# Patient Record
Sex: Female | Born: 1935 | Race: White | Hispanic: No | Smoking: Former smoker
Health system: Southern US, Community
[De-identification: ages and names within clinical notes are randomized; demographics above are authoritative.]

## PROBLEM LIST (undated history)

## (undated) DIAGNOSIS — C801 Malignant (primary) neoplasm, unspecified: Secondary | ICD-10-CM

## (undated) DIAGNOSIS — J189 Pneumonia, unspecified organism: Secondary | ICD-10-CM

## (undated) DIAGNOSIS — E785 Hyperlipidemia, unspecified: Secondary | ICD-10-CM

## (undated) DIAGNOSIS — J449 Chronic obstructive pulmonary disease, unspecified: Secondary | ICD-10-CM

## (undated) DIAGNOSIS — R55 Syncope and collapse: Secondary | ICD-10-CM

## (undated) DIAGNOSIS — R9431 Abnormal electrocardiogram [ECG] [EKG]: Secondary | ICD-10-CM

## (undated) DIAGNOSIS — I5189 Other ill-defined heart diseases: Secondary | ICD-10-CM

## (undated) DIAGNOSIS — D649 Anemia, unspecified: Secondary | ICD-10-CM

## (undated) DIAGNOSIS — E119 Type 2 diabetes mellitus without complications: Secondary | ICD-10-CM

## (undated) DIAGNOSIS — H3500 Unspecified background retinopathy: Secondary | ICD-10-CM

## (undated) DIAGNOSIS — E039 Hypothyroidism, unspecified: Secondary | ICD-10-CM

## (undated) DIAGNOSIS — I499 Cardiac arrhythmia, unspecified: Secondary | ICD-10-CM

## (undated) DIAGNOSIS — I6529 Occlusion and stenosis of unspecified carotid artery: Secondary | ICD-10-CM

## (undated) DIAGNOSIS — R51 Headache: Secondary | ICD-10-CM

## (undated) DIAGNOSIS — K51411 Inflammatory polyps of colon with rectal bleeding: Secondary | ICD-10-CM

## (undated) DIAGNOSIS — H9193 Unspecified hearing loss, bilateral: Secondary | ICD-10-CM

## (undated) DIAGNOSIS — I219 Acute myocardial infarction, unspecified: Secondary | ICD-10-CM

## (undated) DIAGNOSIS — R0602 Shortness of breath: Secondary | ICD-10-CM

## (undated) DIAGNOSIS — R42 Dizziness and giddiness: Secondary | ICD-10-CM

## (undated) DIAGNOSIS — IMO0002 Reserved for concepts with insufficient information to code with codable children: Secondary | ICD-10-CM

## (undated) HISTORY — PX: TONSILLECTOMY: SUR1361

## (undated) HISTORY — DX: Reserved for concepts with insufficient information to code with codable children: IMO0002

## (undated) HISTORY — PX: CAROTID ENDARTERECTOMY: SUR193

## (undated) HISTORY — DX: Unspecified background retinopathy: H35.00

## (undated) HISTORY — PX: HEMORRHOID SURGERY: SHX153

## (undated) HISTORY — DX: Chronic obstructive pulmonary disease, unspecified: J44.9

## (undated) HISTORY — PX: THROAT SURGERY: SHX803

## (undated) HISTORY — DX: Hyperlipidemia, unspecified: E78.5

## (undated) HISTORY — PX: ABDOMINAL HYSTERECTOMY: SHX81

## (undated) HISTORY — PX: COLONOSCOPY W/ POLYPECTOMY: SHX1380

## (undated) HISTORY — DX: Unspecified hearing loss, bilateral: H91.93

## (undated) HISTORY — DX: Anemia, unspecified: D64.9

## (undated) HISTORY — DX: Other ill-defined heart diseases: I51.89

## (undated) HISTORY — DX: Malignant (primary) neoplasm, unspecified: C80.1

## (undated) HISTORY — DX: Inflammatory polyps of colon with rectal bleeding: K51.411

## (undated) HISTORY — DX: Occlusion and stenosis of unspecified carotid artery: I65.29

## (undated) HISTORY — DX: Hypothyroidism, unspecified: E03.9

## (undated) HISTORY — DX: Type 2 diabetes mellitus without complications: E11.9

## (undated) HISTORY — PX: OTHER SURGICAL HISTORY: SHX169

## (undated) HISTORY — DX: Cardiac arrhythmia, unspecified: I49.9

## (undated) HISTORY — PX: EYE SURGERY: SHX253

---

## 2004-09-16 DIAGNOSIS — I219 Acute myocardial infarction, unspecified: Secondary | ICD-10-CM

## 2004-09-16 HISTORY — DX: Acute myocardial infarction, unspecified: I21.9

## 2006-03-28 ENCOUNTER — Ambulatory Visit: Payer: Self-pay | Admitting: Family Medicine

## 2006-04-11 ENCOUNTER — Ambulatory Visit: Payer: Self-pay | Admitting: Family Medicine

## 2006-04-16 ENCOUNTER — Ambulatory Visit: Payer: Self-pay | Admitting: Internal Medicine

## 2006-04-24 ENCOUNTER — Ambulatory Visit: Payer: Self-pay | Admitting: Internal Medicine

## 2006-04-30 ENCOUNTER — Ambulatory Visit: Payer: Self-pay | Admitting: Family Medicine

## 2006-04-30 LAB — CONVERTED CEMR LAB: Hgb A1c MFr Bld: 6.2 %

## 2006-06-24 DIAGNOSIS — J45909 Unspecified asthma, uncomplicated: Secondary | ICD-10-CM | POA: Insufficient documentation

## 2006-06-24 DIAGNOSIS — N393 Stress incontinence (female) (male): Secondary | ICD-10-CM | POA: Insufficient documentation

## 2006-06-24 DIAGNOSIS — M810 Age-related osteoporosis without current pathological fracture: Secondary | ICD-10-CM | POA: Insufficient documentation

## 2006-06-24 DIAGNOSIS — I10 Essential (primary) hypertension: Secondary | ICD-10-CM | POA: Insufficient documentation

## 2006-06-24 DIAGNOSIS — E118 Type 2 diabetes mellitus with unspecified complications: Secondary | ICD-10-CM

## 2006-06-24 DIAGNOSIS — E039 Hypothyroidism, unspecified: Secondary | ICD-10-CM | POA: Insufficient documentation

## 2006-06-24 DIAGNOSIS — Z794 Long term (current) use of insulin: Secondary | ICD-10-CM | POA: Insufficient documentation

## 2006-06-24 DIAGNOSIS — I252 Old myocardial infarction: Secondary | ICD-10-CM | POA: Insufficient documentation

## 2006-06-24 DIAGNOSIS — H9190 Unspecified hearing loss, unspecified ear: Secondary | ICD-10-CM | POA: Insufficient documentation

## 2006-08-28 ENCOUNTER — Telehealth: Payer: Self-pay | Admitting: Family Medicine

## 2006-08-29 ENCOUNTER — Encounter: Payer: Self-pay | Admitting: Family Medicine

## 2006-10-01 ENCOUNTER — Telehealth: Payer: Self-pay | Admitting: Family Medicine

## 2006-10-01 ENCOUNTER — Ambulatory Visit: Payer: Self-pay | Admitting: Family Medicine

## 2006-10-02 ENCOUNTER — Telehealth (INDEPENDENT_AMBULATORY_CARE_PROVIDER_SITE_OTHER): Payer: Self-pay | Admitting: *Deleted

## 2006-10-02 ENCOUNTER — Encounter: Payer: Self-pay | Admitting: Family Medicine

## 2006-11-03 ENCOUNTER — Telehealth: Payer: Self-pay | Admitting: Family Medicine

## 2006-11-26 ENCOUNTER — Encounter: Payer: Self-pay | Admitting: Family Medicine

## 2006-12-04 ENCOUNTER — Ambulatory Visit: Payer: Self-pay | Admitting: Family Medicine

## 2006-12-04 DIAGNOSIS — I209 Angina pectoris, unspecified: Secondary | ICD-10-CM | POA: Insufficient documentation

## 2006-12-04 DIAGNOSIS — J449 Chronic obstructive pulmonary disease, unspecified: Secondary | ICD-10-CM | POA: Insufficient documentation

## 2006-12-08 ENCOUNTER — Telehealth (INDEPENDENT_AMBULATORY_CARE_PROVIDER_SITE_OTHER): Payer: Self-pay | Admitting: *Deleted

## 2007-01-18 ENCOUNTER — Encounter: Payer: Self-pay | Admitting: Family Medicine

## 2007-01-29 ENCOUNTER — Encounter: Payer: Self-pay | Admitting: Family Medicine

## 2007-02-04 ENCOUNTER — Encounter: Payer: Self-pay | Admitting: Family Medicine

## 2007-02-04 DIAGNOSIS — E113299 Type 2 diabetes mellitus with mild nonproliferative diabetic retinopathy without macular edema, unspecified eye: Secondary | ICD-10-CM | POA: Insufficient documentation

## 2007-02-12 ENCOUNTER — Encounter: Payer: Self-pay | Admitting: Family Medicine

## 2007-02-18 ENCOUNTER — Ambulatory Visit: Payer: Self-pay | Admitting: Family Medicine

## 2007-04-13 ENCOUNTER — Ambulatory Visit: Payer: Self-pay | Admitting: Family Medicine

## 2007-08-28 ENCOUNTER — Ambulatory Visit: Payer: Self-pay | Admitting: Family Medicine

## 2007-08-31 ENCOUNTER — Encounter: Payer: Self-pay | Admitting: Family Medicine

## 2007-09-01 ENCOUNTER — Encounter: Payer: Self-pay | Admitting: Family Medicine

## 2007-09-01 ENCOUNTER — Telehealth (INDEPENDENT_AMBULATORY_CARE_PROVIDER_SITE_OTHER): Payer: Self-pay | Admitting: *Deleted

## 2007-09-01 LAB — CONVERTED CEMR LAB
AST: 16 units/L (ref 0–37)
Alkaline Phosphatase: 72 units/L (ref 39–117)
BUN: 24 mg/dL — ABNORMAL HIGH (ref 6–23)
Creatinine, Ser: 1.15 mg/dL (ref 0.40–1.20)
HCT: 36.5 % (ref 36.0–46.0)
Hemoglobin: 11.6 g/dL — ABNORMAL LOW (ref 12.0–15.0)
MCHC: 31.8 g/dL (ref 30.0–36.0)
Platelets: 252 10*3/uL (ref 150–400)
Potassium: 4.7 meq/L (ref 3.5–5.3)
RDW: 13 % (ref 11.5–15.5)

## 2007-09-02 LAB — CONVERTED CEMR LAB: Ferritin: 32 ng/mL (ref 10–291)

## 2007-09-28 ENCOUNTER — Ambulatory Visit: Payer: Self-pay | Admitting: Family Medicine

## 2007-10-19 ENCOUNTER — Telehealth (INDEPENDENT_AMBULATORY_CARE_PROVIDER_SITE_OTHER): Payer: Self-pay | Admitting: *Deleted

## 2007-10-19 ENCOUNTER — Encounter: Payer: Self-pay | Admitting: Family Medicine

## 2007-10-21 ENCOUNTER — Encounter: Admission: RE | Admit: 2007-10-21 | Discharge: 2007-11-18 | Payer: Self-pay | Admitting: Family Medicine

## 2007-11-17 ENCOUNTER — Telehealth: Payer: Self-pay | Admitting: Family Medicine

## 2007-12-02 LAB — HM DIABETES EYE EXAM: HM Diabetic Eye Exam: NORMAL

## 2007-12-23 ENCOUNTER — Ambulatory Visit: Payer: Self-pay | Admitting: Family Medicine

## 2007-12-23 LAB — CONVERTED CEMR LAB: Hgb A1c MFr Bld: 6.8 %

## 2008-02-01 ENCOUNTER — Encounter: Payer: Self-pay | Admitting: Family Medicine

## 2008-02-04 ENCOUNTER — Telehealth: Payer: Self-pay | Admitting: Family Medicine

## 2008-02-12 ENCOUNTER — Encounter: Payer: Self-pay | Admitting: Family Medicine

## 2008-02-19 ENCOUNTER — Ambulatory Visit: Payer: Self-pay | Admitting: Family Medicine

## 2008-03-02 ENCOUNTER — Encounter (INDEPENDENT_AMBULATORY_CARE_PROVIDER_SITE_OTHER): Payer: Self-pay | Admitting: Surgery

## 2008-03-02 ENCOUNTER — Ambulatory Visit (HOSPITAL_COMMUNITY): Admission: RE | Admit: 2008-03-02 | Discharge: 2008-03-03 | Payer: Self-pay | Admitting: Surgery

## 2008-04-04 ENCOUNTER — Ambulatory Visit: Payer: Self-pay | Admitting: Family Medicine

## 2008-04-05 ENCOUNTER — Encounter: Payer: Self-pay | Admitting: Family Medicine

## 2008-04-13 ENCOUNTER — Ambulatory Visit: Payer: Self-pay | Admitting: Family Medicine

## 2008-04-13 DIAGNOSIS — R413 Other amnesia: Secondary | ICD-10-CM

## 2008-04-13 DIAGNOSIS — F0390 Unspecified dementia without behavioral disturbance: Secondary | ICD-10-CM | POA: Insufficient documentation

## 2008-04-15 ENCOUNTER — Telehealth (INDEPENDENT_AMBULATORY_CARE_PROVIDER_SITE_OTHER): Payer: Self-pay | Admitting: *Deleted

## 2008-04-15 ENCOUNTER — Encounter: Payer: Self-pay | Admitting: Family Medicine

## 2008-04-15 DIAGNOSIS — N183 Chronic kidney disease, stage 3 unspecified: Secondary | ICD-10-CM | POA: Insufficient documentation

## 2008-04-15 LAB — CONVERTED CEMR LAB
Albumin: 4.3 g/dL (ref 3.5–5.2)
Alkaline Phosphatase: 73 units/L (ref 39–117)
CO2: 24 meq/L (ref 19–32)
Chloride: 107 meq/L (ref 96–112)
Glucose, Bld: 46 mg/dL — ABNORMAL LOW (ref 70–99)
Potassium: 5.1 meq/L (ref 3.5–5.3)
RBC: 3.94 M/uL (ref 3.87–5.11)
Sodium: 141 meq/L (ref 135–145)
TSH: 2.063 microintl units/mL (ref 0.350–4.50)
Total Protein: 7.3 g/dL (ref 6.0–8.3)
UIBC: 260 ug/dL
Vitamin B-12: 558 pg/mL (ref 211–911)
WBC: 6.5 10*3/uL (ref 4.0–10.5)

## 2008-09-30 ENCOUNTER — Telehealth: Payer: Self-pay | Admitting: Family Medicine

## 2008-10-07 ENCOUNTER — Ambulatory Visit: Payer: Self-pay | Admitting: Family Medicine

## 2008-10-07 LAB — CONVERTED CEMR LAB: Glucose, Bld: 217 mg/dL

## 2008-10-17 ENCOUNTER — Encounter: Payer: Self-pay | Admitting: Family Medicine

## 2008-10-20 ENCOUNTER — Encounter: Payer: Self-pay | Admitting: Family Medicine

## 2008-11-22 ENCOUNTER — Encounter: Payer: Self-pay | Admitting: Family Medicine

## 2008-12-22 ENCOUNTER — Ambulatory Visit: Payer: Self-pay | Admitting: Family Medicine

## 2008-12-23 ENCOUNTER — Encounter: Payer: Self-pay | Admitting: Family Medicine

## 2009-01-27 ENCOUNTER — Encounter: Payer: Self-pay | Admitting: Family Medicine

## 2009-02-08 ENCOUNTER — Encounter: Payer: Self-pay | Admitting: Family Medicine

## 2009-02-24 ENCOUNTER — Ambulatory Visit: Payer: Self-pay | Admitting: Family Medicine

## 2009-02-27 LAB — CONVERTED CEMR LAB
ALT: 13 units/L (ref 0–35)
CO2: 24 meq/L (ref 19–32)
Cholesterol: 170 mg/dL (ref 0–200)
Hgb A1c MFr Bld: 6.2 % — ABNORMAL HIGH (ref 4.6–6.1)
LDL Cholesterol: 89 mg/dL (ref 0–99)
Sodium: 144 meq/L (ref 135–145)
Total Bilirubin: 0.3 mg/dL (ref 0.3–1.2)
Total Protein: 6.9 g/dL (ref 6.0–8.3)
VLDL: 22 mg/dL (ref 0–40)

## 2009-03-13 ENCOUNTER — Telehealth: Payer: Self-pay | Admitting: Family Medicine

## 2009-03-16 ENCOUNTER — Encounter: Payer: Self-pay | Admitting: Family Medicine

## 2009-03-27 ENCOUNTER — Ambulatory Visit: Payer: Self-pay | Admitting: Family Medicine

## 2009-03-28 LAB — CONVERTED CEMR LAB
Glucose, Bld: 276 mg/dL — ABNORMAL HIGH (ref 70–99)
Potassium: 5 meq/L (ref 3.5–5.3)
Sodium: 138 meq/L (ref 135–145)

## 2009-04-20 ENCOUNTER — Telehealth: Payer: Self-pay | Admitting: Family Medicine

## 2009-04-20 ENCOUNTER — Ambulatory Visit: Payer: Self-pay | Admitting: Family Medicine

## 2009-05-04 ENCOUNTER — Encounter: Payer: Self-pay | Admitting: Family Medicine

## 2009-05-30 ENCOUNTER — Ambulatory Visit: Payer: Self-pay | Admitting: Family Medicine

## 2009-05-30 LAB — CONVERTED CEMR LAB: Hgb A1c MFr Bld: 6.9 %

## 2009-07-18 ENCOUNTER — Telehealth: Payer: Self-pay | Admitting: Family Medicine

## 2009-09-14 ENCOUNTER — Telehealth: Payer: Self-pay | Admitting: Family Medicine

## 2009-09-28 ENCOUNTER — Encounter: Payer: Self-pay | Admitting: Family Medicine

## 2009-10-30 ENCOUNTER — Telehealth: Payer: Self-pay | Admitting: Family Medicine

## 2009-11-07 ENCOUNTER — Ambulatory Visit: Payer: Self-pay | Admitting: Family Medicine

## 2009-11-07 ENCOUNTER — Telehealth (INDEPENDENT_AMBULATORY_CARE_PROVIDER_SITE_OTHER): Payer: Self-pay | Admitting: *Deleted

## 2009-11-07 LAB — CONVERTED CEMR LAB
Bilirubin Urine: NEGATIVE
Glucose, Urine, Semiquant: NEGATIVE
Nitrite: NEGATIVE

## 2010-06-18 ENCOUNTER — Ambulatory Visit: Payer: Self-pay | Admitting: Family Medicine

## 2010-06-18 LAB — CONVERTED CEMR LAB
Creatinine,U: 300 mg/dL
Hgb A1c MFr Bld: 6.4 %
Microalbumin U total vol: 150 mg/L

## 2010-06-21 ENCOUNTER — Encounter: Payer: Self-pay | Admitting: Family Medicine

## 2010-06-21 LAB — CONVERTED CEMR LAB
AST: 21 units/L (ref 0–37)
Alkaline Phosphatase: 67 units/L (ref 39–117)
BUN: 39 mg/dL — ABNORMAL HIGH (ref 6–23)
Creatinine, Ser: 1.5 mg/dL — ABNORMAL HIGH (ref 0.40–1.20)
Glucose, Bld: 104 mg/dL — ABNORMAL HIGH (ref 70–99)
HDL: 58 mg/dL (ref >39)
LDL Cholesterol: 68 mg/dL (ref 0–99)
TSH: 1.72 microintl units/mL (ref 0.350–4.500)
Total CHOL/HDL Ratio: 2.5
Triglycerides: 103 mg/dL (ref <150)

## 2010-07-07 ENCOUNTER — Encounter: Payer: Self-pay | Admitting: Family Medicine

## 2010-07-12 ENCOUNTER — Ambulatory Visit: Payer: Self-pay | Admitting: Family Medicine

## 2010-07-12 DIAGNOSIS — R42 Dizziness and giddiness: Secondary | ICD-10-CM | POA: Insufficient documentation

## 2010-07-13 ENCOUNTER — Encounter: Payer: Self-pay | Admitting: Family Medicine

## 2010-07-13 LAB — CONVERTED CEMR LAB
BUN: 39 mg/dL — ABNORMAL HIGH (ref 6–23)
CO2: 24 meq/L (ref 19–32)
Calcium: 9.2 mg/dL (ref 8.4–10.5)
Creatinine, Ser: 1.86 mg/dL — ABNORMAL HIGH (ref 0.40–1.20)

## 2010-07-20 ENCOUNTER — Encounter: Payer: Self-pay | Admitting: Family Medicine

## 2010-07-25 ENCOUNTER — Telehealth: Payer: Self-pay | Admitting: Family Medicine

## 2010-08-03 ENCOUNTER — Encounter (INDEPENDENT_AMBULATORY_CARE_PROVIDER_SITE_OTHER): Payer: Self-pay | Admitting: *Deleted

## 2010-08-07 ENCOUNTER — Telehealth: Payer: Self-pay | Admitting: Family Medicine

## 2010-08-24 ENCOUNTER — Encounter: Payer: Self-pay | Admitting: Family Medicine

## 2010-08-28 ENCOUNTER — Encounter: Payer: Self-pay | Admitting: Family Medicine

## 2010-08-29 ENCOUNTER — Encounter: Payer: Self-pay | Admitting: Family Medicine

## 2010-09-14 ENCOUNTER — Encounter: Payer: Self-pay | Admitting: Family Medicine

## 2010-09-14 ENCOUNTER — Ambulatory Visit: Payer: Self-pay | Admitting: Family Medicine

## 2010-09-14 LAB — CONVERTED CEMR LAB
Protein, U semiquant: NEGATIVE
pH: 6.5

## 2010-09-15 ENCOUNTER — Encounter: Payer: Self-pay | Admitting: Family Medicine

## 2010-09-17 LAB — CONVERTED CEMR LAB
ALT: 25 units/L (ref 0–35)
AST: 27 units/L (ref 0–37)
Alkaline Phosphatase: 81 units/L (ref 39–117)
Basophils Absolute: 0.1 10*3/uL (ref 0.0–0.1)
Basophils Relative: 1 % (ref 0–1)
Creatinine, Ser: 1.66 mg/dL — ABNORMAL HIGH (ref 0.40–1.20)
Eosinophils Absolute: 0.2 10*3/uL (ref 0.0–0.7)
Eosinophils Relative: 2 % (ref 0–5)
HCT: 35.9 % — ABNORMAL LOW (ref 36.0–46.0)
Hemoglobin: 11.8 g/dL — ABNORMAL LOW (ref 12.0–15.0)
Lymphocytes Relative: 20 % (ref 12–46)
MCHC: 32.9 g/dL (ref 30.0–36.0)
MCV: 92.5 fL (ref 78.0–100.0)
Monocytes Absolute: 0.6 10*3/uL (ref 0.1–1.0)
Platelets: 221 10*3/uL (ref 150–400)
RDW: 12.5 % (ref 11.5–15.5)
Sodium: 137 meq/L (ref 135–145)
Total Bilirubin: 0.3 mg/dL (ref 0.3–1.2)
Total Protein: 6.5 g/dL (ref 6.0–8.3)

## 2010-09-19 ENCOUNTER — Telehealth: Payer: Self-pay | Admitting: Family Medicine

## 2010-09-21 ENCOUNTER — Ambulatory Visit
Admission: RE | Admit: 2010-09-21 | Discharge: 2010-09-21 | Payer: Self-pay | Source: Home / Self Care | Attending: Family Medicine | Admitting: Family Medicine

## 2010-09-21 DIAGNOSIS — I959 Hypotension, unspecified: Secondary | ICD-10-CM | POA: Insufficient documentation

## 2010-10-02 ENCOUNTER — Ambulatory Visit
Admission: RE | Admit: 2010-10-02 | Discharge: 2010-10-02 | Payer: Self-pay | Source: Home / Self Care | Attending: Family Medicine | Admitting: Family Medicine

## 2010-10-02 DIAGNOSIS — R079 Chest pain, unspecified: Secondary | ICD-10-CM | POA: Insufficient documentation

## 2010-10-08 ENCOUNTER — Telehealth (INDEPENDENT_AMBULATORY_CARE_PROVIDER_SITE_OTHER): Payer: Self-pay | Admitting: *Deleted

## 2010-10-16 ENCOUNTER — Encounter: Payer: Self-pay | Admitting: Family Medicine

## 2010-10-16 ENCOUNTER — Ambulatory Visit
Admission: RE | Admit: 2010-10-16 | Discharge: 2010-10-16 | Payer: Self-pay | Source: Home / Self Care | Attending: Family Medicine | Admitting: Family Medicine

## 2010-10-16 LAB — CONVERTED CEMR LAB: Hgb A1c MFr Bld: 8.1 %

## 2010-10-16 NOTE — Assessment & Plan Note (Signed)
Summary: Sinusitis, urinary freq.   Vital Signs:  Patient profile:   77 year old female Height:      63 inches Weight:      156 pounds BMI:     27.73 O2 Sat:      94 % on Room air Temp:     98.6 degrees F oral Pulse rate:   87 / minute BP sitting:   117 / 52  (left arm) Cuff size:   regular  Vitals Entered By: Kathlene November (November 07, 2009 3:16 PM)  O2 Flow:  Room air CC: head congestion, cough, H/A, chills and sweats, hurting in rib area when takes a deep breath- started Thursday   Primary Care Provider:  Nani Gasser MD  CC:  head congestion, cough, H/A, chills and sweats, and hurting in rib area when takes a deep breath- started Thursday.  History of Present Illness: head congestion, cough, H/A, chills and sweats, hurting in rib area when takes a deep breath- started Thursday.  Sxs started about 7 dats ago. Initally had green nasal discharge and now white d/c. No fever. Took some IBU and cepacol and diabetic cough medicine.  No GI sxs.  Mild ST.  No ear pain.  Horseness started today adn feels worse today.    Current Medications (verified): 1)  Advair Diskus 250-50 Mcg/dose Misc (Fluticasone-Salmeterol) .... One Inhalation Twice Daily 2)  Proair Hfa 108 (90 Base) Mcg/act Aers (Albuterol Sulfate) .... 2 -4 Puffs Inhaled Every 4 Hours As Needed Sob. 3)  Aspirin 81 Mg Tbec (Aspirin) .... Take 1 Tablet By Mouth Once A Day 4)  Atrovent Hfa 17 Mcg/act Aers (Ipratropium Bromide Hfa) .... 2 Puffs 4x's Daily 5)  Caltrate 600+d  Tabs (Calcium Carbonate-Vitamin D Tabs) .... Take 1 Tablet By Mouth Once A Day 6)  Glucotrol Xl 5 Mg Xr24h-Tab (Glipizide) .... Take 1 Tablet By Mouth Once A Day 7)  Lisinopril 10 Mg Tabs (Lisinopril) .... Take 1 Tablet By Mouth Once A Day 8)  Loratadine 10 Mg Tabs (Loratadine) .... Take 1 Tablet By Mouth Once A Day 9)  Multivitamins  Tabs (Multiple Vitamin) .... Take 1 Tablet By Mouth Once A Day 10)  Nitroglycerin 0.4 Mg Subl (Nitroglycerin) .... As  Needed Chest Pain 11)  Sertraline Hcl 100 Mg Tabs (Sertraline Hcl) .... Take 1 Tablet By Mouth Once A Day 12)  Synthroid 50 Mcg Tabs (Levothyroxine Sodium) .... Take 1 Tablet By Mouth Once A Day 13)  Triamcinolone Acetonide 0.1 % Crea (Triamcinolone Acetonide) .... Apply To Heals As Needed 14)  Tylenol Arthritis Pain 650 Mg Tbcr (Acetaminophen) .... As Needed 15)  Zocor 40 Mg Tabs (Simvastatin) .... One By Mouth At Bedtime 16)  True Track Glucometer and Test Strips .... Dx 250.00 Test 2 X A Day 17)  Albuterol Sulfate (2.5 Mg/44ml) 0.083% Nebu (Albuterol Sulfate) .... Neb Inhaled Every 4 Hours As Need For Sob 18)  Ferrous Sulfate 324 Mg  Tbec (Ferrous Sulfate) .[name].. 1 Tab By Mouth Daily 19)  Darvocet-N 100 100-650 Mg  Tabs (Propoxyphene N-Apap) .[name].. 1 Tab By Mouth Three Times A Day As Needed Back Pain 20)  Metformin Hcl 500 Mg Xr24h-Tab (Metformin Hcl) .... Take 1 Tablet By Mouth Once A Day 21)  Shower Chair .... Dx. Lower Extremity Weakness. 22)  Epipen 2-Pak 0.3 Mg/0.83ml (1:1000) Devi (Epinephrine Hcl (Anaphylaxis)) .... Use As Directed  Allergies (verified): 1)  ! Tetracycline  Comments:  Nurse/Medical Assistant: The patient's medications and allergies were reviewed with the  patient and were updated in the Medication and Allergy Lists. Kathlene November (November 07, 2009 3:17 PM)  Physical Exam  General:  Well-developed,well-nourished,in no acute distress; alert,appropriate and cooperative throughout examination Head:  Normocephalic and atraumatic without obvious abnormalities. No apparent alopecia or balding. Eyes:  No corneal or conjunctival inflammation noted. EOMI. Perrla.  Ears:  External ear exam shows no significant lesions or deformities.  Otoscopic examination reveals clear canals, tympanic membranes are intact bilaterally without bulging, retraction, inflammation or discharge. Hearing is grossly normal bilaterally. Nose:  External nasal examination shows no deformity or  inflammation. Nasal mucosa are pink and moist without lesions or exudates. Mouth:  Oral mucosa and oropharynx without lesions or exudates.  Teeth in good repair. Neck:  No deformities, masses, or tenderness noted. Lungs:  Normal respiratory effort, chest expands symmetrically. Lungs are clear to auscultation, no crackles or wheezes. Heart:  Normal rate and regular rhythm. S1 and S2 normal without gallop, murmur, click, rub or other extra sounds. Skin:  no rashes.   Cervical Nodes:  No lymphadenopathy noted Psych:  Cognition and judgment appear intact. Alert and cooperative with normal attention span and concentration. No apparent delusions, illusions, hallucinations   Impression & Recommendations:  Problem # 1:  SINUSITIS - ACUTE-NOS (ICD-461.9)  Instructed on treatment. Call if symptoms persist or worsen.   Her updated medication list for this problem includes:    Amoxicillin 500 Mg Cap (Amoxicillin) .[name]... Take 1 capsule by mouth three times a day x 10 days    Cipro 500 Mg Tabs (Ciprofloxacin hcl) .[name]... Take 1 tablet by mouth two times a day for 3 days  Problem # 2:  FREQUENCY, URINARY (ICD-788.41)  Orders: UA Dipstick w/o Micro (automated)  (81003)  Discussed use of medication. If sxs don't resolve then f/u for furthe evaluation for possible overactive bladder.   Complete Medication List: 1)  Advair Diskus 250-50 Mcg/dose Misc (Fluticasone-salmeterol) .... One inhalation twice daily 2)  Proair Hfa 108 (90 Base) Mcg/act Aers (Albuterol sulfate) .... 2 -4 puffs inhaled every 4 hours as needed sob. 3)  Aspirin 81 Mg Tbec (Aspirin) .... Take 1 tablet by mouth once a day 4)  Atrovent Hfa 17 Mcg/act Aers (Ipratropium bromide hfa) .... 2 puffs 4x's daily 5)  Caltrate 600+d Tabs (Calcium carbonate-vitamin d tabs) .... Take 1 tablet by mouth once a day 6)  Glucotrol Xl 5 Mg Xr24h-tab (Glipizide) .... Take 1 tablet by mouth once a day 7)  Lisinopril 10 Mg Tabs (Lisinopril) .... Take 1  tablet by mouth once a day 8)  Loratadine 10 Mg Tabs (Loratadine) .... Take 1 tablet by mouth once a day 9)  Multivitamins Tabs (Multiple vitamin) .... Take 1 tablet by mouth once a day 10)  Nitroglycerin 0.4 Mg Subl (Nitroglycerin) .... As needed chest pain 11)  Sertraline Hcl 100 Mg Tabs (Sertraline hcl) .... Take 1 tablet by mouth once a day 12)  Synthroid 50 Mcg Tabs (Levothyroxine sodium) .... Take 1 tablet by mouth once a day 13)  Triamcinolone Acetonide 0.1 % Crea (Triamcinolone acetonide) .... Apply to heals as needed 14)  Tylenol Arthritis Pain 650 Mg Tbcr (Acetaminophen) .... As needed 15)  Zocor 40 Mg Tabs (Simvastatin) .... One by mouth at bedtime 16)  True Track Glucometer and Test Strips  .... Dx 250.00 test 2 x a day 17)  Albuterol Sulfate (2.5 Mg/59ml) 0.083% Nebu (Albuterol sulfate) .... Neb inhaled every 4 hours as need for sob 18)  Ferrous Sulfate 324 Mg Tbec (  Ferrous sulfate) .[name].. 1 tab by mouth daily 19)  Darvocet-n 100 100-650 Mg Tabs (Propoxyphene n-apap) .[name].. 1 tab by mouth three times a day as needed back pain 20)  Metformin Hcl 500 Mg Xr24h-tab (Metformin hcl) .... Take 1 tablet by mouth once a day 21)  Paediatric nurse  .... Dx. lower extremity weakness. 22)  Epipen 2-pak 0.3 Mg/0.82ml (1:1000) Devi (Epinephrine hcl (anaphylaxis)) .... Use as directed 23)  Amoxicillin 500 Mg Cap (Amoxicillin) .... Take 1 capsule by mouth three times a day x 10 days 24)  Cipro 500 Mg Tabs (Ciprofloxacin hcl) .... Take 1 tablet by mouth two times a day for 3 days Prescriptions: CIPRO 500 MG TABS (CIPROFLOXACIN HCL) Take 1 tablet by mouth two times a day for 3 days  #6 x 0   Entered and Authorized by:   Nani Gasser MD   Signed by:   Nani Gasser MD on 11/07/2009   Method used:   Electronically to        [facility]* (retail)       [address]       [place], [place]  [address]       Ph: [phone]       Fax: [phone]   RxID:    [id number] AMOXICILLIN 500 MG CAP (AMOXICILLIN) Take 1 capsule by mouth three times a day X 10 days  #30 x 0   Entered and Authorized by:   Nani Gasser MD   Signed by:   Nani Gasser MD on 11/07/2009   Method used:   Electronically to        [facility]* (retail)       [address]       [place], [place]  [address]       Ph: [phone]       Fax: [phone]   RxID:   [id number]   Laboratory Results   Urine Tests  Date/Time Received: 11/07/2009 Date/Time Reported: 11/07/2009  Routine Urinalysis   Color: yellow Appearance: Clear Glucose: negative   (Normal Range: Negative) Bilirubin: negative   (Normal Range: Negative) Ketone: negative   (Normal Range: Negative) Spec. Gravity: 1.015   (Normal Range: 1.003-1.035) Blood: small   (Normal Range: Negative) pH: 5.5   (Normal Range: 5.0-8.0) Protein: negative   (Normal Range: Negative) Urobilinogen: 0.2   (Normal Range: 0-1) Nitrite: negative   (Normal Range: Negative) Leukocyte Esterace: moderate   (Normal Range: Negative)

## 2010-10-16 NOTE — Letter (Signed)
Summary: Generic Letter  [facility]  [address]   [place], [place] [address]   Phone: [phone]  Fax: [phone]    08/03/2010  [facility] [address] [place], [place]  [address]  Dear Theresa Huang,    We have been unable to reach you by phone. We have important information in reguards recent test that you have had done.Please call the office when you recieve this letter in the mail so that we may assist you with scheduling further appointment and so that we may update your contact information.       Sincerely,   Nani Gasser, MD

## 2010-10-16 NOTE — Medication Information (Signed)
Summary: Diabetes Supplies/[facility]  Diabetes Supplies/[facility]   Imported By: Lanelle Bal 11/01/2008 12:09:39  _____________________________________________________________________  External Attachment:    Type:   Image     Comment:   External Document

## 2010-10-16 NOTE — Progress Notes (Signed)
Summary: Carotid dopplers  Phone Note Outgoing Call   Summary of Call: Call pt: scan of neck did show some narrowing in the right carotid. I would like for CV surgeon to see her. [facility] prefer[facility] ([facility] Echo of heart is OK. Slight abnormal relaxtion of the heart but OK.  Valves are working well.  Initial call taken by: Nani Gasser MD,  July 25, 2010 1:32 PM  Follow-up for Phone Call        left message to call back Follow-up by: Avon Gully CMA, Duncan Dull),  July 26, 2010 8:31 AM  Additional Follow-up for Phone Call Additional follow up Details #1::        will have to mail pt a letter.the number on file was incorrect Additional Follow-up by: Avon Gully CMA, Duncan Dull),  August 03, 2010 2:14 PM        Past History:  Past Medical History: Degenerative Disc Disease HOSPitalized in 1/07-PNA Hx rectal polyps(non cancerous) and mixed hemorrhoids Left eye retinopathy (disc hemorrhage) Wears bilat hearing aids Glaucoma.  MMSE 7-09 27/30 (passing is 27/30) Echo 07/2010 diastolic dysfunction, EF 55%.  performed at [facility]

## 2010-10-16 NOTE — Progress Notes (Signed)
Summary: Sick  Phone Note Call from Patient Call back at [facility] Phone [phone] Call back at [phone]   Caller: Daughter Call For: Nani Gasser MD Summary of Call: Sick since Friday- cough, SOB, congestion head and chest, denies fever, H/A.  Initial call taken by: Kathlene November,  November 07, 2009 10:01 AM  Follow-up for Phone Call        See if can come in at 3:15.  Follow-up by: Nani Gasser MD,  November 07, 2009 11:20 AM

## 2010-10-16 NOTE — Letter (Signed)
Summary: [facility]  [facility]   Imported By: Lanelle Bal 01/22/2010 09:51:53  _____________________________________________________________________  External Attachment:    Type:   Image     Comment:   External Document

## 2010-10-16 NOTE — Progress Notes (Signed)
Summary: surgeon  Phone Note Call from Patient   Caller: Daughter Call For: Theresa Gasser MD Summary of Call: daughter recieved letter in the mail and called back. Pt needs to be set up for CV surgeon and does prefer [facility]. Initial call taken by: Avon Gully CMA, Duncan Dull),  August 07, 2010 4:48 PM

## 2010-10-16 NOTE — Progress Notes (Signed)
Summary: meds  Phone Note Call from Patient Call back at Home Phone [phone]   Caller: Daughter Call For: Semisi Biela Summary of Call: Pt daughter called and states that her mother took all doses of meds for the day and for today as well. Had diarrhea last night this morning feels ok. Asked her what blood sugar was this mornign and it was fine at 91 BP ok. So instruted pt to take nightly dose of meds as scheduled and continue tomorrow on her normal schedule Initial call taken by: Kathlene November,  October 30, 2009 10:30 AM  Follow-up for Phone Call        agree. Follow-up by: Seymour Bars DO,  October 30, 2009 11:28 AM

## 2010-10-16 NOTE — Assessment & Plan Note (Signed)
Summary: COPD, DM, etc   Vital Signs:  Patient profile:   77 year old female Height:      63 inches Weight:      163 pounds Pulse rate:   58 / minute BP sitting:   105 / 52  (right arm) Cuff size:   regular  Vitals Entered By: Avon Gully CMA, Duncan Dull) (June 18, 2010 2:07 PM) CC: f/u meds   Primary Care Provider:  Nani Gasser MD  CC:  f/u meds.  History of Present Illness: Has been SOB last 3 days. On her Adviar and has beeen using her inhaler 3-4 x a day. Has been wearing her oxygen during the day some.  No fever.  No cough or URI sxs. Duaghter says maybe a dry cough.  Got her flu shot 2 weeks ago.   Diabetes Management History:      The patient is a 77 years old female who comes in for evaluation of Type 2 Diabetes Mellitus.  She has not been enrolled in the "Diabetic Education Program".  She states understanding of dietary principles and is following her diet appropriately.  She is checking home blood sugars.  She says that she is exercising.  Type of exercise includes: walking.        There are no symptoms to suggest diabetic complications.  Since her last visit, no infections have occurred.  No changes have been made to her treatment plan since last visit.    Current Medications (verified): 1)  Advair Diskus 250-50 Mcg/dose Misc (Fluticasone-Salmeterol) .... One Inhalation Twice Daily 2)  Proair Hfa 108 (90 Base) Mcg/act Aers (Albuterol Sulfate) .... 2 -4 Puffs Inhaled Every 4 Hours As Needed Sob. 3)  Aspirin 81 Mg Tbec (Aspirin) .... Take 1 Tablet By Mouth Once A Day 4)  Atrovent Hfa 17 Mcg/act Aers (Ipratropium Bromide Hfa) .... 2 Puffs 4x's Daily 5)  Caltrate 600+d  Tabs (Calcium Carbonate-Vitamin D Tabs) .... Take 1 Tablet By Mouth Once A Day 6)  Glucotrol Xl 5 Mg Xr24h-Tab (Glipizide) .... Take 1 Tablet By Mouth Once A Day 7)  Lisinopril 10 Mg Tabs (Lisinopril) .... Take 1 Tablet By Mouth Once A Day 8)  Loratadine 10 Mg Tabs (Loratadine) .... Take 1 Tablet By  Mouth Once A Day 9)  Multivitamins  Tabs (Multiple Vitamin) .... Take 1 Tablet By Mouth Once A Day 10)  Nitroglycerin 0.4 Mg Subl (Nitroglycerin) .... As Needed Chest Pain 11)  Sertraline Hcl 100 Mg Tabs (Sertraline Hcl) .... Take 1 Tablet By Mouth Once A Day 12)  Synthroid 50 Mcg Tabs (Levothyroxine Sodium) .... Take 1 Tablet By Mouth Once A Day 13)  Triamcinolone Acetonide 0.1 % Crea (Triamcinolone Acetonide) .... Apply To Heals As Needed 14)  Tylenol Arthritis Pain 650 Mg Tbcr (Acetaminophen) .... As Needed 15)  Zocor 40 Mg Tabs (Simvastatin) .... One By Mouth At Bedtime 16)  True Track Glucometer and Test Strips .... Dx 250.00 Test 2 X A Day 17)  Albuterol Sulfate (2.5 Mg/76ml) 0.083% Nebu (Albuterol Sulfate) .... Neb Inhaled Every 4 Hours As Need For Sob 18)  Ferrous Sulfate 324 Mg  Tbec (Ferrous Sulfate) .[name].. 1 Tab By Mouth Daily 19)  Darvocet-N 100 100-650 Mg  Tabs (Propoxyphene N-Apap) .[name].. 1 Tab By Mouth Three Times A Day As Needed Back Pain 20)  Metformin Hcl 500 Mg Xr24h-Tab (Metformin Hcl) .... Take 1 Tablet By Mouth Once A Day 21)  Shower Chair .... Dx. Lower Extremity Weakness. 22)  Epipen  2-Pak 0.3 Mg/0.14ml (1:1000) Devi (Epinephrine Hcl (Anaphylaxis)) .... Use As Directed  Allergies (verified): 1)  ! Tetracycline  Comments:  Nurse/Medical Assistant: The patient's medications and allergies were reviewed with the patient and were updated in the Medication and Allergy Lists. Avon Gully CMA, Duncan Dull) (June 18, 2010 2:08 PM)  Past History:  Past Medical History: Last updated: 04/13/2008 Degenerative Disc Disease HOSPitalized in 1/07-PNA Hx rectal polyps(non cancerous) and mixed hemorrhoids Left eye retinopathy (disc hemorrhage) Wears bilat hearing aids Glaucoma.  MMSE 7-09 27/30 (passing is 27/30)  Social History: Last updated: 09/28/2007 Retired Lawyer.  Lives with dtr and son-in-law.  Moved from [facility] 4/07.  Quit smoking 1989, no EtOH, no drugs, 3 caffeinated/day,  no reg exercise.  Physical Exam  General:  Well-developed,well-nourished,in no acute distress; alert,appropriate and cooperative throughout examination Head:  Normocephalic and atraumatic without obvious abnormalities. No apparent alopecia or balding. Eyes:  No corneal or conjunctival inflammation noted. EOMI. Perrla.  Ears:  External ear exam shows no significant lesions or deformities.  Otoscopic examination reveals clear canals, tympanic membranes are intact bilaterally without bulging, retraction, inflammation or discharge. Hearing is grossly normal bilaterally. Nose:  External nasal examination shows no deformity or inflammation.  Mouth:  Oral mucosa and oropharynx without lesions or exudates.  Neck:  No deformities, masses, or tenderness noted. Lungs:  Normal respiratory effort, chest expands symmetrically. Lungs are clear to auscultation, no crackles or wheezes. Heart:  Normal rate and regular rhythm. S1 and S2 normal without gallop, murmur, click, rub or other extra sounds. Skin:  no rashes.   Cervical Nodes:  No lymphadenopathy noted Psych:  Cognition and judgment appear intact. Alert and cooperative with normal attention span and concentration. No apparent delusions, illusions, hallucinations   Impression & Recommendations:  Problem # 1:  COPD (ICD-496) Assessment Deteriorated COPD exacerbation. She is some better today. Recommend to use her abluterol 4 x a day for 2 days adn then wean down as she is feeling better. If getting more SOB then call the ofifce and will treat wtih steroid. No sign of infection or volume overload on exam today. She says she is using her Advari and her atrovent regularly.  Her updated medication list for this problem includes:    Advair Diskus 250-50 Mcg/dose Misc (Fluticasone-salmeterol) ..... One inhalation twice daily    Proair Hfa 108 (90 Base) Mcg/act Aers (Albuterol sulfate) .[name]... 2 -4 puffs inhaled every 4 hours as needed sob.    Atrovent Hfa 17  Mcg/act Aers (Ipratropium bromide hfa) .[name]... 2 puffs 4x's daily    Albuterol Sulfate (2.5 Mg/78ml) 0.083% Nebu (Albuterol sulfate) ..... Neb inhaled every 4 hours as need for sob  Problem # 2:  DIABETES MELLITUS II, UNCOMPLICATED (ICD-250.00)  Well controlled.  Monitor for highs. Her hgish are directly related to her food intake.  She is doing well on her current regimen. f/u in 3 months. Overdue for labs.  Some protein in her urine but she is on an ACE.  Her updated medication list for this problem includes:    Aspirin 81 Mg Tbec (Aspirin) .[name]... Take 1 tablet by mouth once a day    Glucotrol Xl 5 Mg Xr24h-tab (Glipizide) .[name]... Take 1 tablet by mouth once a day    Lisinopril 10 Mg Tabs (Lisinopril) .[name]... Take 1 tablet by mouth once a day    Metformin Hcl 500 Mg Xr24h-tab (Metformin hcl) .[name]... Take 1 tablet by mouth once a day  Orders: T-Comprehensive Metabolic Panel ([id number]) T-Lipid Profile (  [phone]) Fingerstick (36416) Hgb A1C (32440NU) Creatinine  (27253) Urine Microalbumin (66440)  Labs Reviewed: Creat: 1.28 (03/27/2009)   Microalbumin: 150 (06/18/2010)  Last Eye Exam: normal (12/02/2007) Reviewed HgBA1c results: 6.4 (06/18/2010)  6.9 (05/30/2009)  Problem # 3:  HYPOTHYROIDISM, UNSPECIFIED (ICD-244.9) Due to check levels. She is currently asymptomatic.  Her updated medication list for this problem includes:    Synthroid 50 Mcg Tabs (Levothyroxine sodium) .[name]... Take 1 tablet by mouth once a day  Orders: T-TSH ([id number])  Complete Medication List: 1)  Advair Diskus 250-50 Mcg/dose Misc (Fluticasone-salmeterol) .... One inhalation twice daily 2)  Proair Hfa 108 (90 Base) Mcg/act Aers (Albuterol sulfate) .... 2 -4 puffs inhaled every 4 hours as needed sob. 3)  Aspirin 81 Mg Tbec (Aspirin) .... Take 1 tablet by mouth once a day 4)  Atrovent Hfa 17 Mcg/act Aers (Ipratropium bromide hfa) .... 2 puffs 4x's daily 5)  Caltrate 600+d Tabs (Calcium carbonate-vitamin d tabs)  .... Take 1 tablet by mouth once a day 6)  Glucotrol Xl 5 Mg Xr24h-tab (Glipizide) .... Take 1 tablet by mouth once a day 7)  Lisinopril 10 Mg Tabs (Lisinopril) .... Take 1 tablet by mouth once a day 8)  Loratadine 10 Mg Tabs (Loratadine) .... Take 1 tablet by mouth once a day 9)  Multivitamins Tabs (Multiple vitamin) .... Take 1 tablet by mouth once a day 10)  Nitroglycerin 0.4 Mg Subl (Nitroglycerin) .... As needed chest pain 11)  Sertraline Hcl 100 Mg Tabs (Sertraline hcl) .... Take 1 tablet by mouth once a day 12)  Synthroid 50 Mcg Tabs (Levothyroxine sodium) .... Take 1 tablet by mouth once a day 13)  Triamcinolone Acetonide 0.1 % Crea (Triamcinolone acetonide) .... Apply to heals as needed 14)  Tylenol Arthritis Pain 650 Mg Tbcr (Acetaminophen) .... As needed 15)  Zocor 40 Mg Tabs (Simvastatin) .... One by mouth at bedtime 16)  True Track Glucometer and Test Strips  .... Dx 250.00 test 2 x a day 17)  Albuterol Sulfate (2.5 Mg/22ml) 0.083% Nebu (Albuterol sulfate) .... Neb inhaled every 4 hours as need for sob 18)  Ferrous Sulfate 324 Mg Tbec (Ferrous sulfate) .[name].. 1 tab by mouth daily 19)  Darvocet-n 100 100-650 Mg Tabs (Propoxyphene n-apap) .[name].. 1 tab by mouth three times a day as needed back pain 20)  Metformin Hcl 500 Mg Xr24h-tab (Metformin hcl) .... Take 1 tablet by mouth once a day 21)  Paediatric nurse  .... Dx. lower extremity weakness. 22)  Epipen 2-pak 0.3 Mg/0.44ml (1:1000) Devi (Epinephrine hcl (anaphylaxis)) .... Use as directed  Diabetes Management Assessment/Plan:      Her blood pressure goal is < 130/80.    Patient Instructions: 1)  Call if your breathing gets worse.  2)  We will call you with your lab resuls.  3)  Please schedule a follow-up appointment in 3 months .      Immunization History:  Influenza Immunization History:    Influenza:  historical (06/04/2010)    Laboratory Results   Urine Tests  Date/Time Received: 06/18/10 Date/Time Reported:  06/18/10  Microalbumin (urine): 150 mg/L Creatinine: 300mg /dL  A:C Ratio 87-564 mg/g  Blood Tests   Date/Time Received: 06/18/10 Date/Time Reported: 06/18/10  HGBA1C: 6.4%   (Normal Range: Non-Diabetic - 3-6%   Control Diabetic - 6-8%)

## 2010-10-16 NOTE — Assessment & Plan Note (Signed)
Summary: Hosp f/u for dizziness   Vital Signs:  Patient profile:   77 year old female Height:      63 inches Weight:      161 pounds BMI:     28.62 O2 Sat:      95 % on Room air Pulse rate:   86 / minute BP sitting:   117 / 56  (left arm) Cuff size:   regular  Vitals Entered By: Payton Spark CMA (July 12, 2010 1:05 PM)  O2 Flow:  Room air CC: Hosp F/U.   Primary Care Provider:  Nani Gasser MD  CC:  Hosp F/U.[name]  History of Present Illness: At the [facility] she was a few feet away from the car and passed out.  Went to [employer].  She had elevated her there and they had no reason for why dizzy. No LOC.  Felt dizzy and a fog went over her vision and fell. Had 6 sutures on her Left forehead from hitting her head. Had her sunglases on.  Did fall the day before .  Sugar are elevated teh last few days.  Per her records from [facility], did rule out for MI.  CT of neck showed some degernative changes. Head CT only showed mild chronic changes without acute changes. She has not had any more dizzy episodes since she has been home. She says she fractured her left ribs and painful to take in a deep breath but otherwise to CP or papitaitons.   Allergies: 1)  ! Tetracycline  Physical Exam  General:  Well-developed,well-nourished,in no acute distress; alert,appropriate and cooperative throughout examination Head:  Normocephalic and atraumatic without obvious abnormalities. No apparent alopecia or balding. Lungs:  Normal respiratory effort, chest expands symmetrically. Lungs are clear to auscultation, no crackles or wheezes. Heart:  Normal rate and regular rhythm. S1 and S2 normal without gallop, murmur, click, rub or other extra sounds. No caroti or adominal bruits.  Abdomen:  Bowel sounds positive,abdomen soft and non-tender without masses, organomegaly or hernias noted. Skin:  Bruise on her left upper back.  TEnder under teh left breast but no  bruising. Bruise and healing laceration over the left eye.  6 sutures were easily removed. No bleeding. Wound is clean. Triple ABX oint applied and bandaid placed over it.    Impression & Recommendations:  Problem # 1:  DIZZINESS (ICD-780.4) Encouragd her to stay hdyrated. I did review ED notes.  See HPI.  Recheck Cr and rule out thyroid d/o.  Will refer for echo and carotid dopplers.   Make sure staying well hydrated. Consider MRI of teh brain if further w/u is neg.  Her updated medication list for this problem includes:    Loratadine 10 Mg Tabs (Loratadine) .[name]... Take 1 tablet by mouth once a day  Orders: T-Basic Metabolic Panel [phone]) T-TSH [phone]) T-2-D Echo Complete (40347) T-Vascular Study-Carotids Complete (42595)  Problem # 2:  ENCOUNTER FOR REMOVAL OF SUTURES (ICD-V58.32)  6 sutures removed from left eyebrow ridge. F/U wound care reviwed. Use vaseline on the wound for 4 days and then start using mederma to help with the appearance of the scar.   Orders: Suture Removal by Non-Operative MD [phone])  Complete Medication List: 1)  Advair Diskus 250-50 Mcg/dose Misc (Fluticasone-salmeterol) .... One inhalation twice daily 2)  Proair Hfa 108 (90 Base) Mcg/act Aers (Albuterol sulfate) .... 2 -4 puffs inhaled every 4 hours as needed sob. 3)  Aspirin 81 Mg Tbec (Aspirin) .... Take 1 tablet  by mouth once a day 4)  Atrovent Hfa 17 Mcg/act Aers (Ipratropium bromide hfa) .... 2 puffs 4x's daily 5)  Caltrate 600+d Tabs (Calcium carbonate-vitamin d tabs) .... Take 1 tablet by mouth once a day 6)  Glucotrol Xl 5 Mg Xr24h-tab (Glipizide) .... Take 1 tablet by mouth once a day 7)  Lisinopril 10 Mg Tabs (Lisinopril) .... Take 1/2 tablet by mouth once a day 8)  Loratadine 10 Mg Tabs (Loratadine) .... Take 1 tablet by mouth once a day 9)  Multivitamins Tabs (Multiple vitamin) .... Take 1 tablet by mouth once a day 10)  Nitroglycerin 0.4 Mg Subl (Nitroglycerin) .... As needed  chest pain 11)  Sertraline Hcl 100 Mg Tabs (Sertraline hcl) .... Take 1 tablet by mouth once a day 12)  Synthroid 50 Mcg Tabs (Levothyroxine sodium) .... Take 1 tablet by mouth once a day 13)  Triamcinolone Acetonide 0.1 % Crea (Triamcinolone acetonide) .... Apply to heals as needed 14)  Tylenol Arthritis Pain 650 Mg Tbcr (Acetaminophen) .... As needed 15)  Zocor 40 Mg Tabs (Simvastatin) .... One by mouth at bedtime 16)  True Track Glucometer and Test Strips  .... Dx 250.00 test 2 x a day 17)  Albuterol Sulfate (2.5 Mg/29ml) 0.083% Nebu (Albuterol sulfate) .... Neb inhaled every 4 hours as need for sob 18)  Ferrous Sulfate 324 Mg Tbec (Ferrous sulfate) .[name].. 1 tab by mouth daily 19)  Darvocet-n 100 100-650 Mg Tabs (Propoxyphene n-apap) .[name].. 1 tab by mouth three times a day as needed back pain 20)  Metformin Hcl 500 Mg Xr24h-tab (Metformin hcl) .... Take 1 tablet by mouth once a day 21)  Paediatric nurse  .... Dx. lower extremity weakness. 22)  Epipen 2-pak 0.3 Mg/0.51ml (1:1000) Devi (Epinephrine hcl (anaphylaxis)) .... Use as directed  Patient Instructions: 1)  Take 2 tabs of metformin on the days when sugar is > 140 in the morning. 2)  We will call you with the referrals.  3)  Please schedule a follow-up appointment in 2 weeks.    Orders Added: 1)  T-Basic Metabolic Panel [[id number]] 2)  T-TSH [[id number]] 3)  Est. Patient Level IV [41324] 4)  Suture Removal by Non-Operative MD [S0630] 5)  T-2-D Echo Complete [93307] 6)  T-Vascular Study-Carotids Complete [93880]  Appended Document: Hosp f/u for dizziness

## 2010-10-17 LAB — CONVERTED CEMR LAB
ALT: 31 units/L (ref 0–35)
CO2: 25 meq/L (ref 19–32)
Calcium: 10.4 mg/dL (ref 8.4–10.5)
Chloride: 98 meq/L (ref 96–112)
Creatinine, Ser: 1.53 mg/dL — ABNORMAL HIGH (ref 0.40–1.20)
Glucose, Bld: 403 mg/dL — ABNORMAL HIGH (ref 70–99)
Total Bilirubin: 0.3 mg/dL (ref 0.3–1.2)

## 2010-10-18 ENCOUNTER — Encounter: Payer: Self-pay | Admitting: Family Medicine

## 2010-10-18 NOTE — Letter (Signed)
Summary: [facility]  [facility]   Imported By: Lanelle Bal 09/20/2010 12:30:59  _____________________________________________________________________  External Attachment:    Type:   Image     Comment:   External Document

## 2010-10-18 NOTE — Letter (Signed)
Summary: Meds/[facility]  Meds/[facility]   Imported By: Lanelle Bal 09/18/2010 11:54:33  _____________________________________________________________________  External Attachment:    Type:   Image     Comment:   External Document

## 2010-10-18 NOTE — Progress Notes (Signed)
Summary: Sputum cultures from [facility]  Phone Note Outgoing Call   Summary of Call: Call pt: I got the blood and respiratory cultures back from the hospital. BLood was negative.  SPutum was +. Will send over Bactrim antibiotic to your pharm. Complete all. Keep f/u appt.  Initial call taken by: Nani Gasser MD,  September 19, 2010 8:54 AM  Follow-up for Phone Call        called and spoke with daughter and notified her of results  Follow-up by: Avon Gully CMA, Duncan Dull),  September 19, 2010 1:49 PM    New/Updated Medications: BACTRIM DS 800-160 MG TABS (SULFAMETHOXAZOLE-TRIMETHOPRIM) Take 1 tablet by mouth two times a day for 10 days  times a day for 10 days Prescriptions: BACTRIM DS 800-160 MG TABS (SULFAMETHOXAZOLE-TRIMETHOPRIM) Take 1 tablet by mouth two times a day for 10 days  #20 x 0   Entered and Authorized by:   Nani Gasser MD   Signed by:   Nani Gasser MD on 09/19/2010   Method used:   Electronically to        [facility]* (retail)       [address] [place], [place]  [address]       Ph: [phone]       Fax: [phone]   RxID:   [id number]

## 2010-10-18 NOTE — Assessment & Plan Note (Signed)
Summary: Hosp f/u for bronchitis and COPD exacerbation   Vital Signs:  Patient profile:   77 year old female Height:      63 inches Weight:      146 pounds O2 Sat:      93 % on Room air Temp:     98.4 degrees F oral Pulse rate:   88 / minute BP sitting:   129 / 54  (right arm) Cuff size:   regular  Vitals Entered By: Avon Gully CMA, (AAMA) (September 14, 2010 2:00 PM)  O2 Flow:  Room air  Serial Vital Signs/Assessments:                                PEF    PreRx  PostRx Time      O2 Sat  O2 Type     L/min  L/min  L/min   By 2:50 PM   95  %   Room air                          Avon Gully CMA, (AAMA)  CC: Hosp f/u CBG Result 492   Primary Care Provider:  Nani Gasser MD  CC:  Hosp f/u.  History of Present Illness: Hospoitalized for bronchitis and COPD exacerbation. Still weak and SOB.  Still falling. Did get her walker.  In the hospital fo r4 days. D/C home on the 14th. Given Prednisone, cough syrup and 3 liters of cough syrup.  Finished steroid a week ago. Felt better last week .  Sugars running high.  She is using her Advair two times a day and she is using her albuterol about three times a day.  She has lost 15 lbs.  she also notes that her blood sugar was 500 yesterday.  This was extremely unusual.  And today was 200.  Needs a rx for pulls up for incontinence.   Has right sided right rib pain from her fall.  she fell about a week ago.Says would like something for pain.  Says she has been falling  because she is losing her balance. she says she fell again yesterday but did not hurt herself.  When I asked her why she was falling she was unable to tell me.  Current Medications (verified): 1)  Advair Diskus 250-50 Mcg/dose Misc (Fluticasone-Salmeterol) .... One Inhalation Twice Daily 2)  Proair Hfa 108 (90 Base) Mcg/act Aers (Albuterol Sulfate) .... 2 -4 Puffs Inhaled Every 4 Hours As Needed Sob. 3)  Aspirin 81 Mg Tbec (Aspirin) .... Take 1 Tablet By Mouth  Once A Day 4)  Atrovent Hfa 17 Mcg/act Aers (Ipratropium Bromide Hfa) .... 2 Puffs 4x's Daily 5)  Caltrate 600+d  Tabs (Calcium Carbonate-Vitamin D Tabs) .... Take 1 Tablet By Mouth Once A Day 6)  Glucotrol Xl 10 Mg Xr24h-Tab (Glipizide) .... Take 1 Tablet By Mouth Once A Day 7)  Lisinopril 10 Mg Tabs (Lisinopril) .... Take 1/2 Tablet By Mouth Once A Day 8)  Loratadine 10 Mg Tabs (Loratadine) .... Take 1 Tablet By Mouth Once A Day 9)  Multivitamins  Tabs (Multiple Vitamin) .... Take 1 Tablet By Mouth Once A Day 10)  Nitroglycerin 0.4 Mg Subl (Nitroglycerin) .... As Needed Chest Pain 11)  Sertraline Hcl 100 Mg Tabs (Sertraline Hcl) .... Take 1 Tablet By Mouth Once A Day 12)  Synthroid 50 Mcg Tabs (Levothyroxine Sodium) .... Take 1 Tablet  By Mouth Once A Day 13)  Triamcinolone Acetonide 0.1 % Crea (Triamcinolone Acetonide) .... Apply To Heals As Needed 14)  Tylenol Arthritis Pain 650 Mg Tbcr (Acetaminophen) .... As Needed 15)  Zocor 40 Mg Tabs (Simvastatin) .... One By Mouth At Bedtime 16)  True Track Glucometer and Test Strips .... Dx 250.00 Test 2 X A Day 17)  Albuterol Sulfate (2.5 Mg/63ml) 0.083% Nebu (Albuterol Sulfate) .... Neb Inhaled Every 4 Hours As Need For Sob 18)  Ferrous Sulfate 324 Mg  Tbec (Ferrous Sulfate) .[name].. 1 Tab By Mouth Daily 19)  Darvocet-N 100 100-650 Mg  Tabs (Propoxyphene N-Apap) .[name].. 1 Tab By Mouth Three Times A Day As Needed Back Pain 20)  Shower Chair .... Dx. Lower Extremity Weakness. 21)  Epipen 2-Pak 0.3 Mg/0.65ml (1:1000) Devi (Epinephrine Hcl (Anaphylaxis)) .... Use As Directed  Allergies (verified): 1)  ! Tetracycline  Comments:  Nurse/Medical Assistant: The patient's medications and allergies were reviewed with the patient and were updated in the Medication and Allergy Lists. Avon Gully CMA, Duncan Dull) (September 14, 2010 2:01 PM)  Past History:  Past Medical History: Last updated: 07/25/2010 Degenerative Disc Disease HOSPitalized in 1/07-[facility] Hx  rectal polyps(non cancerous) and mixed hemorrhoids Left eye retinopathy (disc hemorrhage) Wears bilat hearing aids Glaucoma.  MMSE 7-09 27/30 (passing is 27/30) Echo 07/2010 diastolic dysfunction, EF 55%.  performed at [facility]   Past Surgical History: Last updated: 12/04/2006 Echo Squamous cell removed -back Stress Test-EF 64%, some abnml`s Cataract removal.   Colonoscopy 04/2006-Dr. Leone Payor  Family History: Reviewed history from 08/29/2006 and no changes required. Depression-daughter  DM-mother, daughter, Hi chol  HTN-Father  Social History: Reviewed history from 09/28/2007 and no changes required. Retired Lawyer.  Lives with dtr and son-in-law.  Moved from [facility] 4/07.  Quit smoking 1989, no EtOH, no drugs, 3 caffeinated/day, no reg exercise.  Physical Exam  General:  Well-developed,well-nourished,in no acute distress; alert,appropriate and cooperative throughout examination Head:  Normocephalic and atraumatic without obvious abnormalities. No apparent alopecia or balding. Eyes:  No corneal or conjunctival inflammation noted. EOMI. Perrla. Ears:  External ear exam shows no significant lesions or deformities.  Otoscopic examination reveals clear canals, tympanic membranes are intact bilaterally without bulging, retraction, inflammation or discharge. Hearing is grossly normal bilaterally. Nose:  External nasal examination shows no deformity or inflammation.  Mouth:  Oral mucosa and oropharynx without lesions or exudates.  Teeth in good repair. Neck:  No deformities, masses, or tenderness noted. Lungs:  Normal respiratory effort, chest expands symmetrically. Lungs are clear to auscultation, no crackles or wheezes. Heart:  Normal rate and regular rhythm. S1 and S2 normal without gallop, murmur, click, rub or other extra sounds. Skin:  She has a large bruise and an excoriation over the right lower ribs.   Psych:  Cognition and judgment appear intact. Alert and cooperative with normal  attention span and concentration. No apparent delusions, illusions, hallucinations   Impression & Recommendations:  Problem # 1:  COPD (ICD-496)  Will add spiriva to her regimen. We really need to stage her once she is better.  Hopefully as she improves we can wean her oxygen back down to 2 liters. I think we need to extend her steroids and will recheck CBC to see if any sign of infection still present since her sugars have been running high the last couple of days and she finished her steroids a week ago. I am also concerned about her recent weight loss. Her updated medication list for this problem includes:  Advair Diskus 250-50 Mcg/dose Misc (Fluticasone-salmeterol) ..... One inhalation twice daily    Proair Hfa 108 (90 Base) Mcg/act Aers (Albuterol sulfate) .[name]... 2 -4 puffs inhaled every 4 hours as needed sob.    Atrovent Hfa 17 Mcg/act Aers (Ipratropium bromide hfa) .[name]... 2 puffs 4x's daily    Albuterol Sulfate (2.5 Mg/68ml) 0.083% Nebu (Albuterol sulfate) ..... Neb inhaled every 4 hours as need for sob    Spiriva Handihaler 18 Mcg Caps (Tiotropium bromide monohydrate) ..... One capsule inhaled daily.  Orders: T-CBC w/Diff ([id number])  Problem # 2:  ACCIDENTAL FALLS, RECURRENT (ICD-E888.9) I will give her tramadol for as needed pain relief from her ribs. Avoid narcotics as I don't want to increase her fall risk Also she needs to use her walker. I would like to get PT in the home for balanc therapy. not exactly clear why she is falling.  Asked her if it was weakness in her legs versus dizziness versus vertigo or gait problems and she was unable to tell me she said she is not sure.  The son had been getting physical therapy and home health into the home and revealed to give me a little bit more information about what's going on and help her with some balance therapy.  Problem # 3:  DIABETES MELLITUS II, UNCOMPLICATED (ICD-250.00) Not sure why her sugar is suddenly out of control the last  2 days. She completed her steroids 1 wk ago.  We checked her mahcine against our and it is accurate. Given 15 units of Lantus in the office.  Make sure hydrated. Will check UA.  Given 15 units daily until sugars under 180 in the mornings.  her daughter was here and administered the Lantus injection.  She was scratched by the nurse. because her son sugar suddenly jumped and concerned about an infection.  We did do a urinalysis today whichhad positive leukocytes and some blood cell send this for culture.  Currently she does not have any urinary symptoms but certainly this could be causing elevation of her sugars. Her updated medication list for this problem includes:    Aspirin 81 Mg Tbec (Aspirin) .[name]... Take 1 tablet by mouth once a day    Glucotrol Xl 10 Mg Xr24h-tab (Glipizide) .[name]... Take 1 tablet by mouth once a day    Lisinopril 10 Mg Tabs (Lisinopril) .[name]... Take 1/2 tablet by mouth once a day  Orders: T-CBC w/Diff ([id number]) T-Comprehensive Metabolic Panel ([id number]) Glucose,(CBG) using home device (87564) T-Urine Culture (Spectrum Order) ([id number])  Complete Medication List: 1)  Advair Diskus 250-50 Mcg/dose Misc (Fluticasone-salmeterol) .... One inhalation twice daily 2)  Proair Hfa 108 (90 Base) Mcg/act Aers (Albuterol sulfate) .... 2 -4 puffs inhaled every 4 hours as needed sob. 3)  Aspirin 81 Mg Tbec (Aspirin) .... Take 1 tablet by mouth once a day 4)  Atrovent Hfa 17 Mcg/act Aers (Ipratropium bromide hfa) .... 2 puffs 4x's daily 5)  Caltrate 600+d Tabs (Calcium carbonate-vitamin d tabs) .... Take 1 tablet by mouth once a day 6)  Glucotrol Xl 10 Mg Xr24h-tab (Glipizide) .... Take 1 tablet by mouth once a day 7)  Lisinopril 10 Mg Tabs (Lisinopril) .... Take 1/2 tablet by mouth once a day 8)  Loratadine 10 Mg Tabs (Loratadine) .... Take 1 tablet by mouth once a day 9)  Multivitamins Tabs (Multiple vitamin) .... Take 1 tablet by mouth once a day 10)  Nitroglycerin 0.4 Mg Subl  (Nitroglycerin) .... As needed chest pain 11)  Sertraline Hcl 100  Mg Tabs (Sertraline hcl) .... Take 1 tablet by mouth once a day 12)  Synthroid 50 Mcg Tabs (Levothyroxine sodium) .... Take 1 tablet by mouth once a day 13)  Triamcinolone Acetonide 0.1 % Crea (Triamcinolone acetonide) .... Apply to heals as needed 14)  Tylenol Arthritis Pain 650 Mg Tbcr (Acetaminophen) .... As needed 15)  Zocor 40 Mg Tabs (Simvastatin) .... One by mouth at bedtime 16)  True Track Glucometer and Test Strips  .... Dx 250.00 test 2 x a day 17)  Albuterol Sulfate (2.5 Mg/10ml) 0.083% Nebu (Albuterol sulfate) .... Neb inhaled every 4 hours as need for sob 18)  Ferrous Sulfate 324 Mg Tbec (Ferrous sulfate) .[name].. 1 tab by mouth daily 19)  Shower Chair  .... Dx. lower extremity weakness. 20)  Epipen 2-pak 0.3 Mg/0.34ml (1:1000) Devi (Epinephrine hcl (anaphylaxis)) .... Use as directed 21)  Pull Ups  .... Dx urinary incontenence 22)  Tramadol Hcl 50 Mg Tabs (Tramadol hcl) .... Two times a day tab as needed for rib pain 23)  Spiriva Handihaler 18 Mcg Caps (Tiotropium bromide monohydrate) .... One capsule inhaled daily.  Other Orders: Home Health Referral [facility])  Patient Instructions: 1)  Please schedule a follow-up appointment in 2 weeks.  2)  I will call you with the lab results.  3)  Start spiriva 4)  I will restart the steroids if her white count on her blood test is normal.   5)  Start the Lantus 15 units daily until sugar under 180 fasting (in the morning before breakfast).  Then drop down to 10 units daily until morning sugar under 150 then drop down to 5 units.   6)  You can use the tramadol as needed for pain reliefl 7)  I am going to have home heatlh come out and set you up for balance therapy  Prescriptions: ALBUTEROL SULFATE (2.5 MG/3ML) 0.083% NEBU (ALBUTEROL SULFATE) NEB inhaled every 4 hours as need for SOB  #30 day suppl x 4   Entered by:   Avon Gully CMA, (AAMA)   Authorized by:    Nani Gasser MD   Signed by:   Avon Gully CMA, Duncan Dull) on 09/14/2010   Method used:   Electronically to        [facility]* (retail)       [address] [place], [place]  [address]       Ph: [phone]       Fax: [phone]   RxID:   [id number] SPIRIVA HANDIHALER 18 MCG CAPS (TIOTROPIUM BROMIDE MONOHYDRATE) one capsule inhaled daily.  #30 x 1   Entered and Authorized by:   Nani Gasser MD   Signed by:   Nani Gasser MD on 09/14/2010   Method used:   Electronically to        [facility]* (retail)       [address] [place], [place]  [address]       Ph: [phone]       Fax: [phone]   RxID:   [id number] TRAMADOL HCL 50 MG TABS (TRAMADOL HCL) two times a day tab as needed for rib pain  #45 x 0   Entered and Authorized by:   Nani Gasser MD   Signed by:   Nani Gasser MD on 09/14/2010   Method used:   Print then Give to Patient   RxID:   [id number] PULL UPS Dx urinary incontenence  #  90 days supp x PRN   Entered and Authorized by:   Nani Gasser MD   Signed by:   Nani Gasser MD on 09/14/2010   Method used:   Print then Give to Patient   RxID:   [id number]    Orders Added: 1)  Home Health Referral [facility]] 2)  T-CBC w/Diff [[id number]] 3)  T-Comprehensive Metabolic Panel [[id number]] 4)  Glucose,(CBG) using home device [82962] 5)  T-Urine Culture (Spectrum Order) [[id number]] 6)  Est. Patient Level IV [41324]    Laboratory Results   Urine Tests  Date/Time Received: 09/14/10 Date/Time Reported: 09/14/10  Routine Urinalysis   Color: yellow Appearance: Clear Glucose: negative   (Normal Range: Negative) Bilirubin: negative   (Normal Range: Negative) Ketone: trace (5)   (Normal Range: Negative) Spec. Gravity: 1.015   (Normal Range: 1.003-1.035) Blood: trace-intact   (Normal Range: Negative) pH: 6.5   (Normal Range: 5.0-8.0) Protein: negative   (Normal Range:  Negative) Urobilinogen: 0.2   (Normal Range: 0-1) Nitrite: negative   (Normal Range: Negative) Leukocyte Esterace: small   (Normal Range: Negative)     Blood Tests   Date/Time Received: 09/14/10 Date/Time Reported: 09/14/10  CBG Random:: 492mg /dL       Appended Document: Hosp f/u for bronchitis and COPD exacerbation Please omit satement about scratched by the nurse. This was incorrect dictation.

## 2010-10-18 NOTE — Consult Note (Signed)
Summary: [facility]  [facility]   Imported By: Lanelle Bal 09/20/2010 12:26:41  _____________________________________________________________________  External Attachment:    Type:   Image     Comment:   External Document

## 2010-10-18 NOTE — Progress Notes (Signed)
  Prescriptions: NEEDLES FOR THE LANTUS PEN Dx : 250.00 Uses once a day  #30 x 11   Entered by:   Payton Spark CMA   Authorized by:   Nani Gasser MD   Signed by:   Payton Spark CMA on 10/08/2010   Method used:   Printed then faxed to ...       [facility]* (retail)       [address]       [place], [place]  [address]       Ph: [phone]       Fax: [phone]   RxID:   [id number] LANTUS SOLOSTAR 100 UNIT/ML SOLN (INSULIN GLARGINE) inject 5-10 units daily.  #30day sply x 2   Entered by:   Payton Spark CMA   Authorized by:   Nani Gasser MD   Signed by:   Payton Spark CMA on 10/08/2010   Method used:   Electronically to        [facility]* (retail)       [address]       [place], [place]  [address]       Ph: [phone]       Fax: [phone]   RxID:   [id number]

## 2010-10-18 NOTE — Assessment & Plan Note (Signed)
Summary: F/U COPD, falls   Vital Signs:  Patient profile:   77 year old female Height:      63 inches Weight:      156 pounds O2 Sat:      84 % on Room air Temp:     97.5 degrees F oral Pulse rate:   98 / minute BP sitting:   99 / 48  (right arm) Cuff size:   regular  Vitals Entered By: Avon Gully CMA, Duncan Dull) (September 21, 2010 2:37 PM)  O2 Flow:  Room air CC: f/u SOB   Primary Care Provider:  Nani Gasser MD  CC:  f/u SOB.  History of Present Illness: overall she feels about the same.  She has not had any fever.  She still has a slight cough and felt a little short of breath.  He did get a chest x-ray last week which was negative.  We discussed this she still felt symptomatically would consider restarting her steroids.  She is wearing her oxygen at home but does not have any portable options and thus she does not have it on here.  We had tried to contact the home health company but evidently they have not gotten in touch with her.  She feel again yesterday after feeling dizzy. She doesn't feel dizzy today. Notes her BP has been running low and she is not sure why. Says she has been eating and staying hydrated.    Current Medications (verified): 1)  Advair Diskus 250-50 Mcg/dose Misc (Fluticasone-Salmeterol) .... One Inhalation Twice Daily 2)  Proair Hfa 108 (90 Base) Mcg/act Aers (Albuterol Sulfate) .... 2 -4 Puffs Inhaled Every 4 Hours As Needed Sob. 3)  Aspirin 81 Mg Tbec (Aspirin) .... Take 1 Tablet By Mouth Once A Day 4)  Atrovent Hfa 17 Mcg/act Aers (Ipratropium Bromide Hfa) .... 2 Puffs 4x's Daily 5)  Caltrate 600+d  Tabs (Calcium Carbonate-Vitamin D Tabs) .... Take 1 Tablet By Mouth Once A Day 6)  Glucotrol Xl 10 Mg Xr24h-Tab (Glipizide) .... Take 1 Tablet By Mouth Once A Day 7)  Lisinopril 10 Mg Tabs (Lisinopril) .... Take 1/2 Tablet By Mouth Once A Day 8)  Loratadine 10 Mg Tabs (Loratadine) .... Take 1 Tablet By Mouth Once A Day 9)  Multivitamins  Tabs  (Multiple Vitamin) .... Take 1 Tablet By Mouth Once A Day 10)  Nitroglycerin 0.4 Mg Subl (Nitroglycerin) .... As Needed Chest Pain 11)  Sertraline Hcl 100 Mg Tabs (Sertraline Hcl) .... Take 1 Tablet By Mouth Once A Day 12)  Synthroid 50 Mcg Tabs (Levothyroxine Sodium) .... Take 1 Tablet By Mouth Once A Day 13)  Triamcinolone Acetonide 0.1 % Crea (Triamcinolone Acetonide) .... Apply To Heals As Needed 14)  Tylenol Arthritis Pain 650 Mg Tbcr (Acetaminophen) .... As Needed 15)  Zocor 40 Mg Tabs (Simvastatin) .... One By Mouth At Bedtime 16)  True Track Glucometer and Test Strips .... Dx 250.00 Test 2 X A Day 17)  Albuterol Sulfate (2.5 Mg/7ml) 0.083% Nebu (Albuterol Sulfate) .... Neb Inhaled Every 4 Hours As Need For Sob 18)  Ferrous Sulfate 324 Mg  Tbec (Ferrous Sulfate) .[name].. 1 Tab By Mouth Daily 19)  Shower Chair .... Dx. Lower Extremity Weakness. 20)  Epipen 2-Pak 0.3 Mg/0.46ml (1:1000) Devi (Epinephrine Hcl (Anaphylaxis)) .... Use As Directed 21)  Pull Ups .... Dx Urinary Incontenence 22)  Tramadol Hcl 50 Mg Tabs (Tramadol Hcl) .... Two Times A Day Tab As Needed For Rib Pain 23)  Spiriva Handihaler  18 Mcg Caps (Tiotropium Bromide Monohydrate) .... One Capsule Inhaled Daily. 24)  Prednisone 20 Mg Tabs (Prednisone) .... 2 Tabs Daily For 7 Days 25)  Bactrim Ds 800-160 Mg Tabs (Sulfamethoxazole-Trimethoprim) .... Take 1 Tablet By Mouth Two Times A Day For 10 Days  Allergies (verified): 1)  ! Tetracycline  Comments:  Nurse/Medical Assistant: The patient's medications and allergies were reviewed with the patient and were updated in the Medication and Allergy Lists. Avon Gully CMA, Duncan Dull) (September 21, 2010 2:38 PM)  Physical Exam  General:  Well-developed,well-nourished,in no acute distress; alert,appropriate and cooperative throughout examination Head:  Normocephalic and atraumatic without obvious abnormalities. No apparent alopecia or balding. Eyes:  No corneal or conjunctival  inflammation noted. EOMI. Perrla. Ears:  External ear exam shows no significant lesions or deformities.  Otoscopic examination reveals clear canals, tympanic membranes are intact bilaterally without bulging, retraction, inflammation or discharge. Hearing is grossly normal bilaterally. Nose:  External nasal examination shows no deformity or inflammation. Mouth:  Oral mucosa and oropharynx without lesions or exudates.  Lungs:  Normal respiratory effort, chest expands symmetrically. Lungs are clear to auscultation, no crackles or wheezes. Heart:  Normal rate and regular rhythm. S1 and S2 normal without gallop, murmur, click, rub or other extra sounds. Skin:  no rashes.  Brusingon both wrist. No sign of fracture.   Cervical Nodes:  No lymphadenopathy noted Psych:  Cognition and judgment appear intact. Alert and cooperative with normal attention span and concentration. No apparent delusions, illusions, hallucinations   Impression & Recommendations:  Problem # 1:  COPD (ICD-496) Will restart her steroids. Follow up in 2 wweeks. Will work on Home health to get her portable oxygen.  she was also having difficulty with his breathing and brought in with her today.  I helped her and demonstrated to her how to correctly use the product.  She did inhalation correctly here in the office. Her updated medication list for this problem includes:    Advair Diskus 250-50 Mcg/dose Misc (Fluticasone-salmeterol) ..... One inhalation twice daily    Proair Hfa 108 (90 Base) Mcg/act Aers (Albuterol sulfate) .[name]... 2 -4 puffs inhaled every 4 hours as needed sob.    Atrovent Hfa 17 Mcg/act Aers (Ipratropium bromide hfa) .[name]... 2 puffs 4x's daily    Albuterol Sulfate (2.5 Mg/10ml) 0.083% Nebu (Albuterol sulfate) ..... Neb inhaled every 4 hours as need for sob    Spiriva Handihaler 18 Mcg Caps (Tiotropium bromide monohydrate) ..... One capsule inhaled daily.  Problem # 2:  HYPOTENSION (ICD-458.9)  I think this may be why  she is falling. Stop the lisinopril fo rnow and will refer to Cardiology. also will arrange for home health to come into her home to see if we can do anything to reduce her risk for falls and maybe even get her involved in a balance therapy.  Orders: Cardiology Referral (Cardiology)  Complete Medication List: 1)  Advair Diskus 250-50 Mcg/dose Misc (Fluticasone-salmeterol) .... One inhalation twice daily 2)  Proair Hfa 108 (90 Base) Mcg/act Aers (Albuterol sulfate) .... 2 -4 puffs inhaled every 4 hours as needed sob. 3)  Aspirin 81 Mg Tbec (Aspirin) .... Take 1 tablet by mouth once a day 4)  Atrovent Hfa 17 Mcg/act Aers (Ipratropium bromide hfa) .... 2 puffs 4x's daily 5)  Caltrate 600+d Tabs (Calcium carbonate-vitamin d tabs) .... Take 1 tablet by mouth once a day 6)  Glucotrol Xl 10 Mg Xr24h-tab (Glipizide) .... Take 1 tablet by mouth once a day 7)  Lisinopril  10 Mg Tabs (Lisinopril) .... Take 1/2 tablet by mouth once a day 8)  Loratadine 10 Mg Tabs (Loratadine) .... Take 1 tablet by mouth once a day 9)  Multivitamins Tabs (Multiple vitamin) .... Take 1 tablet by mouth once a day 10)  Nitroglycerin 0.4 Mg Subl (Nitroglycerin) .... As needed chest pain 11)  Sertraline Hcl 100 Mg Tabs (Sertraline hcl) .... Take 1 tablet by mouth once a day 12)  Synthroid 50 Mcg Tabs (Levothyroxine sodium) .... Take 1 tablet by mouth once a day 13)  Triamcinolone Acetonide 0.1 % Crea (Triamcinolone acetonide) .... Apply to heals as needed 14)  Tylenol Arthritis Pain 650 Mg Tbcr (Acetaminophen) .... As needed 15)  Zocor 40 Mg Tabs (Simvastatin) .... One by mouth at bedtime 16)  True Track Glucometer and Test Strips  .... Dx 250.00 test 2 x a day 17)  Albuterol Sulfate (2.5 Mg/46ml) 0.083% Nebu (Albuterol sulfate) .... Neb inhaled every 4 hours as need for sob 18)  Ferrous Sulfate 324 Mg Tbec (Ferrous sulfate) .[name].. 1 tab by mouth daily 19)  Shower Chair  .... Dx. lower extremity weakness. 20)  Epipen 2-pak 0.3  Mg/0.73ml (1:1000) Devi (Epinephrine hcl (anaphylaxis)) .... Use as directed 21)  Pull Ups  .... Dx urinary incontenence 22)  Tramadol Hcl 50 Mg Tabs (Tramadol hcl) .... Two times a day tab as needed for rib pain 23)  Spiriva Handihaler 18 Mcg Caps (Tiotropium bromide monohydrate) .... One capsule inhaled daily. 24)  Prednisone 20 Mg Tabs (Prednisone) .... 2 tabs by mouth daily for one week, then dec to one tab dialy for one week.  Patient Instructions: 1)  HOLD the lisinopril 2)  Spiriva once a day.  3)  We will cal you with the cardiology appt. 4)  Let us know if you have not heard from home health in one week.  5)  Please schedule a follow-up appointment in 2 weeks.  Prescriptions: PREDNISONE 20 MG TABS (PREDNISONE) 2 tabs by mouth daily for one week, then dec to one tab dialy for one week.  #21 x 0   Entered and Authorized by:   Nani Gasser MD   Signed by:   Nani Gasser MD on 09/21/2010   Method used:   Electronically to        [facility]* (retail)       [address] [place], [place]  [address]       Ph: [phone]       Fax: [phone]   RxID:   [id number]    Orders Added: 1)  Est. Patient Level IV [99371] 2)  Cardiology Referral [Cardiology]

## 2010-10-18 NOTE — Assessment & Plan Note (Signed)
Summary: F/u DM, hypotension, rib pain   Vital Signs:  Patient profile:   77 year old female Height:      63 inches Weight:      152 pounds Pulse rate:   76 / minute BP sitting:   109 / 54  (right arm) Cuff size:   regular  Vitals Entered By: Avon Gully CMA, Duncan Dull) (October 02, 2010 1:38 PM) CC: f/u hypotension, elev glucose   Primary Care Provider:  Nani Gasser MD  CC:  f/u hypotension and elev glucose.  History of Present Illness: f/u hypotension, elev glucose. Her BP is better. Has post poned the PT for balance therapy for now as they had to move into an apartment. her daughter with whom she lives recently separated from her husband.  She did bring in her glucose level today.  She's been giving herself her Lantus in where between 5 to 15 units depending on her sugar.  Her daughter has been holding her Lantus dose if her sugar is under 100.  She has not had any more falls or hypotensive episodes.  She did say she is still having a lot of soreness around her left lower ribs near the breast area.  There is definitely a bruise there now.she injured this area after a fall.  She did stop her  lisinopril  Current Medications (verified): 1)  Advair Diskus 250-50 Mcg/dose Misc (Fluticasone-Salmeterol) .... One Inhalation Twice Daily 2)  Proair Hfa 108 (90 Base) Mcg/act Aers (Albuterol Sulfate) .... 2 -4 Puffs Inhaled Every 4 Hours As Needed Sob. 3)  Aspirin 81 Mg Tbec (Aspirin) .... Take 1 Tablet By Mouth Once A Day 4)  Atrovent Hfa 17 Mcg/act Aers (Ipratropium Bromide Hfa) .... 2 Puffs 4x's Daily 5)  Caltrate 600+d  Tabs (Calcium Carbonate-Vitamin D Tabs) .... Take 1 Tablet By Mouth Once A Day 6)  Glucotrol Xl 10 Mg Xr24h-Tab (Glipizide) .... Take 1 Tablet By Mouth Once A Day 7)  Lisinopril 10 Mg Tabs (Lisinopril) .... Take 1/2 Tablet By Mouth Once A Day 8)  Loratadine 10 Mg Tabs (Loratadine) .... Take 1 Tablet By Mouth Once A Day 9)  Multivitamins  Tabs (Multiple Vitamin)  .... Take 1 Tablet By Mouth Once A Day 10)  Nitroglycerin 0.4 Mg Subl (Nitroglycerin) .... As Needed Chest Pain 11)  Sertraline Hcl 100 Mg Tabs (Sertraline Hcl) .... Take 1 Tablet By Mouth Once A Day 12)  Synthroid 50 Mcg Tabs (Levothyroxine Sodium) .... Take 1 Tablet By Mouth Once A Day 13)  Triamcinolone Acetonide 0.1 % Crea (Triamcinolone Acetonide) .... Apply To Heals As Needed 14)  Tylenol Arthritis Pain 650 Mg Tbcr (Acetaminophen) .... As Needed 15)  Zocor 40 Mg Tabs (Simvastatin) .... One By Mouth At Bedtime 16)  True Track Glucometer and Test Strips .... Dx 250.00 Test 2 X A Day 17)  Albuterol Sulfate (2.5 Mg/40ml) 0.083% Nebu (Albuterol Sulfate) .... Neb Inhaled Every 4 Hours As Need For Sob 18)  Ferrous Sulfate 324 Mg  Tbec (Ferrous Sulfate) .[name].. 1 Tab By Mouth Daily 19)  Shower Chair .... Dx. Lower Extremity Weakness. 20)  Epipen 2-Pak 0.3 Mg/0.28ml (1:1000) Devi (Epinephrine Hcl (Anaphylaxis)) .... Use As Directed 21)  Pull Ups .... Dx Urinary Incontenence 22)  Tramadol Hcl 50 Mg Tabs (Tramadol Hcl) .... Two Times A Day Tab As Needed For Rib Pain 23)  Spiriva Handihaler 18 Mcg Caps (Tiotropium Bromide Monohydrate) .... One Capsule Inhaled Daily. 24)  Prednisone 20 Mg Tabs (Prednisone) .... 2 Tabs  By Mouth Daily For One Week, Then Dec To One Tab Dialy For One Week.  Allergies (verified): 1)  ! Tetracycline  Comments:  Nurse/Medical Assistant: The patient's medications and allergies were reviewed with the patient and were updated in the Medication and Allergy Lists. Avon Gully CMA, Duncan Dull) (October 02, 2010 1:39 PM) Avon Gully CMA, Duncan Dull) (October 02, 2010 1:39 PM)  Physical Exam  General:  Well-developed,well-nourished,in no acute distress; alert,appropriate and cooperative throughout examination Head:  Normocephalic and atraumatic without obvious abnormalities. No apparent alopecia or balding. Lungs:  Normal respiratory effort, chest expands symmetrically. Lungs  are clear to auscultation, no crackles or wheezes. Heart:  Normal rate and regular rhythm. S1 and S2 normal without gallop, murmur, click, rub or other extra sounds. Pulses:  radial pulses 2+ bilaterally Extremities:  no lower extremity edema Skin:  no rashes.   Cervical Nodes:  No lymphadenopathy noted Psych:  Cognition and judgment appear intact. Alert and cooperative with normal attention span and concentration. No apparent delusions, illusions, hallucinations   Impression & Recommendations:  Problem # 1:  HYPOTENSION (ICD-458.9) BP is better today. Has f/u with [facility] in FEb. Has appt already for further w/u.  Problem # 2:  DIABETES MELLITUS II, UNCOMPLICATED (ICD-250.00) her sugars look well controlled in her book.  Continue with the Lantus for now.  I did give her a sample pain to use.  I will need to send in a prescription for needles.follow-up in two months. The following medications were removed from the medication list:    Lisinopril 10 Mg Tabs (Lisinopril) .[name]... Take 1/2 tablet by mouth once a day Her updated medication list for this problem includes:    Aspirin 81 Mg Tbec (Aspirin) .[name]... Take 1 tablet by mouth once a day    Glucotrol Xl 10 Mg Xr24h-tab (Glipizide) .[name]... Take 1 tablet by mouth once a day    Lantus Solostar 100 Unit/ml Soln (Insulin glargine) ..... Inject 5-10 units daily.  Problem # 3:  RIB PAIN (ICD-786.50) she is still having a fair amount of rib pain.  She's been taking her daughter's hydrocodone.  I did discuss with her that we need to use this very cautiously as it does increase her risk of falling again.  I did give her a very small quantity to take at a very low dose at bedtime.  During the day I would prefer that she used the tramadol.  She says it does help some but not when her pain is more severe.  I explained her diet over the next two to 3 weeks her pain should continue to improve and likely should not need the hydrocodone at bedtime  anymore.  Complete Medication List: 1)  Advair Diskus 250-50 Mcg/dose Misc (Fluticasone-salmeterol) .... One inhalation twice daily 2)  Proair Hfa 108 (90 Base) Mcg/act Aers (Albuterol sulfate) .... 2 -4 puffs inhaled every 4 hours as needed sob. 3)  Aspirin 81 Mg Tbec (Aspirin) .... Take 1 tablet by mouth once a day 4)  Atrovent Hfa 17 Mcg/act Aers (Ipratropium bromide hfa) .... 2 puffs 4x's daily 5)  Caltrate 600+d Tabs (Calcium carbonate-vitamin d tabs) .... Take 1 tablet by mouth once a day 6)  Glucotrol Xl 10 Mg Xr24h-tab (Glipizide) .... Take 1 tablet by mouth once a day 7)  Loratadine 10 Mg Tabs (Loratadine) .... Take 1 tablet by mouth once a day 8)  Multivitamins Tabs (Multiple vitamin) .... Take 1 tablet by mouth once a day 9)  Nitroglycerin 0.4 Mg Subl (  Nitroglycerin) .... As needed chest pain 10)  Sertraline Hcl 100 Mg Tabs (Sertraline hcl) .... Take 1 tablet by mouth once a day 11)  Synthroid 50 Mcg Tabs (Levothyroxine sodium) .... Take 1 tablet by mouth once a day 12)  Triamcinolone Acetonide 0.1 % Crea (Triamcinolone acetonide) .... Apply to heals as needed 13)  Tylenol Arthritis Pain 650 Mg Tbcr (Acetaminophen) .... As needed 14)  Zocor 40 Mg Tabs (Simvastatin) .... One by mouth at bedtime 15)  True Track Glucometer and Test Strips  .... Dx 250.00 test 2 x a day 16)  Albuterol Sulfate (2.5 Mg/69ml) 0.083% Nebu (Albuterol sulfate) .... Neb inhaled every 4 hours as need for sob 17)  Ferrous Sulfate 324 Mg Tbec (Ferrous sulfate) .[name].. 1 tab by mouth daily 18)  Tramadol Hcl 50 Mg Tabs (Tramadol hcl) .... Two times a day tab as needed for rib pain 19)  Spiriva Handihaler 18 Mcg Caps (Tiotropium bromide monohydrate) .... One capsule inhaled daily. 20)  Lantus Solostar 100 Unit/ml Soln (Insulin glargine) .... Inject 5-10 units daily. 21)  Hydrocodone-acetaminophen 2.5-500 Mg Tabs (Hydrocodone-acetaminophen) .[name].. 1-2 tab at bedtime as needed severe pain 22)  Needles For The Lantus Pen   .... Dx : 250.00 uses once a day  Patient Instructions: 1)  Please schedule a follow-up appointment in 2 months.  Prescriptions: NEEDLES FOR THE LANTUS PEN Dx : 250.00 Uses once a day  #30 x 11   Entered and Authorized by:   Nani Gasser MD   Signed by:   Nani Gasser MD on 10/02/2010   Method used:   Printed then faxed to ...       [facility]* (retail)       [address] [place], [place]  [address]       Ph: [phone]       Fax: [phone]   RxID:   [id number] HYDROCODONE-ACETAMINOPHEN 2.5-500 MG TABS (HYDROCODONE-ACETAMINOPHEN) 1-2 tab at bedtime as needed severe pain  #30 x 0   Entered and Authorized by:   Nani Gasser MD   Signed by:   Nani Gasser MD on 10/02/2010   Method used:   Printed then faxed to ...       [facility]* (retail)       [address] [place], [place]  [address]       Ph: [phone]       Fax: [phone]   RxID:   [id number]    Orders Added: 1)  Est. Patient Level III [01093]

## 2010-10-19 ENCOUNTER — Telehealth: Payer: Self-pay | Admitting: Family Medicine

## 2010-10-23 ENCOUNTER — Telehealth: Payer: Self-pay | Admitting: Family Medicine

## 2010-10-24 NOTE — Progress Notes (Signed)
Summary: [facility]-Cough/insulin instructions  Phone Note From Other Clinic Call back at [phone] daughter   Caller: Drinda Butts, RN home [phone] Summary of Call: Home health nurse was in for eval with pt today.  Per Drinda Butts, pt had some wheezing in front, otherwise lungs sounded good, a wet cough and thick secretions although pt has not been able to cough anything up.  Sats were 93% on room air.  Pt has continued all meds as prescribed.  Drinda Butts was originally looking to have abx called in as pt was just seen.  Advised Drinda Butts pt was seen for diabetes issues not COPD issues.  I spoke to pt's daughter who states that Kipp Brood, the physical therapist, has also seen her mother today and he does not hear any wheezing in pt's lungs.  Pt's daughter would like cough syrup called in to pharmacy.  Daughter does not want eval as she does not see anythign wrong with her mother except the cough. Pt's duaghter also has a question regarding pt's sliding scale insulin. Pt was told to use novolog sample as rescue and for sliding scale although no instructions were given. Of note, per Drinda Butts, she has ordered a social work visit to eval home situation.  Pt is living in apt with daughter and 5 dogs, strong urine smell in home and daughter smokes around pt. Please advise if pt needs abx/cough syrup and instructions for sliding scale insulin. Initial call taken by: Francee Piccolo CMA Duncan Dull),  October 19, 2010 3:25 PM  Follow-up for Phone Call        advise use of Robiutssin DM OTC for cough. Schedule f/u with Dr Judie Petit for next wk if not improving. Follow-up by: Seymour Bars DO,  October 19, 2010 3:27 PM     Appended Document: [facility]-Cough/insulin instructions notified pt of above.  She is agreeable. also gave SSI instructions.  Jenel Lucks was able to repeat perameters to me.

## 2010-10-24 NOTE — Assessment & Plan Note (Signed)
Summary: F/U ER visit for Diabetes   Vital Signs:  Patient profile:   77 year old female Height:      63 inches Weight:      149 pounds Pulse rate:   88 / minute BP sitting:   121 / 58  (right arm) Cuff size:   regular  Vitals Entered By: Avon Gully CMA, Duncan Dull) (October 16, 2010 2:22 PM) CC: f/u ER, elevated blood sugar   Primary Care Provider:  Nani Gasser MD  CC:  f/u ER and elevated blood sugar.  History of Present Illness: On Sat woke up clammy and shake and felt drunk and dizzy all day. Checked sugar and it was in the 300--500s.  GAve her total of 15 units of Lantus that day. Wasn't dehydrated. thinks may have had a UTI, but was not d/c home on any ABX. Went to ED.  She has completed her steroid. Denies eating any thing out of the ordinary. Next days sugas were back under control.   Now getting PT in the home twice a week.   Has lost lost 4 lbs. BUt has been trying to eat better and trying to lose weight.   you and review the emergency department note she did have greater than thousand glucose in her urine.  She also had some hyalin casts in the urine her sodium was low at 131 glucose was 443 her alk phos was 122.  The creatinine was 2.0.  Her hemoglobin was a little bit low at 11.3.  Her sugars came down and she was discharged home  . there was not asuspect diagnosis for why this may have happened.  Diabetes Management History:      The patient is a 77 years old female who comes in for evaluation of Type 2 Diabetes Mellitus.  She has not been enrolled in the "Diabetic Education Program".  She is checking home blood sugars.  She says that she is exercising.  Type of exercise includes: walking.    Current Medications (verified): 1)  Advair Diskus 250-50 Mcg/dose Misc (Fluticasone-Salmeterol) .... One Inhalation Twice Daily 2)  Proair Hfa 108 (90 Base) Mcg/act Aers (Albuterol Sulfate) .... 2 -4 Puffs Inhaled Every 4 Hours As Needed Sob. 3)  Aspirin 81 Mg Tbec  (Aspirin) .... Take 1 Tablet By Mouth Once A Day 4)  Atrovent Hfa 17 Mcg/act Aers (Ipratropium Bromide Hfa) .... 2 Puffs 4x's Daily 5)  Caltrate 600+d  Tabs (Calcium Carbonate-Vitamin D Tabs) .... Take 1 Tablet By Mouth Once A Day 6)  Glucotrol Xl 10 Mg Xr24h-Tab (Glipizide) .... Take 1 Tablet By Mouth Once A Day 7)  Loratadine 10 Mg Tabs (Loratadine) .... Take 1 Tablet By Mouth Once A Day 8)  Multivitamins  Tabs (Multiple Vitamin) .... Take 1 Tablet By Mouth Once A Day 9)  Nitroglycerin 0.4 Mg Subl (Nitroglycerin) .... As Needed Chest Pain 10)  Sertraline Hcl 100 Mg Tabs (Sertraline Hcl) .... Take 1 Tablet By Mouth Once A Day 11)  Synthroid 50 Mcg Tabs (Levothyroxine Sodium) .... Take 1 Tablet By Mouth Once A Day 12)  Triamcinolone Acetonide 0.1 % Crea (Triamcinolone Acetonide) .... Apply To Heals As Needed 13)  Tylenol Arthritis Pain 650 Mg Tbcr (Acetaminophen) .... As Needed 14)  Zocor 40 Mg Tabs (Simvastatin) .... One By Mouth At Bedtime 15)  True Track Glucometer and Test Strips .... Dx 250.00 Test 2 X A Day 16)  Albuterol Sulfate (2.5 Mg/84ml) 0.083% Nebu (Albuterol Sulfate) .... Neb Inhaled Every 4  Hours As Need For Sob 17)  Ferrous Sulfate 324 Mg  Tbec (Ferrous Sulfate) .[name].. 1 Tab By Mouth Daily 18)  Tramadol Hcl 50 Mg Tabs (Tramadol Hcl) .... Two Times A Day Tab As Needed For Rib Pain 19)  Spiriva Handihaler 18 Mcg Caps (Tiotropium Bromide Monohydrate) .... One Capsule Inhaled Daily. 20)  Lantus Solostar 100 Unit/ml Soln (Insulin Glargine) .... Inject 5-10 Units Daily. 21)  Hydrocodone-Acetaminophen 2.5-500 Mg Tabs (Hydrocodone-Acetaminophen) .[name].. 1-2 Tab At Bedtime As Needed Severe Pain 22)  Needles For The Lantus Pen .... Dx : 250.00 Uses Once A Day  Allergies (verified): 1)  ! Tetracycline  Comments:  Nurse/Medical Assistant: The patient's medications and allergies were reviewed with the patient and were updated in the Medication and Allergy Lists. Avon Gully CMA, Duncan Dull)  (October 16, 2010 2:23 PM)  Physical Exam  General:  Well-developed,well-nourished,in no acute distress; alert,appropriate and cooperative throughout examination Head:  Normocephalic and atraumatic without obvious abnormalities. No apparent alopecia or balding. Lungs:  Normal respiratory effort, chest expands symmetrically. Lungs are clear to auscultation, no crackles or wheezes. Heart:  Normal rate and regular rhythm. S1 and S2 normal without gallop, murmur, click, rub or other extra sounds. Pulses:  radial pulse 2+ bilaterally. Extremities:  No lower extremity. edema. Skin:  no rashes.   Psych:  Cognition and judgment appear intact. Alert and cooperative with normal attention span and concentration. No apparent delusions, illusions, hallucinations   Impression & Recommendations:  Problem # 1:  CHRONIC KIDNEY DISEASE STAGE III (MODERATE) (ICD-585.3) Will recheck kidney function today. in the hospital her creatinine was 2.0 will recheck today her BUN was also a little bit high.  Certainly she looks well hydrated on exam.  Orders: T-Comprehensive Metabolic Panel ([id number])  Problem # 2:  DIABETES MELLITUS II, UNCOMPLICATED (ICD-250.00)  Will give her some short aciting insulin for rescue.  explained that most common reasons for spikes in blood sugar is foods and infection.  Unclear etiology for why this happened.  Reminded her to check her sugar when she feels bad to make sure it is nto related.  A1C is elevated today but she just started insulin a month ago. F/U in 2 months.  Her daughter administers her insulin.  Reminded due for eye exam as well.  Her updated medication list for this problem includes:    Aspirin 81 Mg Tbec (Aspirin) .[name]... Take 1 tablet by mouth once a day    Glucotrol Xl 10 Mg Xr24h-tab (Glipizide) .[name]... Take 1 tablet by mouth once a day    Lantus Solostar 100 Unit/ml Soln (Insulin glargine) ..... Inject 5-10 units daily.  Orders: Fingerstick  (36416) Hemoglobin A1C (83036)  Problem # 3:  COPD (ICD-496) Completed her steroids dan doing well.  Willl continue to monitor her. If has recurrent exacerbations consider Pulm referral.  Her updated medication list for this problem includes:    Advair Diskus 250-50 Mcg/dose Misc (Fluticasone-salmeterol) ..... One inhalation twice daily    Proair Hfa 108 (90 Base) Mcg/act Aers (Albuterol sulfate) .[name]... 2 -4 puffs inhaled every 4 hours as needed sob.    Atrovent Hfa 17 Mcg/act Aers (Ipratropium bromide hfa) .[name]... 2 puffs 4x's daily    Albuterol Sulfate (2.5 Mg/90ml) 0.083% Nebu (Albuterol sulfate) ..... Neb inhaled every 4 hours as need for sob    Spiriva Handihaler 18 Mcg Caps (Tiotropium bromide monohydrate) ..... One capsule inhaled daily.  Complete Medication List: 1)  Advair Diskus 250-50 Mcg/dose Misc (Fluticasone-salmeterol) .... One inhalation twice daily  2)  Proair Hfa 108 (90 Base) Mcg/act Aers (Albuterol sulfate) .... 2 -4 puffs inhaled every 4 hours as needed sob. 3)  Aspirin 81 Mg Tbec (Aspirin) .... Take 1 tablet by mouth once a day 4)  Atrovent Hfa 17 Mcg/act Aers (Ipratropium bromide hfa) .... 2 puffs 4x's daily 5)  Caltrate 600+d Tabs (Calcium carbonate-vitamin d tabs) .... Take 1 tablet by mouth once a day 6)  Glucotrol Xl 10 Mg Xr24h-tab (Glipizide) .... Take 1 tablet by mouth once a day 7)  Loratadine 10 Mg Tabs (Loratadine) .... Take 1 tablet by mouth once a day 8)  Multivitamins Tabs (Multiple vitamin) .... Take 1 tablet by mouth once a day 9)  Nitroglycerin 0.4 Mg Subl (Nitroglycerin) .... As needed chest pain 10)  Sertraline Hcl 100 Mg Tabs (Sertraline hcl) .... Take 1 tablet by mouth once a day 11)  Synthroid 50 Mcg Tabs (Levothyroxine sodium) .... Take 1 tablet by mouth once a day 12)  Triamcinolone Acetonide 0.1 % Crea (Triamcinolone acetonide) .... Apply to heals as needed 13)  Tylenol Arthritis Pain 650 Mg Tbcr (Acetaminophen) .... As needed 14)  Zocor 40 Mg Tabs  (Simvastatin) .... One by mouth at bedtime 15)  True Track Glucometer and Test Strips  .... Dx 250.00 test 2 x a day 16)  Albuterol Sulfate (2.5 Mg/85ml) 0.083% Nebu (Albuterol sulfate) .... Neb inhaled every 4 hours as need for sob 17)  Ferrous Sulfate 324 Mg Tbec (Ferrous sulfate) .[name].. 1 tab by mouth daily 18)  Tramadol Hcl 50 Mg Tabs (Tramadol hcl) .... Two times a day tab as needed for rib pain 19)  Spiriva Handihaler 18 Mcg Caps (Tiotropium bromide monohydrate) .... One capsule inhaled daily. 20)  Lantus Solostar 100 Unit/ml Soln (Insulin glargine) .... Inject 5-10 units daily. 21)  Hydrocodone-acetaminophen 2.5-500 Mg Tabs (Hydrocodone-acetaminophen) .[name].. 1-2 tab at bedtime as needed severe pain 22)  Needles For The Lantus Pen  .... Dx : 250.00 uses once a day  Diabetes Management Assessment/Plan:      Her blood pressure goal is < 130/80.    Patient Instructions: 1)  If you have a day where feel bad then please check your sugar 2)  Follow up in 3 months for diabetes and COPD. 3)  We will call you with your lab results.  4)  Call if any quesions about your sugar 5)  The Novolog sample is for rescue and can use sliding scale for this.    Orders Added: 1)  T-Comprehensive Metabolic Panel [[id number]] 2)  Est. Patient Level IV [04540] 3)  Fingerstick [36416] 4)  Hemoglobin A1C [83036]     Laboratory Results   Blood Tests   Date/Time Received: 10/16/10 Date/Time Reported: 10/16/10  HGBA1C: 8.1%   (Normal Range: Non-Diabetic - 3-6%   Control Diabetic - 6-8%)

## 2010-10-26 ENCOUNTER — Encounter: Payer: Self-pay | Admitting: Family Medicine

## 2010-10-31 ENCOUNTER — Telehealth: Payer: Self-pay | Admitting: Family Medicine

## 2010-11-01 NOTE — Progress Notes (Signed)
  Phone Note Refill Request Message from:  Fax from Pharmacy on October 23, 2010 1:16 PM  Refills Requested: Medication #1:  ZOCOR 40 MG TABS one by mouth at bedtime Initial call taken by: Avon Gully CMA, Duncan Dull),  October 23, 2010 1:17 PM    Prescriptions: ZOCOR 40 MG TABS (SIMVASTATIN) one by mouth at bedtime  #30.0 Each x 1   Entered by:   Avon Gully CMA, (AAMA)   Authorized by:   Nani Gasser MD   Signed by:   Avon Gully CMA, (AAMA) on 10/23/2010   Method used:   Electronically to        [facility]* (retail)       [address]       [place], [place]  [address]       Ph: [phone]       Fax: [phone]   RxID:   [id number]   Appended Document:     Clinical Lists Changes  Medications: Rx of SERTRALINE HCL 100 MG TABS (SERTRALINE HCL) Take 1 tablet by mouth once a day;  #30.0 Each x 1;  Signed;  Entered by: Avon Gully CMA, (AAMA);  Authorized by: Nani Gasser MD;  Method used: Electronically to [facility] [address], [place], [place]  [address], Ph: [phone], Fax: [phone]    Prescriptions: SERTRALINE HCL 100 MG TABS (SERTRALINE HCL) Take 1 tablet by mouth once a day  #30.0 Each x 1   Entered by:   Avon Gully CMA, (AAMA)   Authorized by:   Nani Gasser MD   Signed by:   Avon Gully CMA, (AAMA) on 10/23/2010   Method used:   Electronically to        [facility]* (retail)       [address]       [place], [place]  [address]       Ph: [phone]       Fax: [phone]   RxID:   [id number]

## 2010-11-05 ENCOUNTER — Encounter: Payer: Self-pay | Admitting: Family Medicine

## 2010-11-05 ENCOUNTER — Telehealth (INDEPENDENT_AMBULATORY_CARE_PROVIDER_SITE_OTHER): Payer: Self-pay | Admitting: *Deleted

## 2010-11-06 ENCOUNTER — Telehealth: Payer: Self-pay | Admitting: Family Medicine

## 2010-11-07 NOTE — Progress Notes (Signed)
Summary: meds  Phone Note From Pharmacy   Caller: [facility]* Summary of Call: recieved a refill request for Lisinopril however i see in the note the pt stopped this med because she was passing out and has a dx of Hypotension and is to f/u with cardiologist. DO we refill this? Initial call taken by: Avon Gully CMA, Duncan Dull),  October 31, 2010 10:34 AM  Follow-up for Phone Call        NO don't fill it.  Follow-up by: Nani Gasser MD,  October 31, 2010 12:00 PM  Additional Follow-up for Phone Call Additional follow up Details #1::        noted Additional Follow-up by: Avon Gully CMA, Duncan Dull),  October 31, 2010 1:41 PM

## 2010-11-10 ENCOUNTER — Encounter: Payer: Self-pay | Admitting: Family Medicine

## 2010-11-13 NOTE — Progress Notes (Signed)
Summary: Needs rx for pull ups       New/Updated Medications: * PULL UPS Dx incontinence, urinary Prescriptions: PULL UPS Dx incontinence, urinary  #90 day sup x 3   Entered and Authorized by:   Nani Gasser MD   Signed by:   Nani Gasser MD on 11/06/2010   Method used:   Printed then faxed to ...       [facility]* (retail)       [address]       [place], [place]  [address]       Ph: [phone]       Fax: [phone]   RxID:   [id number]

## 2010-11-13 NOTE — Progress Notes (Signed)
Summary: Verbal order  Phone Note From Other Clinic   Caller: Madonna @ [facility] Summary of Call: Need verbal order for MSW visit. Is this OK? Initial call taken by: Payton Spark CMA,  November 05, 2010 2:11 PM  Follow-up for Phone Call        Yes ok.  Follow-up by: Nani Gasser MD,  November 05, 2010 3:04 PM  Additional Follow-up for Phone Call Additional follow up Details #1::        Madonna aware

## 2010-11-13 NOTE — Consult Note (Signed)
Summary: [facility]  [facility]   Imported By: Maryln Gottron 11/05/2010 12:38:41  _____________________________________________________________________  External Attachment:    Type:   Image     Comment:   External Document

## 2010-11-16 ENCOUNTER — Encounter: Payer: Self-pay | Admitting: Family Medicine

## 2010-11-22 NOTE — Miscellaneous (Signed)
Summary: Missed Visit/[facility]  Missed Visit/[facility]   Imported By: Lanelle Bal 11/15/2010 14:08:02  _____________________________________________________________________  External Attachment:    Type:   Image     Comment:   External Document

## 2010-11-27 NOTE — Miscellaneous (Signed)
Summary: Missed Visit/[facility]  Missed Visit/[facility]   Imported By: Lanelle Bal 11/20/2010 09:22:43  _____________________________________________________________________  External Attachment:    Type:   Image     Comment:   External Document

## 2010-12-04 NOTE — Miscellaneous (Signed)
Summary: Supplemental Order/[facility]  Supplemental Order/[facility]   Imported By: Maryln Gottron 11/27/2010 14:04:01  _____________________________________________________________________  External Attachment:    Type:   Image     Comment:   External Document

## 2010-12-27 ENCOUNTER — Telehealth: Payer: Self-pay | Admitting: Family Medicine

## 2010-12-27 NOTE — Telephone Encounter (Signed)
Lauren from [facility] called with questions re: patient's Rx. Refills for this patient.. Please call her back at [phone]. ThanksVictorino Dike 12-27-10

## 2010-12-28 MED ORDER — SERTRALINE HCL 100 MG PO TABS: 100.0000 mg | ORAL_TABLET | Freq: Every day | ORAL | Status: DC

## 2010-12-28 MED ORDER — SIMVASTATIN 40 MG PO TABS: 40.0000 mg | ORAL_TABLET | Freq: Every evening | ORAL | Status: DC

## 2010-12-28 MED ORDER — SYNTHROID 50 MCG PO TABS: 50.0000 ug | ORAL_TABLET | Freq: Every day | ORAL | Status: DC

## 2011-01-01 ENCOUNTER — Telehealth: Payer: Self-pay | Admitting: Family Medicine

## 2011-01-01 NOTE — Telephone Encounter (Signed)
[facility] needs patient's oxygen levels ASAP

## 2011-01-02 ENCOUNTER — Telehealth: Payer: Self-pay | Admitting: *Deleted

## 2011-01-02 NOTE — Telephone Encounter (Signed)
I put in order as a med. I am not on site so if you can go under meds and order adn "sign" the order adn then we can fax it. I cannot rewrite the origainal order written in 09/21/10.

## 2011-01-02 NOTE — Telephone Encounter (Signed)
PC from [facility] Patient requesting new order for continuous oxygen.  Should read Oxygen 3 liters via nasal canula for continuous use.  Pt would like to travel next week and they are getting orders in place for the appropriate amount of portable oxygen. Theresa Huang also requests copy of last SpO2 result.  This is faxed to her. Please fax order to [phone].

## 2011-01-02 NOTE — Telephone Encounter (Signed)
4th call from [place] today regarding the new order for Theresa Huang.  New request for "original order for oxygen written on 09/21/10" to also be put on new order.  Referral for portable O2 created on that date in [facility].

## 2011-01-02 NOTE — Telephone Encounter (Signed)
RC-spoke to [place] as Casimer Bilis was not in the office at this time.  Advised last O2 Sat we have was 09/21/10 and level was 84%.  She will pass message to [place].

## 2011-01-03 ENCOUNTER — Other Ambulatory Visit: Payer: Self-pay | Admitting: Family Medicine

## 2011-01-03 NOTE — Telephone Encounter (Signed)
Sue Lush, please print order for Dr. Linford Arnold to sign.  Fax number is [phone].  Thanks

## 2011-01-07 MED ORDER — AMBULATORY NON FORMULARY MEDICATION: Status: DC

## 2011-01-29 NOTE — Op Note (Signed)
Theresa Huang, Theresa Huang              ACCOUNT NO.:  [id number]   MEDICAL RECORD NO.:  [id number]          PATIENT TYPE:  AMB   LOCATION:  DAY                          FACILITY:  [facility]   PHYSICIAN:  Thomas A. Cornett, M.D.DATE OF BIRTH:  Dec 04, 1930   DATE OF PROCEDURE:  03/02/2008  DATE OF DISCHARGE:                               OPERATIVE REPORT   PREOPERATIVE DIAGNOSIS:  Grade 3 internal/external hemorrhoid complex.   POSTOPERATIVE DIAGNOSIS:  Grade 3 internal/external hemorrhoid complex.   PROCEDURE:  Three-column hemorrhoidectomy, internal/external complex.   SURGEON:  Maisie Fus A. Cornett, M.D.   ANESTHESIA:  General endotracheal anesthesia with 0.25% Sensorcaine  local.   ESTIMATED BLOOD LOSS:  70 mL.   SPECIMEN:  Hemorrhoidal tissue to pathology.   DRAINS:  None.   INDICATIONS FOR PROCEDURE:  The patient is a 77 year old female who has  had large, bleeding hemorrhoids.  She has been managed medically with  failure.  She is not a candidate for any office-based procedure due to  her size and did not wish to undergo PPH.  She is here today for  hemorrhoidectomy for large, bleeding, symptomatic grade 3 prolapsed  hemorrhoids.   DESCRIPTION OF PROCEDURE:  The patient was brought to the operating  room.  General anesthesia was initiated.  She was then placed in  lithotomy.  The perianal region was prepped and draped in a sterile  fashion.  Digital examination was done.  She had a large amount of stool  in the rectal vault.  I then did an on-table lavage using saline to wash  out her rectum.  I then used a Betadine swab to help clean up the anal  canal as well.  This made it nice and clean and I could visualize  better.  Anoscope was placed.  She had a very large, prolapsed column of  hemorrhoid disease in her left lateral posterior column.  I placed a  stitch in the apex of this.  Using curved Mayos, I laid it flat against  the hemorrhoid and excised the hemorrhoidal tissue,  both internal and  external components, with care taken not to injure the sphincter muscle.  I then oversewed this with the 3-0 Vicryl, leaving the bottom portion of  the wound open for drainage.  Next, in a similar fashion the cluster of  internal and external hemorrhoids in the right lateral position was  identified.  For this column, again a stitch was placed in the apex of  it and then the redundant tissue was trimmed away with curved Mayos.  We  then closed this in a similar fashion with a running 3-0 Monocryl  suture.  She had one very small internal component in the right anterior  position.  I put a stitch in the apex of this and trimmed this easily  with curved Mayos, then closed up the defect.  Of note, she did have a  rectocele.  The remainder of her digital examination was normal.  There  was some mild stenosis at the end of the procedure from the amount of  tissue I had to remove but was  able to easily get my first and second  digits into the anal canal.  Hemostasis was achieved.  The wound was irrigated out with saline.  I  then placed 2% lidocaine jelly and packed it with a Surgicel wrapped  with Gelfoam wrapped with Surgicel.  Dry ABD pad was placed.  She was  taken down from lithotomy.  She is then extubated, taken to recovery in  satisfactory condition.      Thomas A. Cornett, M.D.  Electronically Signed     TAC/MEDQ  D:  03/02/2008  T:  03/02/2008  Job:  [id number]   cc:   Erenest Blank, MD

## 2011-01-29 NOTE — Discharge Summary (Signed)
[name], Theresa Huang              ACCOUNT NO.:  [id number]   MEDICAL RECORD NO.:  [id number]          PATIENT TYPE:  OIB   LOCATION:  1539                         FACILITY[facility]  [facility]   PHYSICIAN:  Thomas A. Cornett, M.D.DATE OF BIRTH:  1931/07/30   DATE OF ADMISSION:  03/02/2008  DATE OF DISCHARGE:  03/03/2008                               DISCHARGE SUMMARY   ADMITTING DIAGNOSES:  1. Grade 2 prolapsed hemorrhoids.  2. Internal-and-external hemorrhoids.   DISCHARGE DIAGNOSES:  1. Grade 2 prolapsed hemorrhoids.  2. Internal-and-external hemorrhoids.   PROCEDURE PERFORMED:  Hemorrhoidectomy.   BRIEF HISTORY:  The patient is a 77 year old female with longstanding,  painful, bleeding hemorrhoids.  She presented for hemorrhoidectomy.  She  was admitted postop due to her advanced age and multiple medical  comorbidities.   HOSPITAL COURSE:  Her hospital course was unremarkable.  She did well  and was discharged home postop day #1 with adequate pain control.   DISCHARGE INSTRUCTIONS:  I will see her back in 3 weeks.  I have  recommended Sitz baths to her 3x a day, if not more.  I gave her a  prescription for Percocet 1 tab q.4 h. as needed.  Also lidocaine jelly  2% to be applied to the anal canal as needed for pain control q.8 h.  She will resume her home medications as documented in the medical  record.   CONDITION AT DISCHARGE:  Improved.      Thomas A. Cornett, M.D.  Electronically Signed     TAC/MEDQ  D:  03/03/2008  T:  03/03/2008  Job:  [id number]

## 2011-02-01 NOTE — Assessment & Plan Note (Signed)
[facility]                     [facility] OFFICE NOTE   Theresa Huang, Theresa Huang                     MRN:          [id number]  DATE:04/16/2006                            DOB:          1931-01-10    REASON FOR CONSULTATION:  Bright red blood per rectum and history of polyps.   ASSESSMENT:  [age]-year-old white woman that had a transanal excision  of a rectal polyp in 2005 ([place]).  She had had bleeding previously; had  a colonoscopy that showed a rectal polypoid lesion.  She went to surgery and  the pathology ended up being hyperplastic.  She had done well since that  time and has not had a colonoscopy since then but is now having intermittent  passage of blood in the stool, water, and tissues.  Sometimes it comes  without a bowel movement.  Ever since her surgery, she has had some urge  incontinence issues; it sounds like it is better than initially.  She says  it is not really disrupting her life.  Occasionally, she is bloated and  distended.  She is trying to lose weight but has not really been successful.  GI review of systems is otherwise negative at this time.   PAST MEDICAL HISTORY:  1.  Hypertension.  2.  Myocardial infarction (silent) in 10/01/2005.  3.  Pneumonia in Oct 01, 2005.  4.  Asthma.  5.  Diabetes mellitus.  6.  Dyslipidemia.  7.  Hypothyroidism.  8.  Osteoarthritis.  9.  Depression.  10. Skin cancer, suspected, removed July 2007.   PAST SURGICAL HISTORY:  Hysterectomy in the past in 1978.   MEDICATIONS:  1.  Advair 250/50 one puff a.m. and p.m.  2.  Atrovent two puffs four times a day.  3.  Proair two puffs four times a day.  4.  Xopenex one puff four times a day.  5.  Lisinopril 10 mg daily.  6.  Glipizide 5 mg twice daily.  7.  Synthroid 50 mcg daily.  8.  Actos 15 mg daily.  9.  Zoloft 100 mg daily.  10. Zocor 20 mg daily.  11. Loratadine 10 mg daily.  12. Aspirin 81 mg daily.  13.  Calcium 600 plus D daily.  14. Mature multivitamin daily.  15. Ambien p.r.n.   ALLERGIES:  She may be allergic to some antibiotic; she does not know the  name.   NOTE:  She had pneumonia in 01/16/07and was hospitalized.   FAMILY HISTORY:  Multiple relatives with diabetes and heart disease.  No  colon cancer.   SOCIAL HISTORY:  She is widowed.  She moved here from [place] several  months ago and is living with her daughter.  She is a retired Lawyer.  Immediately prior to her move, she had cared for her mother who had  Alzheimer's who died of pneumonia in Oct 01, 2022.  She is also divorced from a  second marriage.   REVIEW OF SYSTEMS:  See my medical history form for full details of that as  recorded there.   PHYSICAL EXAMINATION:  GENERAL:  A pleasant, well-developed, obese white  woman,  elderly, in no acute distress.  VITAL SIGNS:  Height 5 feet 4 inches, weight 172 pounds.  Blood pressure  113/59, pulse 67.  HEENT:  Eyes anicteric.  ENT shows upper and lower dentures.  NECK:  Supple.  No thyroid mass.  CHEST:  Clear and resonant.  She is kyphotic.  HEART:  S1 and S2.  No rubs, murmurs, or gallops are heard.  No JVD  observed.  ABDOMEN:  Obese, soft, nontender without organomegaly or mass.  RECTAL:  Deferred at this time.  LYMPHATIC:  No neck or supraclavicular nodes.  EXTREMITIES:  No peripheral edema.  SKIN:  Multiple nevi and moles.  There is a small scar on the upper back  from where she had a skin cancer resected the other week.  NEUROLOGIC:  She is alert and oriented x 3.   DATA:  Data reviewed includes Dr. Shelah Lewandowsky office notes and referral  letter.  Additional medical problems include hearing loss and osteopenia.  She does have a hearing aid in the right ear.  She has stress urinary  incontinence, as well.  There is a history of degenerative disk disease.   Office note from July 27, reviewed.  Records from [place] regarding  colonoscopy, surgical procedures and  pathology reports.   I appreciate the opportunity to care for this patient.                                    Iva Boop, MD, [facility]   CEG/MedQ  DD:  04/16/2006  DT:  04/16/2006  Job #:  [id number]   cc:   Nani Gasser, MD

## 2011-02-01 NOTE — Letter (Signed)
April 16, 2006     Nani Gasser, M.D.  [address], [address]  [place], [place]  [address]   RE:  Theresa, Huang  MRN:  [id number]  /  DOB:  [birth year]   Dear Santina Evans,   Thanks for your kind referral of [name].  She is a 77 year old woman  who is having some rectal bleeding and blood in stool with a history of a  rectal polyp resection in 2005.   I plan for a colonoscopy to evaluate this within the next couple of weeks.   I appreciate the opportunity to care for your patients.    Sincerely,      Iva Boop, MD, [facility]   CEG/MedQ  DD:  04/16/2006  DT:  04/16/2006  Job #:  [id number]

## 2011-02-12 ENCOUNTER — Other Ambulatory Visit: Payer: Self-pay | Admitting: Family Medicine

## 2011-03-07 ENCOUNTER — Telehealth: Payer: Self-pay | Admitting: Family Medicine

## 2011-03-07 NOTE — Telephone Encounter (Signed)
[facility] had sent over an order for a  Signature on this patient, but they have not received it back.  Dated 10/28/10.  Please advise. Jarvis Newcomer, LPN [facility]

## 2011-03-07 NOTE — Telephone Encounter (Signed)
I haven't seen any paperwork on [facility] in a couple weeks. Please have them refax it

## 2011-03-08 NOTE — Telephone Encounter (Signed)
Notified Shawn at [facility] to re-send the paper work for this patient that they need filled out.  Told them we have never received. Jarvis Newcomer, LPN [facility]

## 2011-03-11 ENCOUNTER — Other Ambulatory Visit: Payer: Self-pay | Admitting: Family Medicine

## 2011-03-19 ENCOUNTER — Other Ambulatory Visit: Payer: Self-pay | Admitting: Family Medicine

## 2011-04-01 ENCOUNTER — Telehealth: Payer: Self-pay | Admitting: Family Medicine

## 2011-04-01 NOTE — Telephone Encounter (Signed)
Pt called and said she needs her handicap sticker renewed. Plan:  Pt informed to drop off appropriate paperwork, and then once form completed will call to pup.  Told may be a charge. Jarvis Newcomer, LPN [facility]

## 2011-04-17 ENCOUNTER — Other Ambulatory Visit: Payer: Self-pay | Admitting: Family Medicine

## 2011-04-20 ENCOUNTER — Encounter: Payer: Self-pay | Admitting: Family Medicine

## 2011-04-22 ENCOUNTER — Ambulatory Visit (INDEPENDENT_AMBULATORY_CARE_PROVIDER_SITE_OTHER): Payer: Medicare Other | Admitting: Family Medicine

## 2011-04-22 ENCOUNTER — Encounter: Payer: Self-pay | Admitting: Family Medicine

## 2011-04-22 VITALS — BP 135/67 | HR 72 | Wt 166.0 lb

## 2011-04-22 DIAGNOSIS — I10 Essential (primary) hypertension: Secondary | ICD-10-CM

## 2011-04-22 DIAGNOSIS — M2550 Pain in unspecified joint: Secondary | ICD-10-CM

## 2011-04-22 DIAGNOSIS — E119 Type 2 diabetes mellitus without complications: Secondary | ICD-10-CM

## 2011-04-22 LAB — POCT UA - MICROALBUMIN

## 2011-04-22 MED ORDER — AMBULATORY NON FORMULARY MEDICATION: Status: DC

## 2011-04-22 MED ORDER — TRAMADOL HCL 50 MG PO TABS: 50.0000 mg | ORAL_TABLET | Freq: Three times a day (TID) | ORAL | Status: AC | PRN

## 2011-04-22 NOTE — Assessment & Plan Note (Signed)
Exam upon off the best medication to use on a daily basis for arthritis. I will give her a prescription for tramadol to use as needed for rescue. Consider further workup with rheumatologic labs if she starts to have any redness or swelling in her joints.

## 2011-04-22 NOTE — Progress Notes (Signed)
  Subjective:    Patient ID: Theresa Huang, female    DOB: 1931-05-03, 77 y.o.   MRN: [id number]  Diabetes She presents for her follow-up diabetic visit. She has type 2 diabetes mellitus. Her disease course has been stable. Pertinent negatives for diabetes include no chest pain, no polydipsia and no polyuria. Hypoglycemia complications include required glucagon injection. Symptoms are stable. Risk factors for coronary artery disease include diabetes mellitus. Current diabetic treatment includes oral agent (monotherapy). She is compliant with treatment all of the time. Her breakfast blood glucose range is generally 90-110 mg/dl.   she has some burning in her left fifth toe when she wears her sneakers. Otherwise it doesn't bother her but she wanted it looked at today. She does have a blister that is healing well the top of the left great. No other lesions.  She would like a medication for arthritis in her hands. She says she has tried Tylenol and Aleve in either one helps her symptoms. She wants to know what all she could take for pain relief. She says it seems to be worse sometimes at night when she tried to go to sleep. Sometimes she will experience burning in addition to pain.  Her right calf is larger than her left calf. She's not having any pain but recently noticed the size difference. No trauma.      Review of Systems  Cardiovascular: Negative for chest pain.  Genitourinary: Negative for polyuria.  Hematological: Negative for polydipsia.       Objective:   Physical Exam  Constitutional: She is oriented to person, place, and time. She appears well-developed and well-nourished.  HENT:  Head: Normocephalic and atraumatic.  Cardiovascular: Normal rate, regular rhythm and normal heart sounds.   Pulmonary/Chest: Effort normal and breath sounds normal.  Neurological: She is alert and oriented to person, place, and time.  Skin: Skin is warm and dry.  Psychiatric: She has a normal mood  and affect. Her behavior is normal.   At the base of her left foot and the ball of her foot she has a slightly brown pigmented spot that appears to look somewhat like a corn. On her left fifth toe I see no lesions or trauma or swelling. This is where she said she occasionally gets numbness when she wears her tissues. On her right great toe dorsally there is a blister that appears to be dry and healing. No evidence of infection.       Assessment & Plan:

## 2011-04-22 NOTE — Assessment & Plan Note (Signed)
Her A1c looks great so we will decrease her Lantus to 5 units. If at followup in 3 month she's still doing fantastic family might be a little hold the Lantus completely. Continue eating healthy diet and working on regular exercise as much as possible. Her monofilament exam was normal today. We did discuss getting better fitting shoe wear and that he thinks is getting too much compression on her toes. We talked about getting a wider toe box. Urine albumin is negative.

## 2011-04-22 NOTE — Assessment & Plan Note (Signed)
BP at goal today. No other episodes of hypotension.

## 2011-04-22 NOTE — Patient Instructions (Addendum)
Decrease your lantus to 5 units.  Hold your lantus if your sugar is under 80.  Follow up in 3 months.

## 2011-04-29 ENCOUNTER — Encounter: Payer: Self-pay | Admitting: *Deleted

## 2011-05-06 ENCOUNTER — Ambulatory Visit (INDEPENDENT_AMBULATORY_CARE_PROVIDER_SITE_OTHER): Payer: Medicare Other | Admitting: Family Medicine

## 2011-05-06 ENCOUNTER — Other Ambulatory Visit: Payer: Self-pay | Admitting: Family Medicine

## 2011-05-06 ENCOUNTER — Encounter: Payer: Self-pay | Admitting: Family Medicine

## 2011-05-06 VITALS — BP 119/64 | HR 55 | Wt 163.0 lb

## 2011-05-06 DIAGNOSIS — D229 Melanocytic nevi, unspecified: Secondary | ICD-10-CM

## 2011-05-06 DIAGNOSIS — D2370 Other benign neoplasm of skin of unspecified lower limb, including hip: Secondary | ICD-10-CM

## 2011-05-06 DIAGNOSIS — D239 Other benign neoplasm of skin, unspecified: Secondary | ICD-10-CM

## 2011-05-06 NOTE — Progress Notes (Signed)
  Subjective:    Patient ID: Theresa Huang, female    DOB: 07-Jun-1931, 77 y.o.   MRN: [id number]  HPI She has a lesion on the right inner lower leg that needs to be  Bx.  She has a new lesion on the right upper thigh and right inner knee that are similar. No pain or itching.     Review of Systems     Objective:   Physical Exam Right lower leg with a hypertrophic dry scaly lesion with a red base. NO active infection or drainage.         Assessment & Plan:  Atypical lesion - bx to eval for squamous cell vs possible wart, esp since has new lesions.   Shave Biopsy Procedure Note  Pre-operative Diagnosis: Squamous cell carcinoma  Post-operative Diagnosis: unsure  Locations:right calf and inner lower leg  Indications: has changed in size  Anesthesia: Lidocaine 1% without epinephrine with added sodium bicarbonate  Procedure Details  History of allergy to iodine: no  Patient informed of the risks (including bleeding and infection) and benefits of the  procedure and Verbal informed consent obtained.  The lesion and surrounding area were given a sterile prep using betadyne and draped in the usual sterile fashion. A scalpel was used to shave an area of skin approximately 0.6cm by 0.6 cm.  Hemostasis achieved with alumuninum chloride. Antibiotic ointment and a sterile dressing applied.  The specimen was sent for pathologic examination. The patient tolerated the procedure well.  EBL: 0 ml  Findings: Atypical lesion  Condition: Stable  Complications: none.  Plan: 1. Instructed to keep the wound dry and covered for 24-48h and clean thereafter. 2. Warning signs of infection were reviewed.   3. Recommended that the patient use tylenol as needed for pain.  4. Return prn

## 2011-05-07 ENCOUNTER — Telehealth: Payer: Self-pay | Admitting: Family Medicine

## 2011-05-07 LAB — COMPLETE METABOLIC PANEL WITH GFR
AST: 30 U/L (ref 0–37)
Albumin: 3.9 g/dL (ref 3.5–5.2)
Alkaline Phosphatase: 74 U/L (ref 39–117)
BUN: 28 mg/dL — ABNORMAL HIGH (ref 6–23)
Potassium: 4.7 mEq/L (ref 3.5–5.3)
Sodium: 140 mEq/L (ref 135–145)
Total Protein: 6.9 g/dL (ref 6.0–8.3)

## 2011-05-07 LAB — LIPID PANEL
Cholesterol: 173 mg/dL (ref 0–200)
Triglycerides: 84 mg/dL (ref <150)

## 2011-05-07 NOTE — Telephone Encounter (Signed)
Call patient: Kidney function look stable. In fact actually looks better this time. Cholesterol looks fantastic.

## 2011-05-07 NOTE — Telephone Encounter (Signed)
Pt.notified

## 2011-05-08 ENCOUNTER — Telehealth: Payer: Self-pay | Admitting: Family Medicine

## 2011-05-08 NOTE — Telephone Encounter (Signed)
Pt's daughter notified.

## 2011-05-08 NOTE — Telephone Encounter (Signed)
Call patient: The specimen is most consistent from her biopsy with a wart. That means that we can do freezing on the other lesions that she has whenever she would like.

## 2011-05-17 ENCOUNTER — Telehealth: Payer: Self-pay | Admitting: *Deleted

## 2011-05-17 ENCOUNTER — Other Ambulatory Visit: Payer: Self-pay | Admitting: Family Medicine

## 2011-05-17 MED ORDER — CEPHALEXIN 500 MG PO CAPS: 500.0000 mg | ORAL_CAPSULE | Freq: Four times a day (QID) | ORAL | Status: AC

## 2011-05-17 NOTE — Telephone Encounter (Signed)
rx sent

## 2011-05-17 NOTE — Telephone Encounter (Signed)
Pt calls and states the biopsy that was done on her leg area is still really red around the site and green in the middle. No streaking present. Per Dr. Linford Arnold- she will call in antibiotic and if no better on Monday will need an appointment.

## 2011-06-13 LAB — BASIC METABOLIC PANEL
BUN: 23
Calcium: 10
GFR calc non Af Amer: 42 — ABNORMAL LOW
Potassium: 5
Sodium: 142

## 2011-06-13 LAB — CBC
HCT: 34.6 — ABNORMAL LOW
MCV: 93.2
Platelets: 239
RDW: 12.7

## 2011-06-13 LAB — DIFFERENTIAL
Basophils Absolute: 0
Eosinophils Absolute: 0.2
Eosinophils Relative: 3
Lymphocytes Relative: 27

## 2011-06-13 LAB — HEMOGLOBIN A1C: Hgb A1c MFr Bld: 6.7 — ABNORMAL HIGH

## 2011-06-17 ENCOUNTER — Other Ambulatory Visit: Payer: Self-pay | Admitting: Family Medicine

## 2011-07-16 ENCOUNTER — Other Ambulatory Visit: Payer: Self-pay | Admitting: Family Medicine

## 2011-07-19 ENCOUNTER — Other Ambulatory Visit: Payer: Self-pay | Admitting: Family Medicine

## 2011-07-24 ENCOUNTER — Other Ambulatory Visit: Payer: Self-pay | Admitting: Family Medicine

## 2011-09-15 ENCOUNTER — Other Ambulatory Visit: Payer: Self-pay | Admitting: Family Medicine

## 2011-09-16 ENCOUNTER — Other Ambulatory Visit: Payer: Self-pay | Admitting: Family Medicine

## 2011-09-19 ENCOUNTER — Other Ambulatory Visit: Payer: Self-pay | Admitting: Family Medicine

## 2011-10-07 ENCOUNTER — Other Ambulatory Visit: Payer: Self-pay | Admitting: *Deleted

## 2011-10-07 MED ORDER — GLUCOSE BLOOD VI STRP: ORAL_STRIP | Status: DC

## 2011-10-09 ENCOUNTER — Other Ambulatory Visit: Payer: Self-pay | Admitting: *Deleted

## 2011-10-09 MED ORDER — GLUCOSE BLOOD VI STRP: ORAL_STRIP | Status: DC

## 2011-11-15 ENCOUNTER — Other Ambulatory Visit: Payer: Self-pay | Admitting: Family Medicine

## 2011-12-16 ENCOUNTER — Other Ambulatory Visit: Payer: Self-pay | Admitting: Family Medicine

## 2012-01-28 ENCOUNTER — Other Ambulatory Visit: Payer: Self-pay | Admitting: *Deleted

## 2012-01-28 NOTE — Telephone Encounter (Signed)
Pt states she is having low back pain again and would like to know if you will send her some Tramadol to the pharmacy. Please advise.

## 2012-01-28 NOTE — Telephone Encounter (Signed)
She needs an appt.  Also hasn't been seen for her diabetes since August.  Should be seen every 3-4 months for her diabetes.

## 2012-01-29 NOTE — Telephone Encounter (Signed)
Pt states that she will call back to schedule b/c she has to get a ride and needs to see when they are available.

## 2012-02-13 ENCOUNTER — Other Ambulatory Visit: Payer: Self-pay | Admitting: Family Medicine

## 2012-03-15 ENCOUNTER — Other Ambulatory Visit: Payer: Self-pay | Admitting: Family Medicine

## 2012-03-16 ENCOUNTER — Other Ambulatory Visit: Payer: Self-pay | Admitting: *Deleted

## 2012-03-16 ENCOUNTER — Other Ambulatory Visit: Payer: Self-pay | Admitting: Family Medicine

## 2012-03-16 ENCOUNTER — Ambulatory Visit: Payer: Medicare Other | Admitting: Family Medicine

## 2012-03-16 MED ORDER — LORATADINE 10 MG PO TABS: 10.0000 mg | ORAL_TABLET | Freq: Every day | ORAL | Status: DC

## 2012-03-16 MED ORDER — SIMVASTATIN 40 MG PO TABS: 40.0000 mg | ORAL_TABLET | Freq: Every day | ORAL | Status: DC

## 2012-03-16 MED ORDER — INSULIN GLARGINE 100 UNIT/ML ~~LOC~~ SOLN: 10.0000 [IU] | Freq: Every day | SUBCUTANEOUS | Status: DC

## 2012-03-16 MED ORDER — FERROUS SULFATE 325 (65 FE) MG PO TABS: 325.0000 mg | ORAL_TABLET | Freq: Every day | ORAL | Status: DC

## 2012-03-16 MED ORDER — GLIPIZIDE ER 10 MG PO TB24: 10.0000 mg | ORAL_TABLET | Freq: Every day | ORAL | Status: DC

## 2012-03-16 MED ORDER — ALBUTEROL SULFATE HFA 108 (90 BASE) MCG/ACT IN AERS: 2.0000 | INHALATION_SPRAY | RESPIRATORY_TRACT | Status: DC | PRN

## 2012-03-24 ENCOUNTER — Encounter: Payer: Self-pay | Admitting: Family Medicine

## 2012-03-24 ENCOUNTER — Ambulatory Visit (INDEPENDENT_AMBULATORY_CARE_PROVIDER_SITE_OTHER): Payer: Medicare PPO | Admitting: Family Medicine

## 2012-03-24 VITALS — BP 111/58 | HR 78 | Ht 63.0 in | Wt 166.0 lb

## 2012-03-24 DIAGNOSIS — E119 Type 2 diabetes mellitus without complications: Secondary | ICD-10-CM

## 2012-03-24 DIAGNOSIS — E039 Hypothyroidism, unspecified: Secondary | ICD-10-CM

## 2012-03-24 DIAGNOSIS — Z9181 History of falling: Secondary | ICD-10-CM

## 2012-03-24 DIAGNOSIS — I10 Essential (primary) hypertension: Secondary | ICD-10-CM

## 2012-03-24 DIAGNOSIS — Z1331 Encounter for screening for depression: Secondary | ICD-10-CM

## 2012-03-24 LAB — COMPLETE METABOLIC PANEL WITH GFR
Albumin: 4.2 g/dL (ref 3.5–5.2)
CO2: 30 mEq/L (ref 19–32)
Calcium: 10.1 mg/dL (ref 8.4–10.5)
Chloride: 104 mEq/L (ref 96–112)
GFR, Est African American: 39 mL/min — ABNORMAL LOW
GFR, Est Non African American: 34 mL/min — ABNORMAL LOW
Glucose, Bld: 106 mg/dL — ABNORMAL HIGH (ref 70–99)
Potassium: 4.4 mEq/L (ref 3.5–5.3)
Sodium: 141 mEq/L (ref 135–145)
Total Protein: 7 g/dL (ref 6.0–8.3)

## 2012-03-24 NOTE — Progress Notes (Signed)
  Subjective:    Patient ID: Theresa Huang, female    DOB: [birth year], 78 y.o.   MRN: [id number]  Diabetes She presents for her follow-up diabetic visit. She has type 2 diabetes mellitus. Her disease course has been stable. There are no hypoglycemic associated symptoms. Associated symptoms include visual change. Pertinent negatives for diabetes include no chest pain, no foot paresthesias, no foot ulcerations, no polydipsia, no polyphagia and no polyuria. (Problems with right eye - Plans on seeing Dr.Moya.) Symptoms are stable. She is compliant with treatment most of the time. Her weight is stable. There is no change in her home blood glucose trend.      Review of Systems  Cardiovascular: Negative for chest pain.  Genitourinary: Negative for polyuria.  Hematological: Negative for polydipsia and polyphagia.       Objective:   Physical Exam  Constitutional: She is oriented to person, place, and time. She appears well-developed and well-nourished.  HENT:  Head: Normocephalic and atraumatic.  Cardiovascular: Normal rate, regular rhythm and normal heart sounds.   Pulmonary/Chest: Effort normal and breath sounds normal.  Neurological: She is alert and oriented to person, place, and time.  Skin: Skin is warm and dry.  Psychiatric: She has a normal mood and affect. Her behavior is normal.          Assessment & Plan:  DM- well controlled. Continue current regimen. No changes. She says she has enough refills until the end of the month. She can have the pharmacy call us. She will followup in 3 months I reminded her again that she needs to come see me every 3-4 months when she has a history of diabetes. She would be due for your micron been at that point in time. She says she has an eye appointment later today. Anchors her to have the eye doctor send me a copy of the report. I am concerned that she had some loss of vision in her right eye temporarily and encouraged her to make sure she makes  the appointment as this can be very serious. Continue work on eating a healthy diet and getting regular exercise. He really needs to be on an ACE inhibitor but today her blood pressure systolic is 111. Hesitate to start something today and drop her blood pressure even lower.  Hypothyroidism-we have not checked her thyroid and well over a year. Would like to recheck that today to make sure that we do not need to adjust her dose.  HTN - doing well.  Continue current regimen. She's due for CMP and fasting lipid panel.  Depression screen-PHQ 9 score of 5. She answered no to the second question feeling down depressed or hopeless, meaning this is a negative screen for depression.  Fall risk assessment-score of 9, moderate. Will give her a handout with information to prevent falls in the home.

## 2012-03-30 ENCOUNTER — Telehealth: Payer: Self-pay | Admitting: *Deleted

## 2012-03-30 NOTE — Telephone Encounter (Signed)
Pt informed

## 2012-03-30 NOTE — Telephone Encounter (Signed)
Pt is requesting a letter from you that needs to be addressed to the electric company stating that she is on oxygen and that she needs power at all times due to the oxygen. Please advise.

## 2012-03-30 NOTE — Telephone Encounter (Signed)
Ok for the below. Can print and I will fax.

## 2012-04-13 ENCOUNTER — Ambulatory Visit (INDEPENDENT_AMBULATORY_CARE_PROVIDER_SITE_OTHER): Payer: Medicaid Other | Admitting: Physician Assistant

## 2012-04-13 ENCOUNTER — Encounter: Payer: Self-pay | Admitting: Physician Assistant

## 2012-04-13 ENCOUNTER — Other Ambulatory Visit: Payer: Self-pay | Admitting: Family Medicine

## 2012-04-13 VITALS — BP 125/58 | HR 69 | Temp 98.1°F | Ht 63.0 in | Wt 170.0 lb

## 2012-04-13 DIAGNOSIS — N390 Urinary tract infection, site not specified: Secondary | ICD-10-CM

## 2012-04-13 DIAGNOSIS — M2550 Pain in unspecified joint: Secondary | ICD-10-CM

## 2012-04-13 DIAGNOSIS — R300 Dysuria: Secondary | ICD-10-CM

## 2012-04-13 LAB — POCT URINALYSIS DIPSTICK
Bilirubin, UA: NEGATIVE
Ketones, UA: NEGATIVE
Nitrite, UA: NEGATIVE

## 2012-04-13 MED ORDER — TRAMADOL HCL 50 MG PO TABS: 50.0000 mg | ORAL_TABLET | Freq: Three times a day (TID) | ORAL | Status: AC | PRN

## 2012-04-13 MED ORDER — SULFAMETHOXAZOLE-TMP DS 800-160 MG PO TABS: 1.0000 | ORAL_TABLET | Freq: Two times a day (BID) | ORAL | Status: AC

## 2012-04-13 NOTE — Patient Instructions (Addendum)
Stay hydrated. Bactrim for 3 days twice a day. Tramadol for pain as needed.   Urinary Tract Infection Infections of the urinary tract can start in several places. A bladder infection (cystitis), a kidney infection (pyelonephritis), and a prostate infection (prostatitis) are different types of urinary tract infections (UTIs). They usually get better if treated with medicines (antibiotics) that kill germs. Take all the medicine until it is gone. You or your child may feel better in a few days, but TAKE ALL MEDICINE or the infection may not respond and may become more difficult to treat. HOME CARE INSTRUCTIONS   Drink enough water and fluids to keep the urine clear or pale yellow. Cranberry juice is especially recommended, in addition to large amounts of water.   Avoid caffeine, tea, and carbonated beverages. They tend to irritate the bladder.   Alcohol may irritate the prostate.   Only take over-the-counter or prescription medicines for pain, discomfort, or fever as directed by your caregiver.  To prevent further infections:  Empty the bladder often. Avoid holding urine for long periods of time.   After a bowel movement, women should cleanse from front to back. Use each tissue only once.   Empty the bladder before and after sexual intercourse.  FINDING OUT THE RESULTS OF YOUR TEST Not all test results are available during your visit. If your or your child's test results are not back during the visit, make an appointment with your caregiver to find out the results. Do not assume everything is normal if you have not heard from your caregiver or the medical facility. It is important for you to follow up on all test results. SEEK MEDICAL CARE IF:   There is back pain.   Your baby is older than 3 months with a rectal temperature of 100.5 F (38.1 C) or higher for more than 1 day.   Your or your child's problems (symptoms) are no better in 3 days. Return sooner if you or your child is getting  worse.  SEEK IMMEDIATE MEDICAL CARE IF:   There is severe back pain or lower abdominal pain.   You or your child develops chills.   You have a fever.   Your baby is older than 3 months with a rectal temperature of 102 F (38.9 C) or higher.   Your baby is 38 months old or younger with a rectal temperature of 100.4 F (38 C) or higher.   There is nausea or vomiting.   There is continued burning or discomfort with urination.  MAKE SURE YOU:   Understand these instructions.   Will watch your condition.   Will get help right away if you are not doing well or get worse.  Document Released: 06/12/2005 Document Revised: 08/22/2011 Document Reviewed: 01/15/2007 [facility] Patient Information 2012 [place], [place].

## 2012-04-13 NOTE — Progress Notes (Signed)
  Subjective:    Patient ID: Theresa Huang, female    DOB: 08/19/1931, 78 y.o.   MRN: [id number]  HPI Patient presents with 2 weeks of painful urination and increased frequency. Denies any fever or chills. She is trying to stay hydrated by drinking more fluids. She has abdominal pain and pressure but denies any back/flank pain. Denies and GI symptoms or bowel changes. No urinary discharge.She is in chronic ongoing pain due to polyarthralgia. She reports that she has been given Tramadol in the past and used it PRN for pain.     Review of Systems     Objective:   Physical Exam  Constitutional: She is oriented to person, place, and time. She appears well-developed and well-nourished.  HENT:  Head: Normocephalic and atraumatic.  Cardiovascular: Normal rate, regular rhythm and normal heart sounds.   Pulmonary/Chest: Effort normal and breath sounds normal. She has no wheezes.       No CVA tenderness.  Abdominal: Soft. Bowel sounds are normal. She exhibits no distension and no mass. There is no tenderness. There is no guarding.  Neurological: She is alert and oriented to person, place, and time.  Skin: Skin is warm and dry.  Psychiatric: She has a normal mood and affect. Her behavior is normal.          Assessment & Plan:  UTI/polyarthrigia- UA positive for leuks and blood. Stay hydrated. Bactrim for 3 days twice a day. Tramadol for pain as needed. Do not use tramadol and tylenol together.

## 2012-05-06 ENCOUNTER — Other Ambulatory Visit: Payer: Self-pay | Admitting: Family Medicine

## 2012-06-18 ENCOUNTER — Other Ambulatory Visit: Payer: Self-pay | Admitting: Family Medicine

## 2012-06-25 ENCOUNTER — Ambulatory Visit: Payer: Medicare Other | Admitting: Family Medicine

## 2012-06-25 DIAGNOSIS — Z0289 Encounter for other administrative examinations: Secondary | ICD-10-CM

## 2012-07-07 ENCOUNTER — Ambulatory Visit: Payer: Medicare PPO | Admitting: Family Medicine

## 2012-07-09 ENCOUNTER — Encounter: Payer: Self-pay | Admitting: Family Medicine

## 2012-07-09 ENCOUNTER — Ambulatory Visit (INDEPENDENT_AMBULATORY_CARE_PROVIDER_SITE_OTHER): Payer: Medicare PPO | Admitting: Family Medicine

## 2012-07-09 VITALS — BP 108/51 | HR 70 | Ht 63.0 in | Wt 166.0 lb

## 2012-07-09 DIAGNOSIS — E119 Type 2 diabetes mellitus without complications: Secondary | ICD-10-CM

## 2012-07-09 DIAGNOSIS — R42 Dizziness and giddiness: Secondary | ICD-10-CM

## 2012-07-09 DIAGNOSIS — R296 Repeated falls: Secondary | ICD-10-CM

## 2012-07-09 DIAGNOSIS — Z23 Encounter for immunization: Secondary | ICD-10-CM

## 2012-07-09 DIAGNOSIS — Z9181 History of falling: Secondary | ICD-10-CM

## 2012-07-09 DIAGNOSIS — L659 Nonscarring hair loss, unspecified: Secondary | ICD-10-CM

## 2012-07-09 LAB — POCT GLYCOSYLATED HEMOGLOBIN (HGB A1C): Hemoglobin A1C: 6

## 2012-07-09 LAB — POCT UA - MICROALBUMIN: Microalbumin Ur, POC: 10 mg/dL

## 2012-07-09 MED ORDER — NAPROXEN 500 MG PO TABS: 500.0000 mg | ORAL_TABLET | Freq: Two times a day (BID) | ORAL | Status: DC | PRN

## 2012-07-09 NOTE — Progress Notes (Signed)
Subjective:    Patient ID: Theresa Huang, female    DOB: Nov 18, 1930, 78 y.o.   MRN: [id number]  HPI DM- Sugars runnin 88-130.  She is taking her insulin 5-10 units. Also on glucotrol. Has had some chest pain and took some NTG and it resolved.  Didn't tell a family member.  Did have night sweat one night but didn't check her sugar.  NO wounds that aren't healing well.    Frequent falls. - She fell on Sat while ironing.  Unclear how she fell.  Hit her left hip and her right elbow. Able to walk wihtout her hip hurting. Just sore. Says has been more dizzy lately. She is not on any blood pressure pill. No HA.  Dizziness is often positional.  Has nearly fallen several times.  No CP occ SOB with activities like walking up stairs. She is also not very active. + sedentary.   Hair Loss - Balding spot near the front of her hair line. Noticed it a couple of months ago. Non tender or itchy.    Review of Systems   BP 108/51  Pulse 70  Ht 5\' 3"  (1.6 m)  Wt 166 lb (75.297 kg)  BMI 29.41 kg/m2    Allergies  Allergen Reactions  . Tetracycline     REACTION: arms swelling, itching, blisters    Past Medical History  Diagnosis Date  . DDD (degenerative disc disease)   . PNA (pneumonia)   . Inflammatory polyps of colon with rectal bleeding   . Hemorrhoids   . Retinopathy of left eye   . Hearing loss of both ears     wears bilateral hearing aids  . Glaucoma   . Diastolic dysfunction     Past Surgical History  Procedure Date  . Squamous cell removed   . Eye surgery     cataracts    History   Social History  . Marital Status: Widowed    Spouse Name: N/A    Number of Children: N/A  . Years of Education: N/A   Occupational History  . Not on file.   Social History Main Topics  . Smoking status: Former Smoker    Quit date: 09/17/1987  . Smokeless tobacco: Not on file  . Alcohol Use: No  . Drug Use: No  . Sexually Active:    Other Topics Concern  . Not on file   Social History  Narrative  . No narrative on file    Family History  Problem Relation Age of Onset  . Depression Mother   . Diabetes Mother   . Hyperlipidemia Daughter     Outpatient Encounter Prescriptions as of 07/09/2012  Medication Sig Dispense Refill  . acetaminophen (TYLENOL ARTHRITIS PAIN) 650 MG CR tablet Take 650 mg by mouth as needed.        [name] ADVAIR DISKUS 250-50 MCG/DOSE AEPB INHALE 1 PUFF TWICE DAILY  60 each  2  . albuterol (PROVENTIL) (2.5 MG/3ML) 0.083% nebulizer solution Take 2.5 mg by nebulization every 4 (four) hours as needed.        . ALLERGY 10 MG tablet TAKE 1 TABLET BY MOUTH ONCE DAILY  30 tablet  2  . AMBULATORY NON FORMULARY MEDICATION Medication Name: Home continuous oxygen.  3 liters. Please provide portable prn. Dx: COPD  1 Device  prn  . aspirin 81 MG tablet Take 81 mg by mouth daily.        . Calcium-Vitamin D (CALTRATE 600 PLUS-VIT D PO) Take by  mouth daily.        . Haynes Kerns Lancets Super Thin MISC TEST TWICE DAILY  100 each  1  . EPINEPHrine (EPI-PEN) 0.3 mg/0.3 mL DEVI USE AS DIRECTED  2 Device  3  . ferrous sulfate 325 (65 FE) MG tablet TAKE 1 TABLET BY MOUTH ONCE DAILY WITH BREAKFAST  30 tablet  2  . glucose blood (TRUETRACK TEST) test strip Use as instructed  100 each  prn  . GLUCOTROL XL 10 MG 24 hr tablet TAKE ONE TABLET BY MOUTH EVERY DAY  30 tablet  2  . ipratropium (ATROVENT HFA) 17 MCG/ACT inhaler Inhale 2 puffs into the lungs 4 (four) times daily.        . Multiple Vitamin (MULTIVITAMIN) tablet Take 1 tablet by mouth daily.        . nitroGLYCERIN (NITROSTAT) 0.4 MG SL tablet TAKE ONE TABLET AS NEEDEDFOR CHEST PAIN  25 tablet  0  . PROAIR HFA 108 (90 BASE) MCG/ACT inhaler USE 2-4 PUFFS BY MOUTH EVERY 4 HOURS AS NEEDED FOR SHORTNESS OF BREATH  8.5 each  2  . SPIRIVA HANDIHALER 18 MCG inhalation capsule INHALE 1 CAPSULE BY MOUTH DAILY  30 each  2  . SYNTHROID 50 MCG tablet TAKE ONE TABLET BY MOUTH EVERY DAY  30 tablet  2  . triamcinolone (KENALOG) 0.1 % cream  Apply topically as needed.        [name] ZOCOR 40 MG tablet TAKE ONE TABLET BY MOUTH AT BEDTIME  30 tablet  2  . ZOLOFT 100 MG tablet TAKE ONE TABLET BY MOUTH EVERY DAY  30 tablet  2  . DISCONTD: insulin glargine (LANTUS) 100 UNIT/ML injection Inject 5 Units into the skin at bedtime. PENS      . DISCONTD: Insulin Pen Needle (PEN NEEDLES 29GX1/2") 29G X MISC USE ONCE DAILY AS DIRECTED  100 each  3  . naproxen (NAPROSYN) 500 MG tablet Take 1 tablet (500 mg total) by mouth 2 (two) times daily as needed.  30 tablet  0      \    Objective:   Physical Exam  Constitutional: She is oriented to person, place, and time. She appears well-developed and well-nourished.  HENT:  Head: Normocephalic and atraumatic.  Cardiovascular: Normal rate, regular rhythm and normal heart sounds.   Pulmonary/Chest: Effort normal and breath sounds normal.  Neurological: She is alert and oriented to person, place, and time.  Skin: Skin is warm and dry.       Approx 3 cm round area of hairloss with loss of hair follicles.  No scale or rash.   Psychiatric: She has a normal mood and affect. Her behavior is normal.          Assessment & Plan:  DM-Her a1c is 6.0 today which is fantastic.  We will stop her insulin. Am worried that the sweats that she had the other night couldn't a hypoglycemic event. Her blood sugars look very well-controlled. I did warn her to make sure she's not eat getting into the candy which is her weakness is reported to stop her insulin. Continue Glucotrol for now. Followup in 3 months.  Frequent falls-unclear etiology. Concerned about the dizziness. Her blood pressures are low today especially considering it she's not actually on blood pressure medications. Consider that she could be having bradycardic events. I would like her to consider getting cardiology. Also rec carotid dopplers.    Hypotension/Dizziness - she is not on blood pressure medications. Because  of her dizziness episodes are like  to refer her to cardiology for further evaluations. Consider an echocardiogram to check out her bowels. I hear no murmur on exam today. Also consider cardiac monitoring for bradycardia or possible arrhythmia. Her EKG looks okay today. Rate of 63 beats per minute. Normal axis. Normal sinus rhythm. No acute changes. This is unchanged from previous EKG done on October 2011. Scanned into the system. We will also check blood work to rule out thyroid abnormalities. She does have hypothyroidism but her level was normal in July. Also rule out anemia.  Hair loss - appear to be alopecia areata. Will refer to derm.

## 2012-07-10 ENCOUNTER — Encounter: Payer: Self-pay | Admitting: *Deleted

## 2012-07-10 LAB — CBC WITH DIFFERENTIAL/PLATELET
Basophils Absolute: 0 10*3/uL (ref 0.0–0.1)
Eosinophils Absolute: 0.2 10*3/uL (ref 0.0–0.7)
Eosinophils Relative: 3 % (ref 0–5)
HCT: 38.1 % (ref 36.0–46.0)
Lymphocytes Relative: 38 % (ref 12–46)
MCH: 30.4 pg (ref 26.0–34.0)
MCV: 91.1 fL (ref 78.0–100.0)
Monocytes Absolute: 0.5 10*3/uL (ref 0.1–1.0)
RDW: 12.7 % (ref 11.5–15.5)
WBC: 6.2 10*3/uL (ref 4.0–10.5)

## 2012-07-10 LAB — TSH: TSH: 2.928 u[IU]/mL (ref 0.350–4.500)

## 2012-07-13 ENCOUNTER — Telehealth: Payer: Self-pay | Admitting: *Deleted

## 2012-07-13 NOTE — Telephone Encounter (Signed)
Message copied by Florestine Avers on Mon Jul 13, 2012 11:04 AM ------      Message from: Nani Gasser D      Created: Sun Jul 12, 2012  9:22 PM       Call pt: hemoglobin is better and iron stores are much better.  B12 is great! Thyrod is nl.  If taking any iron supplements can stop them now

## 2012-07-14 ENCOUNTER — Ambulatory Visit (HOSPITAL_BASED_OUTPATIENT_CLINIC_OR_DEPARTMENT_OTHER)
Admission: RE | Admit: 2012-07-14 | Discharge: 2012-07-14 | Disposition: A | Discharge status: Home / Self Care | Payer: Medicare PPO | Source: Ambulatory Visit | Attending: Family Medicine | Admitting: Family Medicine

## 2012-07-14 ENCOUNTER — Ambulatory Visit: Payer: Medicare PPO | Admitting: Family Medicine

## 2012-07-14 DIAGNOSIS — I6529 Occlusion and stenosis of unspecified carotid artery: Secondary | ICD-10-CM | POA: Insufficient documentation

## 2012-07-14 DIAGNOSIS — R42 Dizziness and giddiness: Secondary | ICD-10-CM | POA: Insufficient documentation

## 2012-07-14 DIAGNOSIS — R55 Syncope and collapse: Secondary | ICD-10-CM | POA: Insufficient documentation

## 2012-07-14 DIAGNOSIS — E119 Type 2 diabetes mellitus without complications: Secondary | ICD-10-CM | POA: Insufficient documentation

## 2012-07-16 ENCOUNTER — Encounter: Payer: Self-pay | Admitting: Family Medicine

## 2012-07-16 ENCOUNTER — Ambulatory Visit (INDEPENDENT_AMBULATORY_CARE_PROVIDER_SITE_OTHER): Payer: Medicare PPO | Admitting: Family Medicine

## 2012-07-16 VITALS — BP 119/54 | HR 79 | Ht 63.0 in | Wt 166.0 lb

## 2012-07-16 DIAGNOSIS — I6521 Occlusion and stenosis of right carotid artery: Secondary | ICD-10-CM

## 2012-07-16 DIAGNOSIS — R42 Dizziness and giddiness: Secondary | ICD-10-CM

## 2012-07-16 DIAGNOSIS — I6529 Occlusion and stenosis of unspecified carotid artery: Secondary | ICD-10-CM

## 2012-07-16 NOTE — Progress Notes (Signed)
  Subjective:    Patient ID: Theresa Huang, female    DOB: 1931-06-30, 78 y.o.   MRN: [id number]  HPI She's here today to followup for her dizziness. She says she's doing about the same not really worse or better. We did stop her insulin her last office visit because her A1c was well-controlled and she was mostly only taking about 5 units anyway. Also we stopped her iron because her ferritin stores were back up into the normal range. Hopefully this will help with her bowels. She did have her carotid ultrasound performed yesterday and is here to discuss those results as well. She does have her cardiology appointment scheduled in about 2 weeks with Dr. Olga Millers.   Review of Systems     Objective:   Physical Exam  Constitutional: She is oriented to person, place, and time. She appears well-developed and well-nourished.  HENT:  Head: Normocephalic and atraumatic.  Cardiovascular: Normal rate, regular rhythm and normal heart sounds.        No carotid bruits  Pulmonary/Chest: Effort normal and breath sounds normal.  Neurological: She is alert and oriented to person, place, and time.  Skin: Skin is warm and dry.  Psychiatric: She has a normal mood and affect. Her behavior is normal.          Assessment & Plan:  CArtoid aretery stenosis- we reviewed her results. She does have significant stenosis of the right carotid artery. I would like to refer her to a vein and vascular specialist for further evaluation and to discuss treatment options. For now To her that it's important to keep blood pressure under good control and to make sure she's taking her cholesterol medication regularly.  Dizziness-she does have a cardiology appointment in 2 weeks to make sure there's not a cardiac cause. We will also refer her to vascular surgeon for further evaluation of her right carotid artery stenosis. I would like to see her back in 8 weeks. That way is the specials feel that it's not related to person  his or her heart and we can continue with her workup.  Recommend see ENT again for her hoarseness adn weakness in her voice.

## 2012-07-17 ENCOUNTER — Other Ambulatory Visit: Payer: Self-pay

## 2012-07-17 DIAGNOSIS — R55 Syncope and collapse: Secondary | ICD-10-CM

## 2012-07-17 DIAGNOSIS — I6529 Occlusion and stenosis of unspecified carotid artery: Secondary | ICD-10-CM

## 2012-07-29 ENCOUNTER — Ambulatory Visit (INDEPENDENT_AMBULATORY_CARE_PROVIDER_SITE_OTHER): Payer: Medicaid Other | Admitting: Cardiology

## 2012-07-29 ENCOUNTER — Encounter: Payer: Self-pay | Admitting: Cardiology

## 2012-07-29 VITALS — BP 125/67 | HR 73 | Wt 166.0 lb

## 2012-07-29 DIAGNOSIS — I679 Cerebrovascular disease, unspecified: Secondary | ICD-10-CM

## 2012-07-29 DIAGNOSIS — R55 Syncope and collapse: Secondary | ICD-10-CM

## 2012-07-29 DIAGNOSIS — R42 Dizziness and giddiness: Secondary | ICD-10-CM

## 2012-07-29 NOTE — Assessment & Plan Note (Signed)
Dizziness/syncope-etiology is not clear. She has had intermittent episodes for several years. Her LV function is normal on previous echocardiograms. She is not orthostatic in the office today. Question bradycardia mediated. Plan CardioNet to further assess. She will also need a Myoview to exclude ischemia.

## 2012-07-29 NOTE — Progress Notes (Signed)
HPI: 78 year old female for evaluation of dizziness and syncope. Carotid Dopplers in October of 2013 showed a 70-99% right carotid stenosis. There was a < 50% left carotid stenosis. Patient referred to vascular surgery. Recent laboratories show normal hemoglobin and TSH. BUN and creatinine in July of 2013 32 and 1.46. Patient had an echocardiogram in November of 2011 at [facility] that showed normal LV function and mild left atrial enlargement. Echocardiogram repeated in February of 2012 in [place]. LV function was normal. There was grade 1 diastolic dysfunction. There was mild left atrial enlargement and mild pulmonary hypertension. The patient has had intermittent dizzy spells for several years. She did have a syncopal episode in 2011 evaluated at [facility] and with [facility] cardi[facility]. Her episodes always occur when standing. They typically occur 15 minutes after she is up. She suddenly feels lightheaded without associated dyspnea, chest pain or palpitations. Her symptoms resolved after 1-2 minutes. 2 weeks ago she did have a frank syncopal episode in her kitchen. There was no seizure activity. Cardiology is now asked to evaluate. Note she does have home O2 dependent COPD and has some dyspnea on exertion. There is no orthopnea, PND, pedal edema or exertional chest pain. She does occasionally have a pain in her left chest for several minutes.  It is not exertional, pleuritic or related to food. It does not radiate. She has had this intermittently since age 87.  Current Outpatient Prescriptions  Medication Sig Dispense Refill  . acetaminophen (TYLENOL ARTHRITIS PAIN) 650 MG CR tablet Take 650 mg by mouth as needed.        [name] ADVAIR DISKUS 250-50 MCG/DOSE AEPB INHALE 1 PUFF TWICE DAILY  60 each  2  . albuterol (PROVENTIL) (2.5 MG/3ML) 0.083% nebulizer solution Take 2.5 mg by nebulization every 4 (four) hours as needed.        . ALLERGY 10 MG tablet TAKE 1 TABLET BY MOUTH ONCE DAILY   30 tablet  2  . AMBULATORY NON FORMULARY MEDICATION Medication Name: Home continuous oxygen.  3 liters. Please provide portable prn. Dx: COPD  1 Device  prn  . aspirin 81 MG tablet Take 81 mg by mouth daily.        . Calcium-Vitamin D (CALTRATE 600 PLUS-VIT D PO) Take by mouth daily.        . Haynes Kerns Lancets Super Thin MISC TEST TWICE DAILY  100 each  1  . EPINEPHrine (EPI-PEN) 0.3 mg/0.3 mL DEVI USE AS DIRECTED  2 Device  3  . glucose blood (TRUETRACK TEST) test strip Use as instructed  100 each  prn  . GLUCOTROL XL 10 MG 24 hr tablet TAKE ONE TABLET BY MOUTH EVERY DAY  30 tablet  2  . ipratropium (ATROVENT HFA) 17 MCG/ACT inhaler Inhale 2 puffs into the lungs 4 (four) times daily.        . Multiple Vitamin (MULTIVITAMIN) tablet Take 1 tablet by mouth daily.        . naproxen (NAPROSYN) 500 MG tablet Take 1 tablet (500 mg total) by mouth 2 (two) times daily as needed.  30 tablet  0  . nitroGLYCERIN (NITROSTAT) 0.4 MG SL tablet TAKE ONE TABLET AS NEEDEDFOR CHEST PAIN  25 tablet  0  . PROAIR HFA 108 (90 BASE) MCG/ACT inhaler USE 2-4 PUFFS BY MOUTH EVERY 4 HOURS AS NEEDED FOR SHORTNESS OF BREATH  8.5 each  2  . SPIRIVA HANDIHALER 18 MCG inhalation capsule INHALE 1 CAPSULE BY MOUTH DAILY  30 each  2  . SYNTHROID 50 MCG tablet TAKE ONE TABLET BY MOUTH EVERY DAY  30 tablet  2  . triamcinolone (KENALOG) 0.1 % cream Apply topically as needed.        [name] ZOCOR 40 MG tablet TAKE ONE TABLET BY MOUTH AT BEDTIME  30 tablet  2  . ZOLOFT 100 MG tablet TAKE ONE TABLET BY MOUTH EVERY DAY  30 tablet  2    Allergies  Allergen Reactions  . Tetracycline     REACTION: arms swelling, itching, blisters    Past Medical History  Diagnosis Date  . DDD (degenerative disc disease)   . PNA (pneumonia)   . Inflammatory polyps of colon with rectal bleeding   . Hemorrhoids   . Retinopathy of left eye   . Hearing loss of both ears     wears bilateral hearing aids  . Glaucoma   . Diastolic dysfunction     Past  Surgical History  Procedure Date  . Squamous cell removed   . Eye surgery     cataracts    History   Social History  . Marital Status: Widowed    Spouse Name: N/A    Number of Children: N/A  . Years of Education: N/A   Occupational History  . Not on file.   Social History Main Topics  . Smoking status: Former Smoker    Quit date: 09/17/1987  . Smokeless tobacco: Not on file  . Alcohol Use: No  . Drug Use: No  . Sexually Active:    Other Topics Concern  . Not on file   Social History Narrative  . No narrative on file    Family History  Problem Relation Age of Onset  . Depression Mother   . Diabetes Mother   . Hyperlipidemia Daughter     ROS: no fevers or chills, productive cough, hemoptysis, dysphasia, odynophagia, melena, hematochezia, dysuria, hematuria, rash, seizure activity, orthopnea, PND, pedal edema, claudication. Remaining systems are negative.  Physical Exam:   There were no vitals taken for this visit.  General:  Well developed/well nourished in NAD Skin warm/dry Patient not depressed No peripheral clubbing Back-normal HEENT-normal/normal eyelids Neck supple/normal carotid upstroke bilaterally; no bruits; no JVD; no thyromegaly chest - mildly diminished breath sounds throughout. CV - RRR/normal S1 and S2; no murmurs, rubs or gallops;  PMI nondisplaced Abdomen -NT/ND, no HSM, no mass, + bowel sounds, no bruit 2+ femoral pulses, no bruits Ext-no edema, chords, 2+ DP Neuro-grossly nonfocal  ECG 07/09/2012-sinus rhythm at a rate of 63 and no ST changes.

## 2012-07-29 NOTE — Patient Instructions (Signed)
Your physician recommends that you schedule a follow-up appointment in: 8 WEEKS WITH DR Jens Som [place]  Your physician has requested that you have a lexiscan myoview. For further information please visit [email or web address] Please follow instruction sheet, as given.   Your physician has recommended that you wear an event monitor. Event monitors are medical devices that record the heart's electrical activity. Doctors most often [place] these monitors to diagnose arrhythmias. Arrhythmias are problems with the speed or rhythm of the heartbeat. The monitor is a small, portable device. You can wear one while you do your normal daily activities. This is usually used to diagnose what is causing palpitations/syncope (passing out).

## 2012-07-29 NOTE — Assessment & Plan Note (Signed)
Patient has been referred to vascular surgery and may require carotid endarterectomy. Continue aspirin and statin. Will arrange Myoview for risk stratification preoperatively.

## 2012-08-05 ENCOUNTER — Encounter: Payer: Self-pay | Admitting: Cardiology

## 2012-08-05 ENCOUNTER — Other Ambulatory Visit: Payer: Medicare PPO

## 2012-08-05 ENCOUNTER — Encounter: Payer: Medicare PPO | Admitting: Vascular Surgery

## 2012-08-05 DIAGNOSIS — R55 Syncope and collapse: Secondary | ICD-10-CM

## 2012-08-06 ENCOUNTER — Encounter: Payer: Self-pay | Admitting: Vascular Surgery

## 2012-08-07 ENCOUNTER — Ambulatory Visit (INDEPENDENT_AMBULATORY_CARE_PROVIDER_SITE_OTHER): Payer: Medicare PPO | Admitting: Vascular Surgery

## 2012-08-07 ENCOUNTER — Other Ambulatory Visit (INDEPENDENT_AMBULATORY_CARE_PROVIDER_SITE_OTHER): Payer: Medicare PPO | Admitting: *Deleted

## 2012-08-07 ENCOUNTER — Encounter: Payer: Self-pay | Admitting: Vascular Surgery

## 2012-08-07 VITALS — BP 143/70 | HR 63 | Resp 18 | Ht 63.0 in | Wt 165.0 lb

## 2012-08-07 DIAGNOSIS — I658 Occlusion and stenosis of other precerebral arteries: Secondary | ICD-10-CM

## 2012-08-07 DIAGNOSIS — R55 Syncope and collapse: Secondary | ICD-10-CM

## 2012-08-07 DIAGNOSIS — I6529 Occlusion and stenosis of unspecified carotid artery: Secondary | ICD-10-CM

## 2012-08-07 DIAGNOSIS — I6523 Occlusion and stenosis of bilateral carotid arteries: Secondary | ICD-10-CM

## 2012-08-07 NOTE — Progress Notes (Signed)
[facility] [place] CONSULT NOTE 08/07/2012 DOB: [id number] MRN : [id number]  [id number] stenosis right Referring Physician:Catherine Randell Patient, MD  History of Present Illness: This is a 78 y.o. Female with new complaints of dizziness and falling episode that happened 1 month ago.  Prior to the fall she was not on any blood pressure pill. No HA or CP.  She reports no changes in vision.  Her voice is soft and has a history of vocal cord problems.  She has a cardiac monitor and is going to follow up with Dr. Jens Som.   The patient denies prior CVA or TIA.  She denies any amarosis fugax or monocular blindness.  She denies any facial drooping or hemiplegia.  She denies any expressive or receptive aphasia.  The patient has had vocal cord issues requiring silicone injections in the past.  The etiology of vocal cord issues is unknown.  The patient does not know if she has cord paralysis.   Past Medical History  Diagnosis Date  . DDD (degenerative disc disease)   . Inflammatory polyps of colon with rectal bleeding   . Hemorrhoids   . Retinopathy of left eye   . Hearing loss of both ears     wears bilateral hearing aids  . Glaucoma   . Diastolic dysfunction   . Diabetes mellitus   . Hypertension   . Hyperlipidemia   . COPD (chronic obstructive pulmonary disease)   . Hypothyroid   . Anemia   . Irregular heart beat   . DVT (deep venous thrombosis)   . Cancer     SCC    Past Surgical History  Procedure Date  . Squamous cell removed   . Eye surgery     cataracts  . Hemorrhoid surgery   . Abdominal hysterectomy   . Throat surgery   . Tonsillectomy     Social History History  Substance Use Topics  . Smoking status: Former Smoker    Types: Cigarettes    Quit date: 09/17/1987  . Smokeless tobacco: Never Used  . Alcohol Use: No    Family History Family History  Problem Relation Age of Onset  . Depression Mother   . Diabetes Mother   . CAD Mother   . Heart  disease Mother   . Hyperlipidemia Daughter   . Cancer Daughter   . CAD Father   . Heart disease Father   . Diabetes Brother   . Hypertension Son     Allergies  Allergen Reactions  . Tetracycline     REACTION: arms swelling, itching, blisters    Current Outpatient Prescriptions  Medication Sig Dispense Refill  . acetaminophen (TYLENOL ARTHRITIS PAIN) 650 MG CR tablet Take 650 mg by mouth as needed.        [name] ADVAIR DISKUS 250-50 MCG/DOSE AEPB INHALE 1 PUFF TWICE DAILY  60 each  2  . albuterol (PROVENTIL) (2.5 MG/3ML) 0.083% nebulizer solution Take 2.5 mg by nebulization every 4 (four) hours as needed.        . ALLERGY 10 MG tablet TAKE 1 TABLET BY MOUTH ONCE DAILY  30 tablet  2  . AMBULATORY NON FORMULARY MEDICATION Medication Name: Home continuous oxygen.  3 liters. Please provide portable prn. Dx: COPD  1 Device  prn  . aspirin 81 MG tablet Take 81 mg by mouth daily.        . Calcium-Vitamin D (CALTRATE 600 PLUS-VIT D PO) Take by mouth daily.        [name]  E-Z Ject Lancets Super Thin MISC TEST TWICE DAILY  100 each  1  . EPINEPHrine (EPI-PEN) 0.3 mg/0.3 mL DEVI USE AS DIRECTED  2 Device  3  . glucose blood (TRUETRACK TEST) test strip Use as instructed  100 each  prn  . GLUCOTROL XL 10 MG 24 hr tablet TAKE ONE TABLET BY MOUTH EVERY DAY  30 tablet  2  . ipratropium (ATROVENT HFA) 17 MCG/ACT inhaler Inhale 2 puffs into the lungs 4 (four) times daily.        . Multiple Vitamin (MULTIVITAMIN) tablet Take 1 tablet by mouth daily.        . naproxen (NAPROSYN) 500 MG tablet Take 1 tablet (500 mg total) by mouth 2 (two) times daily as needed.  30 tablet  0  . nitroGLYCERIN (NITROSTAT) 0.4 MG SL tablet TAKE ONE TABLET AS NEEDEDFOR CHEST PAIN  25 tablet  0  . PROAIR HFA 108 (90 BASE) MCG/ACT inhaler USE 2-4 PUFFS BY MOUTH EVERY 4 HOURS AS NEEDED FOR SHORTNESS OF BREATH  8.5 each  2  . SPIRIVA HANDIHALER 18 MCG inhalation capsule INHALE 1 CAPSULE BY MOUTH DAILY  30 each  2  . SYNTHROID 50 MCG  tablet TAKE ONE TABLET BY MOUTH EVERY DAY  30 tablet  2  . triamcinolone (KENALOG) 0.1 % cream Apply topically as needed.        [name] ZOCOR 40 MG tablet TAKE ONE TABLET BY MOUTH AT BEDTIME  30 tablet  2  . ZOLOFT 100 MG tablet TAKE ONE TABLET BY MOUTH EVERY DAY  30 tablet  2      ROS: [x]  Positive  [ ]  Denies    General: [ ]  Weight loss, [ ]  Fever, [ ]  chills Neurologic: [x]  Dizziness, [ ]  Blackouts, [ ]  Seizure [ ]  Stroke, [ ]  "Mini stroke", [ ]  Slurred speech, [ ]  Temporary blindness; [ ]  weakness in arms or legs, [x]  Hoarseness Cardiac: [ ]  Chest pain/pressure, [ ]  Shortness of breath at rest [ ]  Shortness of breath with exertion, [ ]  Atrial fibrillation or irregular heartbeat, [x]  cardiac arrhythmia Vascular: [ ]  Pain in legs with walking, [ ]  Pain in legs at rest, [ ]  Pain in legs at night,  [ ]  Non-healing ulcer, [ ]  Blood clot in vein/DVT,   Pulmonary: [ ]  Home oxygen, [ ]  Productive cough, [ ]  Coughing up blood, [ ]  Asthma,  [ ]  Wheezing Musculoskeletal:  [ ]  Arthritis, [ ]  Low back pain, [ ]  Joint pain Hematologic: [ ]  Easy Bruising, [ ]  Anemia; [ ]  Hepatitis Gastrointestinal: [ ]  Blood in stool, [ ]  Gastroesophageal Reflux/heartburn, [ ]  Trouble swallowing Urinary: [ ]  chronic Kidney disease, [ ]  on HD - [ ]  MWF or [ ]  TTHS, [ ]  Burning with urination, [ ]  Difficulty urinating Skin: [ ]  Rashes, [ ]  Wounds Psychological: [ ]  Anxiety, [ ]  Depression   Physical Examination Filed Vitals:   08/07/12 1156 08/07/12 1159  BP: 148/74 143/70  Pulse: 63 63  Resp: 18   Height: 5\' 3"  (1.6 m)   Weight: 165 lb (74.844 kg)   SpO2: 94%    General:  WDWN in NAD Gait: Normal HENT: WNL Eyes: Pupils equal Pulmonary: normal non-labored breathing , without Rales, rhonchi,  wheezing Cardiac: RRR, without  Murmurs, rubs or gallops; No carotid bruits Abdomen: soft, NT, no masses Skin: no rashes, ulcers noted Vascular Exam/Pulses:PT palpated bilaterally, femoral pulses bilaterally and radial  pulses bilaterally. Cranial nerves are  in tact. No facial drooping or tongue deviation is noted Extremities without ischemic changes, no Gangrene , no cellulitis; no open wounds;  Musculoskeletal: no muscle wasting or atrophy  Neurologic: A&O X 3; Appropriate Affect ;  SENSATION: normal; MOTOR FUNCTION: Pt has good and equal strength in all extremities - 5/5 Speech is fluent/very soft voice due to known vocal cord problems "hoarseness"  Non-Invasive Vascular Imaging: B Carotid duplex (Date: 08/07/12)  R: 80-99%, 542/175 c/s  B VA antegrade and patent  L: <40%  ASSESSMENT/PLAN: Carotid stenosis sever on the right asymptomatic.  This will require carotid endarterectomy by Dr. Imogene Burn.  She will F/U with her cardiologist Dr. Jens Som and they will get cardiac clearance and preop optimization  She will F/U with Dr. Imogene Burn in 2 weeks to discuss surgery plans.  Shawnette Augello MAUREEN PA-C  Addendum  I have independently interviewed and examined the patient, and I agree with the physician assistant's findings.  Pt has asx R ICA stenosis > 80%.  Per [facility]AS, I would recommended R CEA if cardiac risk is acceptable.  Patient has follow up with her cardiologist already next week.  We will see her back in 2 weeks to see if she an acceptable cardiac risk.  Leonides Sake, MD [facility] [place] Office: [phone] Pager: [phone]  08/07/2012, 3:18 PM

## 2012-08-10 ENCOUNTER — Ambulatory Visit (HOSPITAL_COMMUNITY): Payer: Medicare PPO | Attending: Cardiovascular Disease | Admitting: Radiology

## 2012-08-10 VITALS — BP 169/82 | HR 58 | Ht 63.0 in | Wt 159.0 lb

## 2012-08-10 DIAGNOSIS — I6529 Occlusion and stenosis of unspecified carotid artery: Secondary | ICD-10-CM

## 2012-08-10 DIAGNOSIS — R55 Syncope and collapse: Secondary | ICD-10-CM | POA: Insufficient documentation

## 2012-08-10 DIAGNOSIS — R0789 Other chest pain: Secondary | ICD-10-CM

## 2012-08-10 DIAGNOSIS — R0602 Shortness of breath: Secondary | ICD-10-CM

## 2012-08-10 DIAGNOSIS — Z01810 Encounter for preprocedural cardiovascular examination: Secondary | ICD-10-CM

## 2012-08-10 MED ORDER — TECHNETIUM TC 99M SESTAMIBI GENERIC - CARDIOLITE
30.0000 | Freq: Once | INTRAVENOUS | Status: AC | PRN
Start: 2012-08-10 — End: 2012-08-10
  Administered 2012-08-10: 30 via INTRAVENOUS

## 2012-08-10 MED ORDER — TECHNETIUM TC 99M SESTAMIBI GENERIC - CARDIOLITE
10.0000 | Freq: Once | INTRAVENOUS | Status: AC | PRN
  Administered 2012-08-10: 10 via INTRAVENOUS

## 2012-08-10 MED ORDER — REGADENOSON 0.4 MG/5ML IV SOLN
0.4000 mg | Freq: Once | INTRAVENOUS | Status: AC
  Administered 2012-08-10: 0.4 mg via INTRAVENOUS

## 2012-08-10 NOTE — Progress Notes (Signed)
[facility] SITE 3 NUCLEAR MED [address] [id number] [place] [address] [phone]  Cardiology Nuclear Med Study  Theresa Huang is a 78 y.o. female     MRN : [id number]     DOB: [birth year]  Procedure Date: 08/10/2012  Nuclear Med Background Indication for Stress Test:  Evaluation for Ischemia and Clearance for Pending (R) Carotid Endarterectomy History:  '77 MI-type 2 (per pt); ~15 yrs GXT:OK per patient; '12 Echo:Normal Cardiac Risk Factors: Carotid Disease, CVA, Family History - CAD, History of Smoking and NIDDM  Symptoms:  Chest Pain/Pressure.  (last episode of chest discomfort was about a month ago), Dizziness, DOE/SOB, Near Syncope and Syncope about 2-weeks ago   Nuclear Pre-Procedure Caffeine/Decaff Intake:  None NPO After: 7:00pm   Lungs:  Clear. O2 Sat: 97% on room air. IV 0.9% NS with Angio Cath:  22g  IV Site: R Hand  IV Started by:  Cathlyn Parsons, RN  Chest Size (in):  38 Cup Size: C  Height: 5\' 3"  (1.6 m)  Weight:  159 lb (72.122 kg)  BMI:  Body mass index is 28.17 kg/(m^2). Tech Comments:  n/a    Nuclear Med Study 1 or 2 day study: 1 day  Stress Test Type:  Lexiscan  Reading MD: Charlton Haws, MD  Order Authorizing Provider:  Olga Millers, MD  Resting Radionuclide: Technetium 58m Sestamibi  Resting Radionuclide Dose: 10.4 mCi   Stress Radionuclide:  Technetium 85m Sestamibi  Stress Radionuclide Dose: 32.8 mCi           Stress Protocol Rest HR: 58 Stress HR: 93  Rest BP: 169/82 Stress BP: 176/79  Exercise Time (min): n/a METS: n/a   Predicted Max HR: 141 bpm % Max HR: 65.96 bpm Rate Pressure Product: 62952   Dose of Adenosine (mg):  n/a Dose of Lexiscan: 0.4 mg  Dose of Atropine (mg): n/a Dose of Dobutamine: n/a mcg/kg/min (at max HR)  Stress Test Technologist: Smiley Houseman, CMA-N  Nuclear Technologist:  Domenic Polite, CNMT     Rest Procedure:  Myocardial perfusion imaging was performed at rest 45 minutes  following the intravenous administration of Technetium 68m Sestamibi.  Rest ECG: Occasional PVC's, short burst PSVT; otherwise WNL.  Stress Procedure:  The patient received IV Lexiscan 0.4 mg over 15-seconds.  Technetium 18m Sestamibi was injected at 30-seconds.  There were no significant changes with Lexiscan, occasional PVC's/PAC's were noted.  Quantitative spect images were obtained after a 45 minute delay.  Stress ECG: No significant ST segment change suggestive of ischemia.  QPS Raw Data Images:  Patient motion noted; appropriate software correction applied. Stress Images:  Normal homogeneous uptake in all areas of the myocardium. Rest Images:  Normal homogeneous uptake in all areas of the myocardium. Subtraction (SDS):  No evidence of ischemia. Transient Ischemic Dilatation (Normal <1.22):  0.87 Lung/Heart Ratio (Normal <0.45):  0.33  Quantitative Gated Spect Images QGS EDV:  62 ml QGS ESV:  13 ml  Impression Exercise Capacity:  Lexiscan with no exercise. BP Response:  Normal blood pressure response. Clinical Symptoms:  nausea ECG Impression:  No significant ST segment change suggestive of ischemia. Comparison with Prior Nuclear Study: No previous nuclear study performed  Overall Impression:  Normal stress nuclear study.  This is a LOW RISK scan.  LV Ejection Fraction: 79%.  LV Wall Motion:  Normal Wall Motion.  Willa Rough, MD

## 2012-08-11 ENCOUNTER — Other Ambulatory Visit: Payer: Self-pay | Admitting: Family Medicine

## 2012-08-20 ENCOUNTER — Encounter: Payer: Self-pay | Admitting: Vascular Surgery

## 2012-08-21 ENCOUNTER — Encounter: Payer: Self-pay | Admitting: *Deleted

## 2012-08-21 ENCOUNTER — Other Ambulatory Visit: Payer: Self-pay | Admitting: *Deleted

## 2012-08-21 ENCOUNTER — Encounter: Payer: Self-pay | Admitting: Vascular Surgery

## 2012-08-21 ENCOUNTER — Ambulatory Visit (INDEPENDENT_AMBULATORY_CARE_PROVIDER_SITE_OTHER): Payer: Medicare PPO | Admitting: Vascular Surgery

## 2012-08-21 VITALS — BP 121/55 | HR 72 | Resp 16 | Ht 63.0 in | Wt 167.0 lb

## 2012-08-21 DIAGNOSIS — I6529 Occlusion and stenosis of unspecified carotid artery: Secondary | ICD-10-CM

## 2012-08-21 NOTE — Progress Notes (Signed)
[facility] [place]  Established Carotid Patient  History of Present Illness  Theresa Huang is a 78 y.o. (11/02/1930) female who presents with chief complaint:  R carotid stenosis > 80%.  Patient has been seen by Cardiology and cleared for surgery.  Pt has had no stroke or TIA sx.  Past Medical History   Diagnosis  Date   .  DDD (degenerative disc disease)    .  Inflammatory polyps of colon with rectal bleeding    .  Hemorrhoids    .  Retinopathy of left eye    .  Hearing loss of both ears      wears bilateral hearing aids   .  Glaucoma    .  Diastolic dysfunction    .  Diabetes mellitus    .  Hypertension    .  Hyperlipidemia    .  COPD (chronic obstructive pulmonary disease)    .  Hypothyroid    .  Anemia    .  Irregular heart beat    .  DVT (deep venous thrombosis)    .  Cancer      SCC    Past Surgical History   Procedure  Date   .  Squamous cell removed    .  Eye surgery      cataracts   .  Hemorrhoid surgery    .  Abdominal hysterectomy    .  Throat surgery    .  Tonsillectomy     Social History  History   Substance Use Topics   .  Smoking status:  Former Smoker     Types:  Cigarettes     Quit date:  09/17/1987   .  Smokeless tobacco:  Never Used   .  Alcohol Use:  No    Family History  Family History   Problem  Relation  Age of Onset   .  Depression  Mother    .  Diabetes  Mother    .  CAD  Mother    .  Heart disease  Mother    .  Hyperlipidemia  Daughter    .  Cancer  Daughter    .  CAD  Father    .  Heart disease  Father    .  Diabetes  Brother    .  Hypertension  Son     Allergies   Allergen  Reactions   .  Tetracycline      REACTION: arms swelling, itching, blisters    Current Outpatient Prescriptions   Medication  Sig  Dispense  Refill   .  acetaminophen (TYLENOL ARTHRITIS PAIN) 650 MG CR tablet  Take 650 mg by mouth as needed.     .  ADVAIR DISKUS 250-50 MCG/DOSE AEPB  INHALE 1 PUFF TWICE DAILY  60 each  2   .   albuterol (PROVENTIL) (2.5 MG/3ML) 0.083% nebulizer solution  Take 2.5 mg by nebulization every 4 (four) hours as needed.     .  ALLERGY 10 MG tablet  TAKE 1 TABLET BY MOUTH ONCE DAILY  30 tablet  2   .  AMBULATORY NON FORMULARY MEDICATION  Medication Name: Home continuous oxygen. 3 liters. Please provide portable prn.  Dx: COPD  1 Device  prn   .  aspirin 81 MG tablet  Take 81 mg by mouth daily.     .  Calcium-Vitamin D (CALTRATE 600 PLUS-VIT D PO)  Take by mouth daily.     .    E-Z Ject Lancets Super Thin MISC  TEST TWICE DAILY  100 each  1   .  EPINEPHrine (EPI-PEN) 0.3 mg/0.3 mL DEVI  USE AS DIRECTED  2 Device  3   .  glucose blood (TRUETRACK TEST) test strip  Use as instructed  100 each  prn   .  GLUCOTROL XL 10 MG 24 hr tablet  TAKE ONE TABLET BY MOUTH EVERY DAY  30 tablet  2   .  ipratropium (ATROVENT HFA) 17 MCG/ACT inhaler  Inhale 2 puffs into the lungs 4 (four) times daily.     .  Multiple Vitamin (MULTIVITAMIN) tablet  Take 1 tablet by mouth daily.     .  naproxen (NAPROSYN) 500 MG tablet  Take 1 tablet (500 mg total) by mouth 2 (two) times daily as needed.  30 tablet  0   .  nitroGLYCERIN (NITROSTAT) 0.4 MG SL tablet  TAKE ONE TABLET AS NEEDEDFOR CHEST PAIN  25 tablet  0   .  PROAIR HFA 108 (90 BASE) MCG/ACT inhaler  USE 2-4 PUFFS BY MOUTH EVERY 4 HOURS AS NEEDED FOR SHORTNESS OF BREATH  8.5 each  2   .  SPIRIVA HANDIHALER 18 MCG inhalation capsule  INHALE 1 CAPSULE BY MOUTH DAILY  30 each  2   .  SYNTHROID 50 MCG tablet  TAKE ONE TABLET BY MOUTH EVERY DAY  30 tablet  2   .  triamcinolone (KENALOG) 0.1 % cream  Apply topically as needed.     .  ZOCOR 40 MG tablet  TAKE ONE TABLET BY MOUTH AT BEDTIME  30 tablet  2   .  ZOLOFT 100 MG tablet  TAKE ONE TABLET BY MOUTH EVERY DAY  30 tablet  2    ROS: [x] Positive [ ] Denies  General: [ ] Weight loss, [ ] Fever, [ ] chills  Neurologic: [x] Dizziness, [ ] Blackouts, [ ] Seizure [ ] Stroke, [ ] "Mini stroke", [ ] Slurred speech, [ ]  Temporary blindness; [ ] weakness in arms or legs, [x] Hoarseness  Cardiac: [ ] Chest pain/pressure, [ ] Shortness of breath at rest [ ] Shortness of breath with exertion, [ ] Atrial fibrillation or irregular heartbeat, [x] cardiac arrhythmia  Vascular: [ ] Pain in legs with walking, [ ] Pain in legs at rest, [ ] Pain in legs at night, [ ] Non-healing ulcer, [ ] Blood clot in vein/DVT,  Pulmonary: [ ] Home oxygen, [ ] Productive cough, [ ] Coughing up blood, [ ] Asthma, [ ] Wheezing  Musculoskeletal: [ ] Arthritis, [ ] Low back pain, [ ] Joint pain  Hematologic: [ ] Easy Bruising, [ ] Anemia; [ ] Hepatitis  Gastrointestinal: [ ] Blood in stool, [ ] Gastroesophageal Reflux/heartburn, [ ] Trouble swallowing  Urinary: [ ] chronic Kidney disease, [ ] on HD - [ ] MWF or [ ] TTHS, [ ] Burning with urination, [ ] Difficulty urinating  Skin: [ ] Rashes, [ ] Wounds  Psychological: [ ] Anxiety, [ ] Depression   Physical Examination  Filed Vitals:   08/21/12 1350 08/21/12 1355  BP: 110/53 121/55  Pulse: 70 72  Resp: 16   Height: 5' 3" (1.6 m)   Weight: 167 lb (75.751 kg)   SpO2: 95%    Body mass index is 29.58 kg/(m^2).  General: A&O x 3, WDWN  Eyes: PERRLA, EOMI  Pulmonary: Sym exp, good air movt, CTAB, no rales, rhonchi, & wheezing    Cardiac: RRR, Nl S1, S2, no Murmurs, rubs or gallops  Gastrointestinal: soft, NTND, -G/R, - HSM, - masses, - CVAT B  Musculoskeletal: M/S 5/5 throughout , Extremities without ischemic changes   Neurologic: CN 2-12 intact , Pain and light touch intact in extremities , Motor exam as listed above  Medical Decision Making  Theresa Huang is a 78 y.o. female who presents with: asx R ICA stenosis > 80%   Based on the patient's vascular studies and examination, I have offered the patient: R CEA, scheduled for 17 DEC 13.  I discussed in depth with the patient the nature of atherosclerosis, and emphasized the importance of maximal medical management  including strict control of blood pressure, blood glucose, and lipid levels, antiplatelet agents, obtaining regular exercise, and cessation of smoking.  The patient is aware that without maximal medical management the underlying atherosclerotic disease process will progress, limiting the benefit of any interventions.  Thank you for allowing us to participate in this patient's care.  Guadalupe Nickless, MD [facility] [place] Office: [phone] Pager: [phone]  08/21/2012, 2:27 PM     

## 2012-08-24 ENCOUNTER — Encounter (HOSPITAL_COMMUNITY): Payer: Self-pay | Admitting: Pharmacy Technician

## 2012-08-25 ENCOUNTER — Encounter (HOSPITAL_COMMUNITY): Payer: Self-pay

## 2012-08-25 ENCOUNTER — Encounter (HOSPITAL_COMMUNITY)
Admission: RE | Admit: 2012-08-25 | Discharge: 2012-08-25 | Disposition: A | Discharge status: Home / Self Care | Payer: Medicare PPO | Source: Ambulatory Visit | Attending: Vascular Surgery | Admitting: Vascular Surgery

## 2012-08-25 ENCOUNTER — Ambulatory Visit (HOSPITAL_COMMUNITY)
Admission: RE | Admit: 2012-08-25 | Discharge: 2012-08-25 | Disposition: A | Discharge status: Home / Self Care | Payer: Medicare PPO | Source: Ambulatory Visit | Attending: Anesthesiology | Admitting: Anesthesiology

## 2012-08-25 DIAGNOSIS — Z01818 Encounter for other preprocedural examination: Secondary | ICD-10-CM | POA: Insufficient documentation

## 2012-08-25 HISTORY — DX: Acute myocardial infarction, unspecified: I21.9

## 2012-08-25 HISTORY — DX: Dizziness and giddiness: R42

## 2012-08-25 HISTORY — DX: Abnormal electrocardiogram (ECG) (EKG): R94.31

## 2012-08-25 HISTORY — DX: Shortness of breath: R06.02

## 2012-08-25 HISTORY — DX: Syncope and collapse: R55

## 2012-08-25 HISTORY — DX: Headache: R51

## 2012-08-25 HISTORY — DX: Pneumonia, unspecified organism: J18.9

## 2012-08-25 LAB — CBC
MCH: 31.3 pg (ref 26.0–34.0)
MCV: 92.6 fL (ref 78.0–100.0)
Platelets: 180 10*3/uL (ref 150–400)
RBC: 4.32 MIL/uL (ref 3.87–5.11)
RDW: 12.2 % (ref 11.5–15.5)
WBC: 6.4 10*3/uL (ref 4.0–10.5)

## 2012-08-25 LAB — SURGICAL PCR SCREEN
MRSA, PCR: NEGATIVE
Staphylococcus aureus: POSITIVE — AB

## 2012-08-25 LAB — PROTIME-INR
INR: 1.06 (ref 0.00–1.49)
Prothrombin Time: 13.7 seconds (ref 11.6–15.2)

## 2012-08-25 LAB — COMPREHENSIVE METABOLIC PANEL
ALT: 23 U/L (ref 0–35)
AST: 22 U/L (ref 0–37)
Albumin: 3.7 g/dL (ref 3.5–5.2)
Calcium: 9.9 mg/dL (ref 8.4–10.5)
Chloride: 100 mEq/L (ref 96–112)
Creatinine, Ser: 1.3 mg/dL — ABNORMAL HIGH (ref 0.50–1.10)
Sodium: 140 mEq/L (ref 135–145)

## 2012-08-25 LAB — ABO/RH: ABO/RH(D): O POS

## 2012-08-25 LAB — TYPE AND SCREEN: Antibody Screen: NEGATIVE

## 2012-08-25 NOTE — Pre-Procedure Instructions (Signed)
20 Theresa Huang  08/25/2012   Your procedure is scheduled on:  Tuesday, December 17th  Report to [facility] at 0530 AM.  Call this number if you have problems the morning of surgery: [phone]   Remember:   Do not eat food or drink:After Midnight.   Take these medicines the morning of surgery with A SIP OF WATER: tylenol if needed, all inhalers, advair, spiriva, synthroid, zoloft   Do not wear jewelry, make-up or nail polish.  Do not wear lotions, powders, or perfumes.   Do not shave 48 hours prior to surgery.   Do not bring valuables to the hospital.  Contacts, dentures or bridgework may not be worn into surgery.  Leave suitcase in the car. After surgery it may be brought to your room.  For patients admitted to the hospital, checkout time is 11:00 AM the day of discharge.   Patients discharged the day of surgery will not be allowed to drive home.    Special Instructions: Shower using CHG 2 nights before surgery and the night before surgery.  If you shower the day of surgery use CHG.  Use special wash - you have one bottle of CHG for all showers.  You should use approximately 1/3 of the bottle for each shower.   Please read over the following fact sheets that you were given: Pain Booklet, Coughing and Deep Breathing, Blood Transfusion Information, MRSA Information and Surgical Site Infection Prevention

## 2012-08-25 NOTE — Progress Notes (Signed)
Primary Physician - Dr. Timothy Lasso Cardiologist - Dr. Jens Som Cardiac clearance in epic Stress test, ekg, 2013 in epic  pt has event monitor in place will be taking off tomorrow.

## 2012-08-25 NOTE — Consult Note (Addendum)
Anesthesia chart review: Patient is a 78 year old female scheduled for right carotid endarterectomy by Dr. Imogene Burn on 09/01/2012. History includes former smoker, diastolic dysfunction, myocardial infarction in 2006, COPD on home oxygen, diabetes mellitus type 2, headaches, DVT, hyperlipidemia, coma, hearing loss, hypothyroidism. She was recently evaluated for dizziness and syncope. Carotid Dopplers in October 2013 showed 70-99% right internal carotid artery stenosis and less than 50% left internal carotid artery stenosis.  She was referred to Cardiologist Dr. Olga Millers.  A nuclear stress test and event monitor were ordered (see below).  She is scheduled to remove her event monitor on 08/26/12.  PCP is Dr. Nani Gasser.  EKG on 07/09/12 showed NSR.  Nuclear stress test on 08/10/2012 was normal, LV EF 79%, normal LV wall motion, low risk scan.   Echo on 11/05/10 [facility] Cardiology) showed normal LV size and thickness, LV EF 60-65%, normal LV wall motion, mitral inflow suggests impaired left ventricular diastolic relaxation, the left atrium is mild dilated, RV systolic pressure is elevated between 30-40 mm Hg consistent with mild pulmonary hypertension.  Chest x-ray on 08/25/2012 showed no active disease.  Preoperative labs noted.  She needs a UA on the day of surgery.    I'll follow-up later to see if event monitor results are available.  Shonna Chock, PA-C 08/25/12 1437  Addendum: 08/28/12 1615 By Adolph Pollack Cardiology nursing notes, Dr. Jens Som has reviewed patient's recent event monitor which showed sinus rhythm with brief PAT and occasional PVCs.  (I do not yet see the formal report.)  Anticipate she can proceed as planned in no significant changes in her status.

## 2012-08-27 ENCOUNTER — Telehealth: Payer: Self-pay | Admitting: *Deleted

## 2012-08-27 NOTE — Telephone Encounter (Signed)
Ecardio event monitor EOS report rec'd by D. Mathis 08/27/12. TK 

## 2012-08-28 ENCOUNTER — Telehealth: Payer: Self-pay | Admitting: *Deleted

## 2012-08-28 NOTE — Telephone Encounter (Signed)
Left message for pt to call, monitor reviewed by dr Jens Som shows sinus with brief PAT and occ PVC.

## 2012-08-31 MED ORDER — SODIUM CHLORIDE 0.9 % IV SOLN: INTRAVENOUS | Status: DC

## 2012-08-31 MED ORDER — DEXTROSE 5 % IV SOLN
1.5000 g | INTRAVENOUS | Status: AC
  Administered 2012-09-01: 1.5 g via INTRAVENOUS
  Filled 2012-08-31: qty 1.5

## 2012-09-01 ENCOUNTER — Encounter (HOSPITAL_COMMUNITY): Payer: Self-pay | Admitting: Vascular Surgery

## 2012-09-01 ENCOUNTER — Encounter (HOSPITAL_COMMUNITY)
Admission: RE | Disposition: A | Discharge status: Home / Self Care | Payer: Self-pay | Source: Ambulatory Visit | Attending: Vascular Surgery

## 2012-09-01 ENCOUNTER — Inpatient Hospital Stay (HOSPITAL_COMMUNITY)
Admission: RE | Admit: 2012-09-01 | Discharge: 2012-09-02 | DRG: 039 | Disposition: A | Discharge status: Home / Self Care | Payer: Medicare PPO | Source: Ambulatory Visit | Attending: Vascular Surgery | Admitting: Vascular Surgery

## 2012-09-01 ENCOUNTER — Encounter (HOSPITAL_COMMUNITY): Payer: Self-pay | Admitting: Surgery

## 2012-09-01 ENCOUNTER — Inpatient Hospital Stay (HOSPITAL_COMMUNITY): Payer: Medicare PPO | Admitting: Vascular Surgery

## 2012-09-01 DIAGNOSIS — I6529 Occlusion and stenosis of unspecified carotid artery: Secondary | ICD-10-CM

## 2012-09-01 DIAGNOSIS — E039 Hypothyroidism, unspecified: Secondary | ICD-10-CM | POA: Diagnosis present

## 2012-09-01 DIAGNOSIS — H409 Unspecified glaucoma: Secondary | ICD-10-CM | POA: Diagnosis present

## 2012-09-01 DIAGNOSIS — E119 Type 2 diabetes mellitus without complications: Secondary | ICD-10-CM | POA: Diagnosis present

## 2012-09-01 DIAGNOSIS — Z86718 Personal history of other venous thrombosis and embolism: Secondary | ICD-10-CM

## 2012-09-01 DIAGNOSIS — E785 Hyperlipidemia, unspecified: Secondary | ICD-10-CM | POA: Diagnosis present

## 2012-09-01 DIAGNOSIS — I6523 Occlusion and stenosis of bilateral carotid arteries: Secondary | ICD-10-CM

## 2012-09-01 HISTORY — PX: PATCH ANGIOPLASTY: SHX6230

## 2012-09-01 HISTORY — PX: ENDARTERECTOMY: SHX5162

## 2012-09-01 LAB — URINALYSIS, ROUTINE W REFLEX MICROSCOPIC
Bilirubin Urine: NEGATIVE
Ketones, ur: NEGATIVE mg/dL
Nitrite: NEGATIVE
Protein, ur: NEGATIVE mg/dL
Urobilinogen, UA: 0.2 mg/dL (ref 0.0–1.0)

## 2012-09-01 LAB — GLUCOSE, CAPILLARY
Glucose-Capillary: 132 mg/dL — ABNORMAL HIGH (ref 70–99)
Glucose-Capillary: 169 mg/dL — ABNORMAL HIGH (ref 70–99)

## 2012-09-01 LAB — URINE MICROSCOPIC-ADD ON

## 2012-09-01 SURGERY — ENDARTERECTOMY, CAROTID
Anesthesia: General | Site: Neck | Laterality: Right | Wound class: Clean

## 2012-09-01 MED ORDER — EPHEDRINE SULFATE 50 MG/ML IJ SOLN
INTRAMUSCULAR | Status: DC | PRN
  Administered 2012-09-01: 5 mg via INTRAVENOUS

## 2012-09-01 MED ORDER — DOPAMINE-DEXTROSE 3.2-5 MG/ML-% IV SOLN: 3.0000 ug/kg/min | INTRAVENOUS | Status: DC

## 2012-09-01 MED ORDER — NITROGLYCERIN 0.4 MG SL SUBL: 0.4000 mg | SUBLINGUAL_TABLET | SUBLINGUAL | Status: DC | PRN

## 2012-09-01 MED ORDER — METOCLOPRAMIDE HCL 5 MG/ML IJ SOLN: 10.0000 mg | Freq: Once | INTRAMUSCULAR | Status: DC | PRN

## 2012-09-01 MED ORDER — ARTIFICIAL TEARS OP OINT
TOPICAL_OINTMENT | OPHTHALMIC | Status: DC | PRN
  Administered 2012-09-01: 1 via OPHTHALMIC

## 2012-09-01 MED ORDER — SERTRALINE HCL 100 MG PO TABS
100.0000 mg | ORAL_TABLET | Freq: Every day | ORAL | Status: DC
  Administered 2012-09-01 – 2012-09-02 (×2): 100 mg via ORAL
  Filled 2012-09-01 (×2): qty 1

## 2012-09-01 MED ORDER — 0.9 % SODIUM CHLORIDE (POUR BTL) OPTIME
TOPICAL | Status: DC | PRN
  Administered 2012-09-01: 2000 mL

## 2012-09-01 MED ORDER — PROPOFOL 10 MG/ML IV BOLUS
INTRAVENOUS | Status: DC | PRN
  Administered 2012-09-01: 100 mg via INTRAVENOUS
  Administered 2012-09-01: 50 mg via INTRAVENOUS

## 2012-09-01 MED ORDER — FENTANYL CITRATE 0.05 MG/ML IJ SOLN
INTRAMUSCULAR | Status: DC | PRN
  Administered 2012-09-01 (×2): 50 ug via INTRAVENOUS

## 2012-09-01 MED ORDER — PHENOL 1.4 % MT LIQD: 1.0000 | OROMUCOSAL | Status: DC | PRN

## 2012-09-01 MED ORDER — THROMBIN 20000 UNITS EX SOLR
CUTANEOUS | Status: AC
  Filled 2012-09-01: qty 20000

## 2012-09-01 MED ORDER — LACTATED RINGERS IV SOLN
INTRAVENOUS | Status: DC | PRN
  Administered 2012-09-01 (×2): via INTRAVENOUS

## 2012-09-01 MED ORDER — LABETALOL HCL 5 MG/ML IV SOLN
INTRAVENOUS | Status: AC
  Filled 2012-09-01: qty 4

## 2012-09-01 MED ORDER — ALBUTEROL SULFATE (5 MG/ML) 0.5% IN NEBU
2.5000 mg | INHALATION_SOLUTION | RESPIRATORY_TRACT | Status: DC
  Administered 2012-09-01 (×2): 2.5 mg via RESPIRATORY_TRACT
  Filled 2012-09-01 (×2): qty 0.5

## 2012-09-01 MED ORDER — DEXTRAN 40 IN SALINE 10-0.9 % IV SOLN
INTRAVENOUS | Status: DC | PRN
  Administered 2012-09-01: 500 mL

## 2012-09-01 MED ORDER — PROTAMINE SULFATE 10 MG/ML IV SOLN
INTRAVENOUS | Status: DC | PRN
  Administered 2012-09-01: 10 mg via INTRAVENOUS
  Administered 2012-09-01: 20 mg via INTRAVENOUS

## 2012-09-01 MED ORDER — ACETAMINOPHEN 325 MG PO TABS
325.0000 mg | ORAL_TABLET | ORAL | Status: DC | PRN
  Administered 2012-09-01: 650 mg via ORAL
  Filled 2012-09-01: qty 2

## 2012-09-01 MED ORDER — ONDANSETRON HCL 4 MG/2ML IJ SOLN: 4.0000 mg | Freq: Four times a day (QID) | INTRAMUSCULAR | Status: DC | PRN

## 2012-09-01 MED ORDER — BISACODYL 10 MG RE SUPP: 10.0000 mg | Freq: Every day | RECTAL | Status: DC | PRN

## 2012-09-01 MED ORDER — OXYCODONE HCL 5 MG PO TABS: 5.0000 mg | ORAL_TABLET | Freq: Once | ORAL | Status: DC | PRN

## 2012-09-01 MED ORDER — HEPARIN SODIUM (PORCINE) 1000 UNIT/ML IJ SOLN
INTRAMUSCULAR | Status: DC | PRN
  Administered 2012-09-01: 6000 [IU] via INTRAVENOUS

## 2012-09-01 MED ORDER — SIMVASTATIN 40 MG PO TABS
40.0000 mg | ORAL_TABLET | Freq: Every evening | ORAL | Status: DC
  Administered 2012-09-01: 40 mg via ORAL
  Filled 2012-09-01 (×2): qty 1

## 2012-09-01 MED ORDER — THROMBIN 20000 UNITS EX SOLR
CUTANEOUS | Status: DC | PRN
  Administered 2012-09-01: 10:00:00 via TOPICAL

## 2012-09-01 MED ORDER — ASPIRIN EC 325 MG PO TBEC
325.0000 mg | DELAYED_RELEASE_TABLET | Freq: Every day | ORAL | Status: DC
  Administered 2012-09-02: 325 mg via ORAL
  Filled 2012-09-01: qty 1

## 2012-09-01 MED ORDER — ROCURONIUM BROMIDE 100 MG/10ML IV SOLN
INTRAVENOUS | Status: DC | PRN
  Administered 2012-09-01: 40 mg via INTRAVENOUS
  Administered 2012-09-01: 10 mg via INTRAVENOUS

## 2012-09-01 MED ORDER — FENTANYL CITRATE 0.05 MG/ML IJ SOLN: 25.0000 ug | INTRAMUSCULAR | Status: DC | PRN

## 2012-09-01 MED ORDER — DEXTROSE 5 % IV SOLN
1.5000 g | Freq: Two times a day (BID) | INTRAVENOUS | Status: AC
  Administered 2012-09-01 – 2012-09-02 (×2): 1.5 g via INTRAVENOUS
  Filled 2012-09-01 (×3): qty 1.5

## 2012-09-01 MED ORDER — OXYCODONE HCL 5 MG PO TABS
5.0000 mg | ORAL_TABLET | ORAL | Status: DC | PRN
  Administered 2012-09-01: 10 mg via ORAL
  Administered 2012-09-02: 5 mg via ORAL
  Administered 2012-09-02: 10 mg via ORAL
  Filled 2012-09-01: qty 1
  Filled 2012-09-01 (×2): qty 2

## 2012-09-01 MED ORDER — LEVOTHYROXINE SODIUM 50 MCG PO TABS
50.0000 ug | ORAL_TABLET | Freq: Every day | ORAL | Status: DC
  Administered 2012-09-02: 50 ug via ORAL
  Filled 2012-09-01 (×2): qty 1

## 2012-09-01 MED ORDER — ONDANSETRON HCL 4 MG/2ML IJ SOLN
INTRAMUSCULAR | Status: DC | PRN
  Administered 2012-09-01: 4 mg via INTRAVENOUS

## 2012-09-01 MED ORDER — POTASSIUM CHLORIDE CRYS ER 20 MEQ PO TBCR: 20.0000 meq | EXTENDED_RELEASE_TABLET | Freq: Once | ORAL | Status: AC | PRN

## 2012-09-01 MED ORDER — MAGNESIUM SULFATE 40 MG/ML IJ SOLN
2.0000 g | Freq: Once | INTRAMUSCULAR | Status: AC | PRN
  Filled 2012-09-01: qty 50

## 2012-09-01 MED ORDER — SODIUM CHLORIDE 0.9 % IR SOLN
Status: DC | PRN
  Administered 2012-09-01: 08:00:00

## 2012-09-01 MED ORDER — SODIUM CHLORIDE 0.9 % IV SOLN: 500.0000 mL | Freq: Once | INTRAVENOUS | Status: AC | PRN

## 2012-09-01 MED ORDER — IPRATROPIUM BROMIDE HFA 17 MCG/ACT IN AERS
2.0000 | INHALATION_SPRAY | Freq: Four times a day (QID) | RESPIRATORY_TRACT | Status: DC
Start: 2012-09-01 — End: 2012-09-01

## 2012-09-01 MED ORDER — LABETALOL HCL 5 MG/ML IV SOLN
10.0000 mg | INTRAVENOUS | Status: DC | PRN
  Administered 2012-09-01: 10 mg via INTRAVENOUS

## 2012-09-01 MED ORDER — DOCUSATE SODIUM 100 MG PO CAPS
100.0000 mg | ORAL_CAPSULE | Freq: Every day | ORAL | Status: DC
  Administered 2012-09-02: 100 mg via ORAL
  Filled 2012-09-01: qty 1

## 2012-09-01 MED ORDER — PANTOPRAZOLE SODIUM 40 MG PO TBEC
40.0000 mg | DELAYED_RELEASE_TABLET | Freq: Every day | ORAL | Status: DC
  Administered 2012-09-01 – 2012-09-02 (×2): 40 mg via ORAL
  Filled 2012-09-01 (×2): qty 1

## 2012-09-01 MED ORDER — ASPIRIN 81 MG PO TABS: 81.0000 mg | ORAL_TABLET | Freq: Every day | ORAL | Status: DC

## 2012-09-01 MED ORDER — ALBUTEROL SULFATE (5 MG/ML) 0.5% IN NEBU: 2.5000 mg | INHALATION_SOLUTION | RESPIRATORY_TRACT | Status: DC | PRN

## 2012-09-01 MED ORDER — INSULIN ASPART 100 UNIT/ML ~~LOC~~ SOLN
0.0000 [IU] | SUBCUTANEOUS | Status: DC
  Administered 2012-09-01: 3 [IU] via SUBCUTANEOUS
  Administered 2012-09-02: 2 [IU] via SUBCUTANEOUS
  Administered 2012-09-02: 3 [IU] via SUBCUTANEOUS

## 2012-09-01 MED ORDER — LIDOCAINE HCL (CARDIAC) 20 MG/ML IV SOLN
INTRAVENOUS | Status: DC | PRN
  Administered 2012-09-01: 100 mg via INTRAVENOUS

## 2012-09-01 MED ORDER — HYDRALAZINE HCL 20 MG/ML IJ SOLN
INTRAMUSCULAR | Status: AC
  Filled 2012-09-01: qty 1

## 2012-09-01 MED ORDER — METOPROLOL TARTRATE 1 MG/ML IV SOLN: 2.0000 mg | INTRAVENOUS | Status: DC | PRN

## 2012-09-01 MED ORDER — ALBUTEROL SULFATE HFA 108 (90 BASE) MCG/ACT IN AERS: 2.0000 | INHALATION_SPRAY | RESPIRATORY_TRACT | Status: DC | PRN

## 2012-09-01 MED ORDER — NITROGLYCERIN IN D5W 200-5 MCG/ML-% IV SOLN
INTRAVENOUS | Status: DC | PRN
  Administered 2012-09-01: 40 ug/min via INTRAVENOUS

## 2012-09-01 MED ORDER — GUAIFENESIN-DM 100-10 MG/5ML PO SYRP: 15.0000 mL | ORAL_SOLUTION | ORAL | Status: DC | PRN

## 2012-09-01 MED ORDER — PHENYLEPHRINE HCL 10 MG/ML IJ SOLN
10.0000 mg | INTRAVENOUS | Status: DC | PRN
  Administered 2012-09-01: 15 ug/min via INTRAVENOUS

## 2012-09-01 MED ORDER — OXYCODONE HCL 5 MG/5ML PO SOLN: 5.0000 mg | Freq: Once | ORAL | Status: DC | PRN

## 2012-09-01 MED ORDER — HYDRALAZINE HCL 20 MG/ML IJ SOLN
10.0000 mg | INTRAMUSCULAR | Status: DC | PRN
  Administered 2012-09-01: 10 mg via INTRAVENOUS

## 2012-09-01 MED ORDER — MOMETASONE FURO-FORMOTEROL FUM 100-5 MCG/ACT IN AERO
2.0000 | INHALATION_SPRAY | Freq: Two times a day (BID) | RESPIRATORY_TRACT | Status: DC
  Administered 2012-09-01 – 2012-09-02 (×3): 2 via RESPIRATORY_TRACT
  Filled 2012-09-01: qty 8.8

## 2012-09-01 MED ORDER — SENNOSIDES-DOCUSATE SODIUM 8.6-50 MG PO TABS
1.0000 | ORAL_TABLET | Freq: Every evening | ORAL | Status: DC | PRN
  Filled 2012-09-01: qty 1

## 2012-09-01 MED ORDER — DEXTRAN 40 IN SALINE 10-0.9 % IV SOLN
INTRAVENOUS | Status: AC
  Filled 2012-09-01: qty 500

## 2012-09-01 MED ORDER — GLIPIZIDE ER 10 MG PO TB24
10.0000 mg | ORAL_TABLET | Freq: Every day | ORAL | Status: DC
  Administered 2012-09-02: 10 mg via ORAL
  Filled 2012-09-01 (×2): qty 1

## 2012-09-01 MED ORDER — MORPHINE SULFATE 2 MG/ML IJ SOLN: 2.0000 mg | INTRAMUSCULAR | Status: DC | PRN

## 2012-09-01 MED ORDER — ACETAMINOPHEN 650 MG RE SUPP: 325.0000 mg | RECTAL | Status: DC | PRN

## 2012-09-01 MED ORDER — LIDOCAINE HCL (PF) 1 % IJ SOLN
INTRAMUSCULAR | Status: AC
  Filled 2012-09-01: qty 30

## 2012-09-01 MED ORDER — SODIUM CHLORIDE 0.9 % IV SOLN: INTRAVENOUS | Status: DC

## 2012-09-01 MED ORDER — TIOTROPIUM BROMIDE MONOHYDRATE 18 MCG IN CAPS
18.0000 ug | ORAL_CAPSULE | Freq: Every day | RESPIRATORY_TRACT | Status: DC
  Administered 2012-09-01 – 2012-09-02 (×2): 18 ug via RESPIRATORY_TRACT
  Filled 2012-09-01: qty 5

## 2012-09-01 MED ORDER — LIDOCAINE HCL 4 % MT SOLN
OROMUCOSAL | Status: DC | PRN
  Administered 2012-09-01: 4 mL via TOPICAL

## 2012-09-01 SURGICAL SUPPLY — 57 items
ADH SKN CLS APL DERMABOND .7 (GAUZE/BANDAGES/DRESSINGS) ×1
BAG DECANTER FOR FLEXI CONT (MISCELLANEOUS) ×2 IMPLANT
CANISTER SUCTION 2500CC (MISCELLANEOUS) ×2 IMPLANT
CATH ROBINSON RED A/P 18FR (CATHETERS) ×2 IMPLANT
CATH SUCT 10FR WHISTLE TIP (CATHETERS) ×1 IMPLANT
CLIP TI MEDIUM 24 (CLIP) ×2 IMPLANT
CLIP TI WIDE RED SMALL 24 (CLIP) ×2 IMPLANT
CLOTH BEACON ORANGE TIMEOUT ST (SAFETY) ×2 IMPLANT
COVER PROBE W GEL 5X96 (DRAPES) IMPLANT
COVER SURGICAL LIGHT HANDLE (MISCELLANEOUS) ×2 IMPLANT
CRADLE DONUT ADULT HEAD (MISCELLANEOUS) ×2 IMPLANT
DERMABOND ADVANCED (GAUZE/BANDAGES/DRESSINGS) ×1
DERMABOND ADVANCED .7 DNX12 (GAUZE/BANDAGES/DRESSINGS) ×1 IMPLANT
DRAPE WARM FLUID 44X44 (DRAPE) ×2 IMPLANT
ELECT REM PT RETURN 9FT ADLT (ELECTROSURGICAL) ×2
ELECTRODE REM PT RTRN 9FT ADLT (ELECTROSURGICAL) ×1 IMPLANT
GEL ULTRASOUND 20GR AQUASONIC (MISCELLANEOUS) ×1 IMPLANT
GLOVE BIO SURGEON STRL SZ 6.5 (GLOVE) ×1 IMPLANT
GLOVE BIO SURGEON STRL SZ7 (GLOVE) ×2 IMPLANT
GLOVE BIOGEL PI IND STRL 6.5 (GLOVE) IMPLANT
GLOVE BIOGEL PI IND STRL 7.0 (GLOVE) IMPLANT
GLOVE BIOGEL PI IND STRL 7.5 (GLOVE) ×1 IMPLANT
GLOVE BIOGEL PI INDICATOR 6.5 (GLOVE) ×2
GLOVE BIOGEL PI INDICATOR 7.0 (GLOVE) ×3
GLOVE BIOGEL PI INDICATOR 7.5 (GLOVE) ×2
GLOVE ECLIPSE 7.0 STRL STRAW (GLOVE) ×1 IMPLANT
GLOVE SS BIOGEL STRL SZ 7 (GLOVE) IMPLANT
GLOVE SUPERSENSE BIOGEL SZ 7 (GLOVE) ×1
GLOVE SURG SS PI 6.5 STRL IVOR (GLOVE) ×2 IMPLANT
GOWN STRL NON-REIN LRG LVL3 (GOWN DISPOSABLE) ×7 IMPLANT
HEMOSTAT SURGICEL 2X14 (HEMOSTASIS) IMPLANT
KIT BASIN OR (CUSTOM PROCEDURE TRAY) ×2 IMPLANT
KIT ROOM TURNOVER OR (KITS) ×2 IMPLANT
NS IRRIG 1000ML POUR BTL (IV SOLUTION) ×4 IMPLANT
PACK CAROTID (CUSTOM PROCEDURE TRAY) ×2 IMPLANT
PAD ARMBOARD 7.5X6 YLW CONV (MISCELLANEOUS) ×4 IMPLANT
PATCH VASCULAR VASCU GUARD 1X6 (Vascular Products) ×2 IMPLANT
SET COLLECT BLD 21X3/4 12 PB (MISCELLANEOUS) ×1 IMPLANT
SHUNT CAROTID BYPASS 10 (VASCULAR PRODUCTS) IMPLANT
SHUNT CAROTID BYPASS 12FRX15.5 (VASCULAR PRODUCTS) IMPLANT
SPECIMEN JAR SMALL (MISCELLANEOUS) ×2 IMPLANT
SPONGE SURGIFOAM ABS GEL 100 (HEMOSTASIS) ×1 IMPLANT
STOPCOCK 4 WAY LG BORE MALE ST (IV SETS) ×1 IMPLANT
SUT ETHILON 3 0 PS 1 (SUTURE) IMPLANT
SUT MNCRL AB 4-0 PS2 18 (SUTURE) ×2 IMPLANT
SUT PROLENE 6 0 BV (SUTURE) ×4 IMPLANT
SUT PROLENE 7 0 BV 1 (SUTURE) IMPLANT
SUT VIC AB 3-0 SH 27 (SUTURE) ×2
SUT VIC AB 3-0 SH 27X BRD (SUTURE) ×1 IMPLANT
SYR TB 1ML LUER SLIP (SYRINGE) IMPLANT
SYSTEM CHEST DRAIN TLS 7FR (DRAIN) ×1 IMPLANT
TOWEL OR 17X24 6PK STRL BLUE (TOWEL DISPOSABLE) ×3 IMPLANT
TOWEL OR 17X26 10 PK STRL BLUE (TOWEL DISPOSABLE) ×2 IMPLANT
TRAY FOLEY CATH 14FRSI W/METER (CATHETERS) ×2 IMPLANT
TUBING ART PRESS 48 MALE/FEM (TUBING) ×1 IMPLANT
TUBING EXTENTION W/L.L. (IV SETS) ×1 IMPLANT
WATER STERILE IRR 1000ML POUR (IV SOLUTION) ×2 IMPLANT

## 2012-09-01 NOTE — Transfer of Care (Signed)
Immediate Anesthesia Transfer of Care Note  Patient: SAMIYYAH MOFFA  Procedure(s) Performed: Procedure(s) (LRB) with comments: ENDARTERECTOMY CAROTID (Right) PATCH ANGIOPLASTY (Right) - Vascu-Guard Patch Angioplasty  Patient Location: PACU  Anesthesia Type:General  Level of Consciousness: awake, alert , oriented and patient cooperative  Airway & Oxygen Therapy: Patient Spontanous Breathing and Patient connected to nasal cannula oxygen  Post-op Assessment: Report given to PACU RN, Post -op Vital signs reviewed and stable, Patient moving all extremities and Patient able to stick tongue midline  Post vital signs: Reviewed and stable  Complications: No apparent anesthesia complications

## 2012-09-01 NOTE — Anesthesia Procedure Notes (Signed)
Procedure Name: Intubation Date/Time: 09/01/2012 7:44 AM Performed by: Jerilee Hoh Pre-anesthesia Checklist: Patient identified, Emergency Drugs available, Suction available and Patient being monitored Patient Re-evaluated:Patient Re-evaluated prior to inductionOxygen Delivery Method: Circle system utilized Preoxygenation: Pre-oxygenation with 100% oxygen Intubation Type: IV induction Ventilation: Mask ventilation without difficulty and Oral airway inserted - appropriate to patient size Laryngoscope Size: Mac and 3 Grade View: Grade I Tube type: Oral Tube size: 7.0 mm Number of attempts: 1 Airway Equipment and Method: Stylet and LTA kit utilized Placement Confirmation: ETT inserted through vocal cords under direct vision,  positive ETCO2 and breath sounds checked- equal and bilateral Secured at: 21 cm Tube secured with: Tape Dental Injury: Teeth and Oropharynx as per pre-operative assessment

## 2012-09-01 NOTE — Interval H&P Note (Signed)
[facility] [place]  History and Physical Update  The patient was interviewed and re-examined.  The patient's previous History and Physical has been reviewed and is unchanged.  There is no change in the plan of care: R CEA.  I discussed with the patient the risks, benefits, and alternatives to carotid endarterectomy.  I discussed the procedural details of carotid endarterectomy with the patient.  The patient is aware that the risks of carotid endarterectomy include but are not limited to: bleeding, infection, stroke, myocardial infarction, death, cranial nerve injuries both temporary and permanent, neck hematoma, possible airway compromise, labile blood pressure post-operatively, cerebral hyperperfusion syndrome, and possible need for additional interventions in the future. The patient is aware of the risks and agrees to proceed forward with the procedure.  Leonides Sake, MD [facility] [place] Office: [phone] Pager: [phone]  09/01/2012, 7:27 AM

## 2012-09-01 NOTE — Progress Notes (Signed)
Utilization review completed.  

## 2012-09-01 NOTE — Op Note (Signed)
OPERATIVE NOTE  PROCEDURE:   1.  right carotid endarterectomy with bovine patch angioplasty  PRE-OPERATIVE DIAGNOSIS: right asymptomatic carotid stenosis > 80%  POST-OPERATIVE DIAGNOSIS: same as above   SURGEON: Leonides Sake, MD  ASSISTANT(S): Lianne Cure, PAC   ANESTHESIA: general  ESTIMATED BLOOD LOSS: 100 cc  FINDING(S): 1.  Appropriate internal and external carotid flow signature 2.  Abnormal monophasic signal in common carotid artery 3.  Anterior-posterior orientation of carotid arteries (external anterior, internal posterior) 4.  Heavily calcified bifurcation and internal carotid artery plaque (near occluded)  SPECIMEN(S):  Carotid plaque (sent to Pathology)  INDICATIONS:   Theresa Huang is a 78 y.o. female who presents with right asymptomatic carotid stenosis >80%.  I discussed with the patient the risks, benefits, and alternatives to carotid endarterectomy.  I discussed the procedural details of carotid endarterectomy with the patient.  The patient is aware that the risks of carotid endarterectomy include but are not limited to: bleeding, infection, stroke, myocardial infarction, death, cranial nerve injuries both temporary and permanent, neck hematoma, possible airway compromise, labile blood pressure post-operatively, cerebral hyperperfusion syndrome, and possible need for additional interventions in the future. The patient is aware of the risks and agrees to proceed forward with the procedure.  DESCRIPTION: After full informed written consent was obtained from the patient, the patient was brought back to the operating room and placed supine upon the operating table.  Prior to induction, the patient received IV antibiotics.  After obtaining adequate anesthesia, the patient was placed into semi-Fowler position with a shoulder roll in place and the patient's neck slightly hyperextended and rotated away from the surgical site.  The patient was prepped in the standard fashion  for a right carotid endarterectomy.  I made an incision anterior to the sternocleidomastoid muscle and dissected down through the subcutaneous tissue.  The platysmas was opened with electrocautery.  Then I dissected down to the internal jugular vein.  This was dissected posteriorly until I obtained visualization of the common carotid artery.  This was dissected out and then an umbilical tape was placed around the common carotid artery and I loosely applied a Rumel tourniquet.  I then dissected in a periadventitial fashion along the common carotid artery up to the bifurcation.  I then identified the external carotid artery and the superior thyroid artery.  A 2-0 silk tie was looped around the superior thyroid artery, and I also dissected out the external carotid artery and placed a vessel loop around it.  In continuing the dissection to the internal carotid artery, I identified the facial vein.  This was ligated and then transected, giving me improved exposure of the internal carotid artery.  In the process of this dissection, the hypoglossal nerve was identified.  I then dissected out the internal carotid artery until I identified an area of soft tissue in the internal carotid artery.  Due to the steep anterior-posterior orientation of the bifurcation and internal and external carotid arteries, I did not think a Rumel tourniquet could be applied to the internal carotid artery.   I dissected slightly distal to relatively disease free portion of the internal carotid artery and placed a vessel loop around the artery.  At this point, we gave the patient a therapeutic bolus of Heparin intravenously (roughly 80 units/kg).  After waiting 3 minutes, then I clamped the external carotid artery and then the common carotid artery.  Using a butterfly needle connected to the arterial pressure circuit, I cannulated the common carotid artery distal  to the clamp.  The stump mean arterial pressure was measured at: 70 mm Hg.  Based on  this measurement, I felt no shunt was needed.  I then made an arteriotomy in the common carotid artery with a 11 blade, and extended the arteriotomy with a Potts scissor down into the common carotid artery.  I then tried to extend the arteriotomy into the internal carotid artery with the Potts scissor, but the proximal internal carotid artery was nearly occluded.  I had make an incision over the calcified plaque until I reached an area that was not diseased.  I did a limited endarterectomy and was then able extend the arteriotomy into the disease free segment of the artery.  At this point, I started the endarterectomy in the common carotid artery with a Cytogeneticist and carried this dissection down into the common carotid artery circumferentially.  Then I transected the plaque at a segment where it was adherent.  I then carried this dissection up into the external carotid artery.  The plaque was extracted by unclamping the external carotid artery and everting the artery.  The dissection was then carried into the internal carotid artery, extracting the remaining portion of the carotid plaque.  I passed the plaque off the field as a specimen.  I then spent the next 30 minutes removing intimal flaps and loose debris.  Eventually I reached the point where the residual plaque was densely adherent and any further dissection would compromise the integrity of the wall.  After verifying that there was no more loose intimal flaps or debris, I re-interrogated the entirety of this carotid artery.  At this point, I was satisfied that the minimal remaining disease was densely adherent to the wall and wall integrity was intact.  At this point, I then fashioned a bovine pericardial patch for the geometry of this artery and sewed it in place with two running stitch of 6-0 Prolene, one from each end.  Prior to completing this patch angioplasty, I backbled the external carotid, then the common carotid artery, and then the  internal carotid artery.   Then I instilled heparinized saline in this patched artery and then completed the patch angioplasty in the usual fashion.  First, I released the clamp on the external carotid artery, then I released it on the common carotid artery.  After waiting a few seconds, I then released it on the internal carotid artery.  I then interrogated this patient's arteries with the continuous Doppler.  The audible waveforms in the internal and external carotid arteries were consistent with the expected characteristics for each artery.  The common carotid artery unexpectedly had only a monophasic signal despite having a strong pulse in the artery.  I elected to not do an intraoperative ultrasound as the orientation of the carotid arteries was no compatible with the available probes.  At this point, I washed out the wound, and placed thrombin and Gelfoam throughout.  I also gave the patient 30 mg of protamine to reverse his anticoagulation.   After waiting a few minutes, I removed the thrombin and Gelfoam and washed out the wound.  There was no more active bleeding in the surgical site.   I then reapproximated the platysma muscle with a running stitch of 3-0 Vicryl.  The skin was then reapproximated with a running subcuticular 4-0 Monocryl stitch.  The skin was then cleaned, dried and Dermabond was used to reinforce the skin closure.  The patient woke without any problems, neurologically intact.  COMPLICATIONS: none  CONDITION: stable  Leonides Sake, MD [facility] [place] Office: [phone] Pager: [phone]  09/01/2012, 9:59 AM

## 2012-09-01 NOTE — Progress Notes (Signed)
Pharmacy Consult to Adjust Antibiotics for Renal Function  Patient ordered Zinacef 1.5 g IV q12h for 2 doses post-op CrCl ~34 ml/min  Dosing is appropriate for renal function. Pharmacy signing off.  [place], [address].D., BCPS Clinical Pharmacist Pager: [phone] 09/01/2012 1:03 PM

## 2012-09-01 NOTE — Progress Notes (Signed)
Pt arrived from pacu, VSS, neuro intact, oriented to unit and routine,  Call bell within reach, family at bedside. Will continue to monitor.

## 2012-09-01 NOTE — H&P (View-Only) (Signed)
[facility] [place]  Established Carotid Patient  History of Present Illness  Theresa Huang is a 78 y.o. (01/09/1931) female who presents with chief complaint:  R carotid stenosis > 80%.  Patient has been seen by Cardiology and cleared for surgery.  Pt has had no stroke or TIA sx.  Past Medical History   Diagnosis  Date   .  DDD (degenerative disc disease)    .  Inflammatory polyps of colon with rectal bleeding    .  Hemorrhoids    .  Retinopathy of left eye    .  Hearing loss of both ears      wears bilateral hearing aids   .  Glaucoma    .  Diastolic dysfunction    .  Diabetes mellitus    .  Hypertension    .  Hyperlipidemia    .  COPD (chronic obstructive pulmonary disease)    .  Hypothyroid    .  Anemia    .  Irregular heart beat    .  DVT (deep venous thrombosis)    .  Cancer      SCC    Past Surgical History   Procedure  Date   .  Squamous cell removed    .  Eye surgery      cataracts   .  Hemorrhoid surgery    .  Abdominal hysterectomy    .  Throat surgery    .  Tonsillectomy     Social History  History   Substance Use Topics   .  Smoking status:  Former Smoker     Types:  Cigarettes     Quit date:  09/17/1987   .  Smokeless tobacco:  Never Used   .  Alcohol Use:  No    Family History  Family History   Problem  Relation  Age of Onset   .  Depression  Mother    .  Diabetes  Mother    .  CAD  Mother    .  Heart disease  Mother    .  Hyperlipidemia  Daughter    .  Cancer  Daughter    .  CAD  Father    .  Heart disease  Father    .  Diabetes  Brother    .  Hypertension  Son     Allergies   Allergen  Reactions   .  Tetracycline      REACTION: arms swelling, itching, blisters    Current Outpatient Prescriptions   Medication  Sig  Dispense  Refill   .  acetaminophen (TYLENOL ARTHRITIS PAIN) 650 MG CR tablet  Take 650 mg by mouth as needed.     [name]  ADVAIR DISKUS 250-50 MCG/DOSE AEPB  INHALE 1 PUFF TWICE DAILY  60 each  2   .   albuterol (PROVENTIL) (2.5 MG/3ML) 0.083% nebulizer solution  Take 2.5 mg by nebulization every 4 (four) hours as needed.     .  ALLERGY 10 MG tablet  TAKE 1 TABLET BY MOUTH ONCE DAILY  30 tablet  2   .  AMBULATORY NON FORMULARY MEDICATION  Medication Name: Home continuous oxygen. 3 liters. Please provide portable prn.  Dx: COPD  1 Device  prn   .  aspirin 81 MG tablet  Take 81 mg by mouth daily.     .  Calcium-Vitamin D (CALTRATE 600 PLUS-VIT D PO)  Take by mouth daily.     [name]  E-Z Ject Lancets Super Thin MISC  TEST TWICE DAILY  100 each  1   .  EPINEPHrine (EPI-PEN) 0.3 mg/0.3 mL DEVI  USE AS DIRECTED  2 Device  3   .  glucose blood (TRUETRACK TEST) test strip  Use as instructed  100 each  prn   .  GLUCOTROL XL 10 MG 24 hr tablet  TAKE ONE TABLET BY MOUTH EVERY DAY  30 tablet  2   .  ipratropium (ATROVENT HFA) 17 MCG/ACT inhaler  Inhale 2 puffs into the lungs 4 (four) times daily.     .  Multiple Vitamin (MULTIVITAMIN) tablet  Take 1 tablet by mouth daily.     .  naproxen (NAPROSYN) 500 MG tablet  Take 1 tablet (500 mg total) by mouth 2 (two) times daily as needed.  30 tablet  0   .  nitroGLYCERIN (NITROSTAT) 0.4 MG SL tablet  TAKE ONE TABLET AS NEEDEDFOR CHEST PAIN  25 tablet  0   .  PROAIR HFA 108 (90 BASE) MCG/ACT inhaler  USE 2-4 PUFFS BY MOUTH EVERY 4 HOURS AS NEEDED FOR SHORTNESS OF BREATH  8.5 each  2   .  SPIRIVA HANDIHALER 18 MCG inhalation capsule  INHALE 1 CAPSULE BY MOUTH DAILY  30 each  2   .  SYNTHROID 50 MCG tablet  TAKE ONE TABLET BY MOUTH EVERY DAY  30 tablet  2   .  triamcinolone (KENALOG) 0.1 % cream  Apply topically as needed.     [name]  ZOCOR 40 MG tablet  TAKE ONE TABLET BY MOUTH AT BEDTIME  30 tablet  2   .  ZOLOFT 100 MG tablet  TAKE ONE TABLET BY MOUTH EVERY DAY  30 tablet  2    ROS: [x]  Positive [ ]  Denies  General: [ ]  Weight loss, [ ]  Fever, [ ]  chills  Neurologic: [x]  Dizziness, [ ]  Blackouts, [ ]  Seizure [ ]  Stroke, [ ]  "Mini stroke", [ ]  Slurred speech, [ ]   Temporary blindness; [ ]  weakness in arms or legs, [x]  Hoarseness  Cardiac: [ ]  Chest pain/pressure, [ ]  Shortness of breath at rest [ ]  Shortness of breath with exertion, [ ]  Atrial fibrillation or irregular heartbeat, [x]  cardiac arrhythmia  Vascular: [ ]  Pain in legs with walking, [ ]  Pain in legs at rest, [ ]  Pain in legs at night, [ ]  Non-healing ulcer, [ ]  Blood clot in vein/DVT,  Pulmonary: [ ]  Home oxygen, [ ]  Productive cough, [ ]  Coughing up blood, [ ]  Asthma, [ ]  Wheezing  Musculoskeletal: [ ]  Arthritis, [ ]  Low back pain, [ ]  Joint pain  Hematologic: [ ]  Easy Bruising, [ ]  Anemia; [ ]  Hepatitis  Gastrointestinal: [ ]  Blood in stool, [ ]  Gastroesophageal Reflux/heartburn, [ ]  Trouble swallowing  Urinary: [ ]  chronic Kidney disease, [ ]  on HD - [ ]  MWF or [ ]  TTHS, [ ]  Burning with urination, [ ]  Difficulty urinating  Skin: [ ]  Rashes, [ ]  Wounds  Psychological: [ ]  Anxiety, [ ]  Depression   Physical Examination  Filed Vitals:   08/21/12 1350 08/21/12 1355  BP: 110/53 121/55  Pulse: 70 72  Resp: 16   Height: 5\' 3"  (1.6 m)   Weight: 167 lb (75.751 kg)   SpO2: 95%    Body mass index is 29.58 kg/(m^2).  General: A&O x 3, WDWN  Eyes: PERRLA, EOMI  Pulmonary: Sym exp, good air movt, CTAB, no rales, rhonchi, & wheezing  Cardiac: RRR, Nl S1, S2, no Murmurs, rubs or gallops  Gastrointestinal: soft, NTND, -G/R, - HSM, - masses, - CVAT B  Musculoskeletal: M/S 5/5 throughout , Extremities without ischemic changes   Neurologic: CN 2-12 intact , Pain and light touch intact in extremities , Motor exam as listed above  Medical Decision Making  Theresa Huang is a 78 y.o. female who presents with: asx R ICA stenosis > 80%   Based on the patient's vascular studies and examination, I have offered the patient: R CEA, scheduled for 17 DEC 13.  I discussed in depth with the patient the nature of atherosclerosis, and emphasized the importance of maximal medical management  including strict control of blood pressure, blood glucose, and lipid levels, antiplatelet agents, obtaining regular exercise, and cessation of smoking.  The patient is aware that without maximal medical management the underlying atherosclerotic disease process will progress, limiting the benefit of any interventions.  Thank you for allowing [place] to participate in this patient's care.  Leonides Sake, MD [facility] [place] Office: [phone] Pager: [phone]  08/21/2012, 2:27 PM

## 2012-09-01 NOTE — Anesthesia Preprocedure Evaluation (Addendum)
Anesthesia Evaluation  Patient identified by MRN, date of birth, ID band Patient awake    Reviewed: Allergy & Precautions, H&P , NPO status , Patient's Chart, lab work & pertinent test results, reviewed documented beta blocker date and time   Airway Mallampati: II TM Distance: >3 FB Neck ROM: full    Dental  (+) Edentulous Upper and Edentulous Lower   Pulmonary neg pulmonary ROS, shortness of breath, asthma , pneumonia -, resolved, COPD COPD inhaler and oxygen dependent,  breath sounds clear to auscultation        Cardiovascular + angina + Past MI and + Peripheral Vascular Disease negative cardio ROS  Rhythm:regular     Neuro/Psych  Headaches, TIAnegative psych ROS   GI/Hepatic negative GI ROS, Neg liver ROS,   Endo/Other  diabetes, Insulin DependentHypothyroidism   Renal/GU Renal disease  negative genitourinary   Musculoskeletal   Abdominal   Peds  Hematology  (+) Blood dyscrasia, anemia ,   Anesthesia Other Findings See surgeon's H&P   Reproductive/Obstetrics negative OB ROS                          Anesthesia Physical Anesthesia Plan  ASA: IV  Anesthesia Plan: General   Post-op Pain Management:    Induction: Intravenous  Airway Management Planned: Oral ETT  Additional Equipment: Arterial line  Intra-op Plan:   Post-operative Plan: Extubation in OR  Informed Consent: I have reviewed the patients History and Physical, chart, labs and discussed the procedure including the risks, benefits and alternatives for the proposed anesthesia with the patient or authorized representative who has indicated his/her understanding and acceptance.   Dental Advisory Given  Plan Discussed with: CRNA, Surgeon and Anesthesiologist  Anesthesia Plan Comments:        Anesthesia Quick Evaluation

## 2012-09-01 NOTE — Anesthesia Postprocedure Evaluation (Signed)
Anesthesia Post Note  Patient: Theresa Huang  Procedure(s) Performed: Procedure(s) (LRB): ENDARTERECTOMY CAROTID (Right) PATCH ANGIOPLASTY (Right)  Anesthesia type: general  Patient location: PACU  Post pain: Pain level controlled  Post assessment: Patient's Cardiovascular Status Stable  Last Vitals:  Filed Vitals:   09/01/12 1100  BP: 112/40  Pulse: 76  Temp:   Resp: 19    Post vital signs: Reviewed and stable  Level of consciousness: sedated  Complications: No apparent anesthesia complications

## 2012-09-01 NOTE — Preoperative (Signed)
Beta Blockers   Reason not to administer Beta Blockers:Not Applicable 

## 2012-09-02 ENCOUNTER — Telehealth: Payer: Self-pay | Admitting: Vascular Surgery

## 2012-09-02 LAB — BASIC METABOLIC PANEL
CO2: 27 mEq/L (ref 19–32)
Calcium: 8.9 mg/dL (ref 8.4–10.5)
Chloride: 105 mEq/L (ref 96–112)
Potassium: 4.1 mEq/L (ref 3.5–5.1)
Sodium: 140 mEq/L (ref 135–145)

## 2012-09-02 LAB — CBC
Hemoglobin: 11.4 g/dL — ABNORMAL LOW (ref 12.0–15.0)
Platelets: 155 10*3/uL (ref 150–400)
RBC: 3.72 MIL/uL — ABNORMAL LOW (ref 3.87–5.11)
WBC: 8.8 10*3/uL (ref 4.0–10.5)

## 2012-09-02 LAB — GLUCOSE, CAPILLARY
Glucose-Capillary: 101 mg/dL — ABNORMAL HIGH (ref 70–99)
Glucose-Capillary: 139 mg/dL — ABNORMAL HIGH (ref 70–99)

## 2012-09-02 MED ORDER — OXYCODONE HCL 5 MG PO TABS: 5.0000 mg | ORAL_TABLET | ORAL | Status: DC | PRN

## 2012-09-02 NOTE — Progress Notes (Signed)
Discharge instructions given-forms signed, copies given, script for med given. PIV removed. VSS-no signs of infection at surgical site. Pt verbalized understanding-family at bedside to hear.

## 2012-09-02 NOTE — Progress Notes (Addendum)
[facility] Progress Note  09/02/2012 7:18 AM POD 1  Subjective:  C/o headache this am.  States it is slightly better, but still there.  90's systolic last pm, but 110's-120's since then Otherwise VSS 93%2LO2NC  Filed Vitals:   09/02/12 0400  BP: 123/61  Pulse: 85  Temp: 99 F (37.2 C)  Resp: 16     Physical Exam: Neuro:  In tact. Incision:  C/d/i with minimal ecchymosis  CBC    Component Value Date/Time   WBC 8.8 09/02/2012 0500   RBC 3.72* 09/02/2012 0500   HGB 11.4* 09/02/2012 0500   HCT 34.6* 09/02/2012 0500   PLT 155 09/02/2012 0500   MCV 93.0 09/02/2012 0500   MCH 30.6 09/02/2012 0500   MCHC 32.9 09/02/2012 0500   RDW 12.5 09/02/2012 0500   LYMPHSABS 2.3 07/09/2012 1231   MONOABS 0.5 07/09/2012 1231   EOSABS 0.2 07/09/2012 1231   BASOSABS 0.0 07/09/2012 1231    BMET    Component Value Date/Time   NA 140 09/02/2012 0500   K 4.1 09/02/2012 0500   CL 105 09/02/2012 0500   CO2 27 09/02/2012 0500   GLUCOSE 103* 09/02/2012 0500   BUN 16 09/02/2012 0500   CREATININE 1.25* 09/02/2012 0500   CREATININE 1.46* 03/24/2012 1019   CALCIUM 8.9 09/02/2012 0500   GFRNONAA 40* 09/02/2012 0500   GFRAA 46* 09/02/2012 0500     Intake/Output Summary (Last 24 hours) at 09/02/12 0718 Last data filed at 09/02/12 0600  Gross per 24 hour  Intake   2850 ml  Output   2425 ml  Net    425 ml      Assessment/Plan:  This is a 78 y.o. female who is s/p right CEA POD 1  -c/o of HA this am-keep at least this am and observe.  Good blood pressure this am and not requiring any gtts.  Continue tylenol for HA. -continue mobilization and OOB  Doreatha Massed, PA-C [facility] [phone]  Addendum  I have independently interviewed and examined the patient, and I agree with the physician assistant's findings.  Neck without hematoma.  Neurologically intact.  Voice better this AM compared to baseline yesterday.  BP ok over last 6 hours.  I  doubt cerebral hyperperfusion in this patient.  Some atelectasis present on exam, but patient is on home oxygen so likely some baseline element from her COPD.  If she remains stable, she can discharged later today.  Leonides Sake, MD [facility] [place] Office: [phone] Pager: [phone]  09/02/2012, 10:14 AM

## 2012-09-02 NOTE — Telephone Encounter (Signed)
Spoke with pts dtr, aware of monitor results

## 2012-09-02 NOTE — Telephone Encounter (Addendum)
Message copied by Shari Prows on Wed Sep 02, 2012  3:26 PM ------      Message from: Melene Plan      Created: Wed Sep 02, 2012  3:03 PM                   ----- Message -----         From: Lars Mage, PA         Sent: 09/02/2012   2:57 PM           To: Melene Plan, RN            Carotid f/u with Dr. Imogene Burn in 2 weeks.  I scheduled an appt for the above pt on 09/25/12 at 11:15am w/ blc. I mailed an appt letter and also lm for pt on her ph#/awt

## 2012-09-02 NOTE — Discharge Summary (Signed)
[facility] Discharge Summary   Patient ID:  Theresa Huang MRN: [id number] DOB/AGE: 1931/09/10 78 y.o.  Admit date: 09/01/2012 Discharge date: 09/02/2012 Date of Surgery: 09/01/2012 Surgeon: Surgeon(s): Fransisco Hertz, MD  Admission Diagnosis: CAROTID STENOSIS RIGHT  Discharge Diagnoses:  CAROTID STENOSIS RIGHT  Secondary Diagnoses: Past Medical History  Diagnosis Date  . DDD (degenerative disc disease)   . Inflammatory polyps of colon with rectal bleeding   . Hemorrhoids   . Retinopathy of left eye   . Hearing loss of both ears     wears bilateral hearing aids  . Glaucoma   . Diastolic dysfunction   . Hyperlipidemia   . Hypothyroid   . Anemia   . Irregular heart beat   . Cancer     SCC  . Holter monitor, abnormal     wearing for 21 days  . Syncope   . Myocardial infarction 2006  . Pneumonia     hx  . Shortness of breath     most of time  . COPD (chronic obstructive pulmonary disease)     wears oxygen at 3L prn daytime and 3L at night   . Diabetes mellitus     fasting blood sugar 80-100  . Headache     migranes  . Dizziness     Procedure(s): ENDARTERECTOMY CAROTID PATCH ANGIOPLASTY  Discharged Condition: good  HPI: YULIANNA Huang is a 78 y.o. (04/27/1931) female who presents with chief complaint: R carotid stenosis > 80%. Patient has been seen by Cardiology and cleared for surgery. Pt has had no stroke or TIA sx.     Hospital Course:  [name] is a 78 y.o. female is S/P Right Procedure(s): ENDARTERECTOMY CAROTID PATCH ANGIOPLASTY Extubated: POD # 0 Post-op wounds healing well Pt. Ambulating, voiding and taking PO diet without difficulty. Pt pain controlled with PO pain meds. Labs as below Complications:none  Consults:     Significant Diagnostic Studies: CBC Lab Results  Component Value Date   WBC 8.8 09/02/2012   HGB 11.4* 09/02/2012   HCT 34.6* 09/02/2012   MCV 93.0 09/02/2012   PLT 155 09/02/2012     BMET    Component Value Date/Time   NA 140 09/02/2012 0500   K 4.1 09/02/2012 0500   CL 105 09/02/2012 0500   CO2 27 09/02/2012 0500   GLUCOSE 103* 09/02/2012 0500   BUN 16 09/02/2012 0500   CREATININE 1.25* 09/02/2012 0500   CREATININE 1.46* 03/24/2012 1019   CALCIUM 8.9 09/02/2012 0500   GFRNONAA 40* 09/02/2012 0500   GFRAA 46* 09/02/2012 0500   COAG Lab Results  Component Value Date   INR 1.06 08/25/2012     Disposition:  Discharge to :Home Discharge Orders    Future Appointments: Provider: Department: Dept Phone: Center:   09/30/2012 4:30 PM Lewayne Bunting, MD [facility] [phone] [email or web address]     Future Orders Please Complete By Expires   Resume previous diet      Driving Restrictions      Comments:   No driving for 2 weeks   Lifting restrictions      Comments:   No lifting for 6 weeks   Call MD for:  temperature >100.5      Call MD for:  redness, tenderness, or signs of infection (pain, swelling, bleeding, redness, odor or green/yellow discharge around incision site)      Call MD for:  severe or increased pain, loss or decreased feeling  in  affected limb(s)      Increase activity slowly      Comments:   Walk with assistance use walker or cane as needed   May shower       Discharge patient      Comments:   Discharge pt to home      Syrai, [place]  Home Medication Instructions AVW:[id number]   Printed on:09/02/12 1502  Medication Information                    AMBULATORY NON FORMULARY MEDICATION Medication Name: Home continuous oxygen.  3 liters. Please provide portable prn. Dx: COPD           albuterol (PROVENTIL) (2.5 MG/3ML) 0.083% nebulizer solution Take 2.5 mg by nebulization every 4 (four) hours as needed. For shortness of breath           aspirin 81 MG tablet Take 81 mg by mouth daily.             ipratropium (ATROVENT HFA) 17 MCG/ACT inhaler Inhale 2 puffs into the lungs 4 (four) times daily.             Calcium-Vitamin D (CALTRATE 600 PLUS-VIT D PO) Take 1 tablet by mouth daily.            Multiple Vitamin (MULTIVITAMIN) tablet Take 1 tablet by mouth daily.             acetaminophen (TYLENOL ARTHRITIS PAIN) 650 MG CR tablet Take 650 mg by mouth every 8 (eight) hours as needed. For pain           glucose blood (TRUETRACK TEST) test strip Use as instructed           E-Z Ject Lancets Super Thin MISC TEST TWICE DAILY           GLUCOTROL XL 10 MG 24 hr tablet TAKE ONE TABLET BY MOUTH EVERY DAY           ALLERGY 10 MG tablet TAKE 1 TABLET BY MOUTH ONCE DAILY           naproxen (NAPROSYN) 500 MG tablet Take 1 tablet (500 mg total) by mouth 2 (two) times daily as needed.           Insulin Pen Needle (PEN NEEDLES 29GX1/2") 29G X MISC            Fluticasone-Salmeterol (ADVAIR) 250-50 MCG/DOSE AEPB Inhale 1 puff into the lungs every 12 (twelve) hours.           nitroGLYCERIN (NITROSTAT) 0.4 MG SL tablet Place 0.4 mg under the tongue every 5 (five) minutes as needed. For chest pain           albuterol (PROVENTIL HFA;VENTOLIN HFA) 108 (90 BASE) MCG/ACT inhaler Inhale 2-4 puffs into the lungs every 4 (four) hours as needed. For shortness of breath           tiotropium (SPIRIVA) 18 MCG inhalation capsule Place 18 mcg into inhaler and inhale daily.           levothyroxine (SYNTHROID, LEVOTHROID) 50 MCG tablet Take 50 mcg by mouth daily.           simvastatin (ZOCOR) 40 MG tablet Take 40 mg by mouth every evening.           sertraline (ZOLOFT) 100 MG tablet Take 100 mg by mouth daily.           oxyCODONE (OXY IR/ROXICODONE) 5 MG immediate release tablet Take  1 tablet (5 mg total) by mouth once as needed.           oxyCODONE (ROXICODONE) 5 MG immediate release tablet Take 1 tablet (5 mg total) by mouth every 4 (four) hours as needed for pain.            Verbal and written Discharge instructions given to the patient. Wound care per Discharge AVS Follow-up Information     Follow up with Nilda Simmer, MD. In 2 weeks. (sent)    Contact information:   [address] [place] [address] [phone]          Signed: Clinton Gallant [facility] 09/02/2012, 3:02 PM    Addendum  I have independently interviewed and examined the patient, and I agree with the physician assistant's discharge summary.  This patient underwent an uneventful right carotid endarterectomy.  Her post-operative course was unremarkable and is being discharged on POD #1 neurological intact with intact wound.  She will follow up in the office in 2 weeks.  Leonides Sake, MD [facility] [place] Office: [phone] Pager: [phone]  09/02/2012, 3:37 PM

## 2012-09-03 ENCOUNTER — Encounter (HOSPITAL_COMMUNITY): Payer: Self-pay | Admitting: Vascular Surgery

## 2012-09-03 LAB — URINE CULTURE: Colony Count: >=100000

## 2012-09-04 ENCOUNTER — Other Ambulatory Visit: Payer: Self-pay | Admitting: *Deleted

## 2012-09-04 DIAGNOSIS — N3090 Cystitis, unspecified without hematuria: Secondary | ICD-10-CM

## 2012-09-04 MED ORDER — CIPROFLOXACIN HCL 250 MG PO TABS: 250.0000 mg | ORAL_TABLET | Freq: Two times a day (BID) | ORAL | Status: DC

## 2012-09-24 ENCOUNTER — Encounter: Payer: Self-pay | Admitting: Vascular Surgery

## 2012-09-25 ENCOUNTER — Ambulatory Visit (INDEPENDENT_AMBULATORY_CARE_PROVIDER_SITE_OTHER): Payer: Medicare PPO | Admitting: Vascular Surgery

## 2012-09-25 ENCOUNTER — Encounter: Payer: Self-pay | Admitting: Vascular Surgery

## 2012-09-25 VITALS — BP 134/56 | HR 63 | Ht 63.0 in | Wt 165.4 lb

## 2012-09-25 DIAGNOSIS — I6529 Occlusion and stenosis of unspecified carotid artery: Secondary | ICD-10-CM

## 2012-09-25 DIAGNOSIS — Z48812 Encounter for surgical aftercare following surgery on the circulatory system: Secondary | ICD-10-CM

## 2012-09-25 NOTE — Progress Notes (Signed)
[facility] [place]  Postoperative Visit  History of Present Illness  Theresa Huang is a 79 y.o. female who presents for postoperative follow-up for: R CEA (Date: 09/01/12).  The patient's neck incision is healed.  The patient has had no stroke or TIA symptoms.  Physical Examination  Filed Vitals:   09/25/12 1126  BP: 134/56  Pulse:     R Neck: Incision is healing Neuro: CN 2-12 are intact , Motor strength is 5/5 bilaterally, sensation is grossly intact, voice is raspy as prior to procedure, improved volume today  Medical Decision Making  Theresa Huang is a 79 y.o. female who presents s/p R CEA.  The patient's neck incision is healing with no stroke symptoms. I discussed in depth with the patient the nature of atherosclerosis, and emphasized the importance of maximal medical management including strict control of blood pressure, blood glucose, and lipid levels, obtaining regular exercise, and cessation of smoking.  The patient is aware that without maximal medical management the underlying atherosclerotic disease process will progress, limiting the benefit of any interventions. The patient's surveillance will included routine carotid duplex studies which will be completed in: 3 months, at which time the patient will be re-evaluated.   I emphasized the importance of routine surveillance of the carotid arteries as recurrence of stenosis is possible, especially with proper management of underlying atherosclerotic disease. The patient agrees to participate in their maximal medical care and routine surveillance.  Thank you for allowing [place] to participate in this patient's care.  Leonides Sake, MD [facility] [place] Office: [phone] Pager: [phone]

## 2012-09-30 ENCOUNTER — Encounter: Payer: Self-pay | Admitting: Cardiology

## 2012-09-30 ENCOUNTER — Ambulatory Visit (INDEPENDENT_AMBULATORY_CARE_PROVIDER_SITE_OTHER): Payer: Medicare HMO | Admitting: Cardiology

## 2012-09-30 VITALS — BP 120/70 | HR 77 | Wt 167.0 lb

## 2012-09-30 DIAGNOSIS — I679 Cerebrovascular disease, unspecified: Secondary | ICD-10-CM

## 2012-09-30 DIAGNOSIS — R42 Dizziness and giddiness: Secondary | ICD-10-CM

## 2012-09-30 NOTE — Assessment & Plan Note (Signed)
Continue aspirin and statin. Lipids and liver followed by primary care.

## 2012-09-30 NOTE — Patient Instructions (Addendum)
Your physician recommends that you schedule a follow-up appointment in: as needed  

## 2012-09-30 NOTE — Assessment & Plan Note (Signed)
No further episodes. Monitor unrevealing. Myoview showed normal LV function and no ischemia. No plans for further cardiac evaluation.

## 2012-09-30 NOTE — Progress Notes (Signed)
HPI: 79 year old female for fu of dizziness and syncope. Carotid Dopplers in October of 2013 showed a 70-99% right carotid stenosis. There was a < 50% left carotid stenosis. Patient referred to vascular surgery. Echocardiogram repeated in February of 2012 in [place]. LV function was normal. There was grade 1 diastolic dysfunction. There was mild left atrial enlargement and mild pulmonary hypertension. The patient has had intermittent dizzy spells for several years. She did have a syncopal episode in 2011 evaluated at [facility] and with [facility] cardiology. Myoview in November of 2013 showed an ejection fraction of 79% and was normal. CardioNet in November of 2013 showed sinus rhythm with PACs, PVCs and brief PAT. Patient had a right carotid endarterectomy on 09/01/2012. Since that time, the patient has dyspnea with more extreme activities but not with routine activities. It is relieved with rest. It is not associated with chest pain. There is no orthopnea, PND or pedal edema. There is no syncope or palpitations. There is no exertional chest pain.    Current Outpatient Prescriptions  Medication Sig Dispense Refill  . acetaminophen (TYLENOL ARTHRITIS PAIN) 650 MG CR tablet Take 650 mg by mouth every 8 (eight) hours as needed. For pain      . albuterol (PROVENTIL HFA;VENTOLIN HFA) 108 (90 BASE) MCG/ACT inhaler Inhale 2-4 puffs into the lungs every 4 (four) hours as needed. For shortness of breath      . albuterol (PROVENTIL) (2.5 MG/3ML) 0.083% nebulizer solution Take 2.5 mg by nebulization every 4 (four) hours as needed. For shortness of breath      . ALLERGY 10 MG tablet TAKE 1 TABLET BY MOUTH ONCE DAILY  30 tablet  2  . AMBULATORY NON FORMULARY MEDICATION Medication Name: Home continuous oxygen.  3 liters. Please provide portable prn. Dx: COPD  1 Device  prn  . aspirin 81 MG tablet Take 81 mg by mouth daily.        . Calcium-Vitamin D (CALTRATE 600 PLUS-VIT D PO) Take 1 tablet by  mouth daily.       . Haynes Kerns Lancets Super Thin MISC TEST TWICE DAILY  100 each  1  . Fluticasone-Salmeterol (ADVAIR) 250-50 MCG/DOSE AEPB Inhale 1 puff into the lungs every 12 (twelve) hours.      [name] glucose blood (TRUETRACK TEST) test strip Use as instructed  100 each  prn  . GLUCOTROL XL 10 MG 24 hr tablet TAKE ONE TABLET BY MOUTH EVERY DAY  30 tablet  2  . Insulin Pen Needle (PEN NEEDLES 29GX1/2") 29G X MISC       . ipratropium (ATROVENT HFA) 17 MCG/ACT inhaler Inhale 2 puffs into the lungs 4 (four) times daily.       [name] levothyroxine (SYNTHROID, LEVOTHROID) 50 MCG tablet Take 50 mcg by mouth daily.      . Multiple Vitamin (MULTIVITAMIN) tablet Take 1 tablet by mouth daily.        . nitroGLYCERIN (NITROSTAT) 0.4 MG SL tablet Place 0.4 mg under the tongue every 5 (five) minutes as needed. For chest pain      . oxyCODONE (OXY IR/ROXICODONE) 5 MG immediate release tablet Take 1 tablet (5 mg total) by mouth once as needed.  30 tablet  0  . sertraline (ZOLOFT) 100 MG tablet Take 100 mg by mouth daily.      . simvastatin (ZOCOR) 40 MG tablet Take 40 mg by mouth every evening.      . tiotropium (SPIRIVA) 18 MCG inhalation capsule Place 18  mcg into inhaler and inhale daily.         Past Medical History  Diagnosis Date  . DDD (degenerative disc disease)   . Inflammatory polyps of colon with rectal bleeding   . Hemorrhoids   . Retinopathy of left eye   . Hearing loss of both ears     wears bilateral hearing aids  . Glaucoma   . Diastolic dysfunction   . Hyperlipidemia   . Hypothyroid   . Anemia   . Irregular heart beat   . Cancer     SCC  . Holter monitor, abnormal     wearing for 21 days  . Syncope   . Myocardial infarction 2006  . Pneumonia     hx  . Shortness of breath     most of time  . COPD (chronic obstructive pulmonary disease)     wears oxygen at 3L prn daytime and 3L at night   . Diabetes mellitus     fasting blood sugar 80-100  . Headache     migranes  .  Dizziness   . Carotid artery occlusion     Past Surgical History  Procedure Date  . Squamous cell removed   . Eye surgery     cataracts  . Hemorrhoid surgery   . Abdominal hysterectomy   . Throat surgery   . Tonsillectomy   . Colonoscopy w/ polypectomy   . Endarterectomy 09/01/2012    Procedure: ENDARTERECTOMY CAROTID;  Surgeon: Fransisco Hertz, MD;  Location: [facility] OR;  Service: Vascular;  Laterality: Right;  . Patch angioplasty 09/01/2012    Procedure: PATCH ANGIOPLASTY;  Surgeon: Fransisco Hertz, MD;  Location: [facility] OR;  Service: Vascular;  Laterality: Right;  Vascu-Guard Patch Angioplasty  . Carotid endarterectomy     History   Social History  . Marital Status: Widowed    Spouse Name: N/A    Number of Children: 4  . Years of Education: N/A   Occupational History  .     Social History Main Topics  . Smoking status: Former Smoker    Types: Cigarettes    Quit date: 09/17/1987  . Smokeless tobacco: Never Used  . Alcohol Use: No  . Drug Use: No  . Sexually Active:    Other Topics Concern  . Not on file   Social History Narrative  . No narrative on file    ROS: no fevers or chills, productive cough, hemoptysis, dysphasia, odynophagia, melena, hematochezia, dysuria, hematuria, rash, seizure activity, orthopnea, PND, pedal edema, claudication. Remaining systems are negative.  Physical Exam: Well-developed well-nourished in no acute distress.  Skin is warm and dry.  HEENT is normal.  Neck is supple. Right carotid endarterectomy incision without evidence of infection. Chest diminished breath sounds throughout Cardiovascular exam is regular rate and rhythm.  Abdominal exam nontender or distended. No masses palpated. Extremities show no edema. neuro grossly intact

## 2012-10-09 ENCOUNTER — Other Ambulatory Visit: Payer: Self-pay | Admitting: Family Medicine

## 2012-10-12 ENCOUNTER — Other Ambulatory Visit: Payer: Self-pay | Admitting: *Deleted

## 2012-10-12 DIAGNOSIS — I6529 Occlusion and stenosis of unspecified carotid artery: Secondary | ICD-10-CM

## 2012-10-12 DIAGNOSIS — Z48812 Encounter for surgical aftercare following surgery on the circulatory system: Secondary | ICD-10-CM

## 2012-10-15 ENCOUNTER — Other Ambulatory Visit: Payer: Self-pay | Admitting: Family Medicine

## 2012-11-03 ENCOUNTER — Telehealth: Payer: Self-pay | Admitting: *Deleted

## 2012-11-03 MED ORDER — TRAMADOL HCL 50 MG PO TABS: 50.0000 mg | ORAL_TABLET | Freq: Every day | ORAL | Status: DC | PRN

## 2012-11-03 NOTE — Telephone Encounter (Signed)
i will send over rx for tramadol We could also consider changing her zoloft to cymbalta which also helps with chrnoic back pain

## 2012-11-03 NOTE — Telephone Encounter (Signed)
Patient calls and wants to know if you can give her something for her arthritis pain, she is out of her pain meds

## 2012-11-03 NOTE — Telephone Encounter (Signed)
Pt notified med sent and will let us know if wants to try the Cymbalta. Also states they are going to [facility] in April for family reunion and can not take her O2 tank on the plane and wanted to know if she could get an order for portable O2 to take on the plane.

## 2012-11-03 NOTE — Telephone Encounter (Signed)
Ok to write for portable oxygen. Can do as a non-amb rx.

## 2012-11-04 MED ORDER — AMBULATORY NON FORMULARY MEDICATION: Status: DC

## 2012-11-04 NOTE — Telephone Encounter (Signed)
[facility]2 [facility]

## 2012-11-12 ENCOUNTER — Other Ambulatory Visit: Payer: Self-pay | Admitting: Family Medicine

## 2012-11-16 ENCOUNTER — Other Ambulatory Visit: Payer: Self-pay | Admitting: Family Medicine

## 2012-11-18 ENCOUNTER — Other Ambulatory Visit: Payer: Self-pay | Admitting: *Deleted

## 2012-11-18 MED ORDER — AMBULATORY NON FORMULARY MEDICATION: Status: DC

## 2012-12-15 ENCOUNTER — Other Ambulatory Visit: Payer: Self-pay | Admitting: Family Medicine

## 2012-12-17 ENCOUNTER — Other Ambulatory Visit: Payer: Self-pay | Admitting: Family Medicine

## 2012-12-25 ENCOUNTER — Ambulatory Visit: Payer: Medicare PPO | Admitting: Vascular Surgery

## 2012-12-25 ENCOUNTER — Other Ambulatory Visit: Payer: Medicare PPO

## 2013-01-11 ENCOUNTER — Other Ambulatory Visit: Payer: Self-pay | Admitting: Family Medicine

## 2013-01-11 NOTE — Telephone Encounter (Signed)
Needs appointment

## 2013-02-09 ENCOUNTER — Other Ambulatory Visit: Payer: Self-pay | Admitting: Family Medicine

## 2013-02-10 NOTE — Telephone Encounter (Signed)
Needs appointment

## 2013-02-15 ENCOUNTER — Other Ambulatory Visit: Payer: Self-pay | Admitting: Family Medicine

## 2013-03-11 ENCOUNTER — Other Ambulatory Visit: Payer: Self-pay | Admitting: Family Medicine

## 2013-03-11 ENCOUNTER — Other Ambulatory Visit: Payer: Self-pay | Admitting: *Deleted

## 2013-03-11 MED ORDER — GLIPIZIDE ER 10 MG PO TB24: ORAL_TABLET | ORAL | Status: DC

## 2013-03-11 MED ORDER — LORATADINE 10 MG PO TABS: ORAL_TABLET | ORAL | Status: DC

## 2013-03-11 MED ORDER — TRAMADOL HCL 50 MG PO TABS: ORAL_TABLET | ORAL | Status: DC

## 2013-03-11 NOTE — Telephone Encounter (Signed)
Refills given for one time only and daughter will call and schedule her an appointment as was informed this would be the last refill she will get until seen in office. Barry Dienes, LPN

## 2013-03-17 ENCOUNTER — Telehealth: Payer: Self-pay | Admitting: Family Medicine

## 2013-03-17 NOTE — Telephone Encounter (Signed)
Please call patient have her schedule a Medicare wellness exam.

## 2013-03-17 NOTE — Telephone Encounter (Signed)
Called pt lvm.Theresa Huang  

## 2013-03-18 NOTE — Telephone Encounter (Signed)
Called and appt made.Theresa Huang

## 2013-03-25 ENCOUNTER — Other Ambulatory Visit: Payer: Self-pay

## 2013-03-26 ENCOUNTER — Encounter: Payer: Self-pay | Admitting: Family Medicine

## 2013-03-26 ENCOUNTER — Ambulatory Visit (INDEPENDENT_AMBULATORY_CARE_PROVIDER_SITE_OTHER): Payer: Medicare HMO | Admitting: Family Medicine

## 2013-03-26 VITALS — BP 95/52 | HR 72 | Wt 160.0 lb

## 2013-03-26 DIAGNOSIS — M545 Low back pain, unspecified: Secondary | ICD-10-CM

## 2013-03-26 DIAGNOSIS — E039 Hypothyroidism, unspecified: Secondary | ICD-10-CM

## 2013-03-26 DIAGNOSIS — E119 Type 2 diabetes mellitus without complications: Secondary | ICD-10-CM

## 2013-03-26 DIAGNOSIS — J449 Chronic obstructive pulmonary disease, unspecified: Secondary | ICD-10-CM

## 2013-03-26 DIAGNOSIS — G8929 Other chronic pain: Secondary | ICD-10-CM | POA: Insufficient documentation

## 2013-03-26 LAB — POCT GLYCOSYLATED HEMOGLOBIN (HGB A1C): Hemoglobin A1C: 6.4

## 2013-03-26 MED ORDER — NITROGLYCERIN 0.4 MG SL SUBL: 0.4000 mg | SUBLINGUAL_TABLET | SUBLINGUAL | Status: AC | PRN

## 2013-03-26 NOTE — Patient Instructions (Addendum)
Due for your eye exam.   Come for labs fasting.

## 2013-03-26 NOTE — Progress Notes (Signed)
  Subjective:    Patient ID: Theresa Huang, female    DOB: 22-Jun-1931, 79 y.o.   MRN: [id number]  HPI DM - Brought in glucose log. Sugars 90-140. She has been eating smaller portions.  No wounds that aren't healing well. Off her insulin.  On glipizide.    Hypothyroid- No skin or hair changes.  Just some bruising.  No weight changes.    COPD/Asthma - On spiriva and Advair.  Used albuterol twice yesterday.  Though using infrequently overall.  Heat and high humidity do seem to be triggers for her.  No exacerbation since I last saw her.   She did have carotid endarterectomy since I last saw her but is doing well. She has not been able to follow back up with cardiology because of transportation issues. She's currently relying on [employer] for transportation.  Chronic low back pain-she said she has had take her tramadol daily. She usually takes it first thing in the morning. If she still having significant pain by the end of the day she'll take a Tylenol PM at bedtime. She also complains of some joint pain in her hands and occasional pain in her knees.  Review of Systems     Objective:   Physical Exam  Constitutional: She is oriented to person, place, and time. She appears well-developed and well-nourished.  HENT:  Head: Normocephalic and atraumatic.  Cardiovascular: Normal rate, regular rhythm and normal heart sounds.   Pulmonary/Chest: Effort normal and breath sounds normal.  Musculoskeletal: She exhibits no edema.  Neurological: She is alert and oriented to person, place, and time.  Skin: Skin is warm and dry.  Psychiatric: She has a normal mood and affect. Her behavior is normal.          Assessment & Plan:  DM- WEll controlled.  A1C is 6.4.  She is due for her eye exam and I wrote remind her down for her. Hopefully she'll be getting a call from her eye doctor in the next month or 2 to schedule. She is on a statin, ASA. She is not on an ACE inhibitor but has not had any renal  disease. Continue current regimen. Followup in 3 months.  Hypothyroid - recheck TSH. She's been asymptomatic which is reassuring.  COPD - well controlled on current regimen of Advair and Spiriva. She still occasionally short of breath with activities. It has been a while since we have done a spirometry evaluation on her and encouraged her to schedule this in the next couple of months.  Chronic low back pain-no change in symptoms. Continue tramadol daily. Can use Tylenol as her backup therapy or to use on days where her pain is more mild.

## 2013-04-01 LAB — COMPLETE METABOLIC PANEL WITH GFR
ALT: 28 U/L (ref 0–35)
AST: 26 U/L (ref 0–37)
Creat: 1.32 mg/dL — ABNORMAL HIGH (ref 0.50–1.10)
Total Bilirubin: 0.4 mg/dL (ref 0.3–1.2)

## 2013-04-01 LAB — LIPID PANEL
Cholesterol: 176 mg/dL (ref 0–200)
Total CHOL/HDL Ratio: 3.5 Ratio
Triglycerides: 119 mg/dL (ref <150)
VLDL: 24 mg/dL (ref 0–40)

## 2013-04-06 ENCOUNTER — Other Ambulatory Visit: Payer: Self-pay | Admitting: Family Medicine

## 2013-05-18 ENCOUNTER — Other Ambulatory Visit: Payer: Self-pay | Admitting: Family Medicine

## 2013-05-20 ENCOUNTER — Telehealth: Payer: Self-pay | Admitting: *Deleted

## 2013-05-20 NOTE — Telephone Encounter (Signed)
Have anything change d ? As she felt sick or had any symptoms of a cold or urinary tract infection that might bump up her blood sugars? Or any major dietary changes? I believe she was well controlled when I saw her about 2 months ago just on glipizide without any insulin. Make sure to drink plenty of fluids to hydrate yourself and can give another 10 units of NovoLog at the following meal if needed.

## 2013-05-20 NOTE — Telephone Encounter (Signed)
States she is not feeling well and her BS this am was 205. Took Novolog 10 units. She ate an egg and toast and checked it an hour later and it was 253. She would like to know what else you think she needs to do. Please advise.  Meyer Cory, LPN

## 2013-05-21 NOTE — Telephone Encounter (Signed)
Appt made for Tuesday at 4pm so that daughter can bring her.  Meyer Cory, LPN

## 2013-05-21 NOTE — Telephone Encounter (Signed)
Let also see if we can get her in early next week.

## 2013-05-21 NOTE — Telephone Encounter (Signed)
Error

## 2013-05-21 NOTE — Telephone Encounter (Signed)
Error.Theresa Huang  

## 2013-05-21 NOTE — Telephone Encounter (Signed)
Pt's daughter called this morning and left a msg @ [phone] asking about her mom. I called her back and she stated that her bs was 353, they did a culture of her urine at the ED last night @ [facility] and they did not find anything. I told her that Dr. Linford Arnold said to make sure that she drink plenty of fluids to stay hydrated and can give another 10 units of Novolog at the following meal if needed. She stated that pt was asleep now however she would check this. Voiced understanding and agreed.Loralee Pacas [place]

## 2013-05-25 ENCOUNTER — Encounter: Payer: Self-pay | Admitting: Family Medicine

## 2013-05-25 ENCOUNTER — Ambulatory Visit (INDEPENDENT_AMBULATORY_CARE_PROVIDER_SITE_OTHER): Payer: Medicare HMO | Admitting: Family Medicine

## 2013-05-25 VITALS — BP 105/58 | HR 74 | Wt 155.0 lb

## 2013-05-25 DIAGNOSIS — R7301 Impaired fasting glucose: Secondary | ICD-10-CM

## 2013-05-25 DIAGNOSIS — E11329 Type 2 diabetes mellitus with mild nonproliferative diabetic retinopathy without macular edema: Secondary | ICD-10-CM

## 2013-05-25 DIAGNOSIS — E119 Type 2 diabetes mellitus without complications: Secondary | ICD-10-CM

## 2013-05-25 DIAGNOSIS — Z23 Encounter for immunization: Secondary | ICD-10-CM

## 2013-05-25 MED ORDER — INSULIN GLARGINE 100 UNIT/ML SOLOSTAR PEN: 5.0000 [IU] | PEN_INJECTOR | Freq: Every day | SUBCUTANEOUS | Status: DC

## 2013-05-25 NOTE — Progress Notes (Signed)
  Subjective:    Patient ID: Theresa Huang, female    DOB: 03/17/1931, 79 y.o.   MRN: [id number]  HPI DM- Sugars have been creeping up.  Went to ED last week for elevated sugars in the 200s-300s.  Hadn't had any sweets in about a week.  No appetite. They did  A UA - it was negative.  Had a CT scan of head and it was normal.  They did this because of balance issues.  Has been more forgetful than usual.    No cough or urinary sxs, CP or SOB. No other wounds or pains.   Would like another another copy of the sliding scale. She did bring in her glucose log. She's had some 140s and 160s which is unusual for her.  She says she also had a rash on her posterior right thigh. She thinks it was a reactivation of shingles. She says initially was itching and burning but now it's better. She's not sure if it's still there or not.  Reviewed ED notes from [facility]. Review of Systems     Objective:   Physical Exam  Constitutional: She is oriented to person, place, and time. She appears well-developed and well-nourished.  HENT:  Head: Normocephalic and atraumatic.  Wearing her hearing aids.   Cardiovascular: Normal rate, regular rhythm and normal heart sounds.   Pulmonary/Chest: Effort normal and breath sounds normal.  Abdominal: Soft. Bowel sounds are normal. She exhibits no distension and no mass. There is no tenderness. There is no rebound and no guarding.  Neurological: She is alert and oriented to person, place, and time.  Skin: Skin is warm and dry.  Psychiatric: She has a normal mood and affect. Her behavior is normal.   She has a smaller erythematous papule on the right posterior upper thigh with a scab in the center.       Assessment & Plan:  DM- A1c looks fantastic her sugars have been running a little bit higher. We will add back the Lantus 5 units at bedtime. We have stopped at one point because her sugars were so well controlled. Monitor for hypoglycemia. Sample pen given today.  Otherwise keep regular followup for diabetes next month.   Rash on right upper thigh, posterior-appears to be healing well. Doesn't have the correct distribution of shingles so also consider herpes simplex but it appears to be healing so no treatment given today.

## 2013-05-25 NOTE — Patient Instructions (Signed)
Please try to make an eye appointment.

## 2013-06-10 ENCOUNTER — Encounter: Payer: Self-pay | Admitting: Family Medicine

## 2013-06-14 ENCOUNTER — Other Ambulatory Visit: Payer: Self-pay | Admitting: *Deleted

## 2013-06-16 ENCOUNTER — Other Ambulatory Visit: Payer: Self-pay | Admitting: Family Medicine

## 2013-06-30 ENCOUNTER — Telehealth: Payer: Self-pay | Admitting: Family Medicine

## 2013-06-30 ENCOUNTER — Other Ambulatory Visit: Payer: Self-pay | Admitting: Family Medicine

## 2013-06-30 DIAGNOSIS — Z780 Asymptomatic menopausal state: Secondary | ICD-10-CM

## 2013-06-30 NOTE — Telephone Encounter (Signed)
Please call patient her monitor she is due for her diabetic followup this month. She is also overdue for bone density test so in place the order today and they should be contacting her next couple of days to schedule this.

## 2013-06-30 NOTE — Telephone Encounter (Signed)
Left detailed msg asking that pt's daughter call office to schedule appt.Theresa Huang

## 2013-07-06 ENCOUNTER — Encounter: Payer: Self-pay | Admitting: Family Medicine

## 2013-07-06 ENCOUNTER — Other Ambulatory Visit: Payer: Self-pay | Admitting: Family Medicine

## 2013-07-06 ENCOUNTER — Ambulatory Visit (INDEPENDENT_AMBULATORY_CARE_PROVIDER_SITE_OTHER): Payer: Medicare HMO

## 2013-07-06 DIAGNOSIS — Z780 Asymptomatic menopausal state: Secondary | ICD-10-CM

## 2013-07-06 DIAGNOSIS — M810 Age-related osteoporosis without current pathological fracture: Secondary | ICD-10-CM

## 2013-07-08 ENCOUNTER — Ambulatory Visit (INDEPENDENT_AMBULATORY_CARE_PROVIDER_SITE_OTHER): Payer: Medicare HMO | Admitting: Family Medicine

## 2013-07-08 ENCOUNTER — Encounter: Payer: Self-pay | Admitting: Family Medicine

## 2013-07-08 VITALS — BP 116/59 | HR 72 | Wt 165.0 lb

## 2013-07-08 DIAGNOSIS — E1059 Type 1 diabetes mellitus with other circulatory complications: Secondary | ICD-10-CM

## 2013-07-08 DIAGNOSIS — M810 Age-related osteoporosis without current pathological fracture: Secondary | ICD-10-CM

## 2013-07-08 DIAGNOSIS — F32A Depression, unspecified: Secondary | ICD-10-CM | POA: Insufficient documentation

## 2013-07-08 DIAGNOSIS — F329 Major depressive disorder, single episode, unspecified: Secondary | ICD-10-CM

## 2013-07-08 DIAGNOSIS — F3289 Other specified depressive episodes: Secondary | ICD-10-CM

## 2013-07-08 LAB — POCT GLYCOSYLATED HEMOGLOBIN (HGB A1C): Hemoglobin A1C: 6.5

## 2013-07-08 LAB — POCT UA - MICROALBUMIN: Albumin/Creatinine Ratio, Urine, POC: <30

## 2013-07-08 MED ORDER — AMBULATORY NON FORMULARY MEDICATION: Status: AC

## 2013-07-08 MED ORDER — ALENDRONATE SODIUM 70 MG PO TABS: 70.0000 mg | ORAL_TABLET | ORAL | Status: DC

## 2013-07-08 MED ORDER — SERTRALINE HCL 100 MG PO TABS: 150.0000 mg | ORAL_TABLET | Freq: Every day | ORAL | Status: DC

## 2013-07-08 NOTE — Progress Notes (Signed)
Subjective:    Patient ID: Theresa Huang, female    DOB: July 30, 1931, 79 y.o.   MRN: [id number]  HPI DM- she has been been testing 2x a day.  She skips her insulin when sugar is under 100. No hypoglycemic event. No wounds or sores that are not healing well.  Depression - has been feeling more down lately. She feels frustrated that she cannot get around to things like she used to. She does take sertraline 100 mg daily. They have had someone living with them for several weeks. He was supposed to move out after about 2 weeks but still there and that has been frustrating as well. Her daughter is here with her today. Daughter recently got married so has not been around as much. She says that she does feel down almost every day. She also complains of fatigue and over eating and feeling bad about herself on this every day.  She would also like to go over her bone density results.  Review of Systems  BP 116/59  Pulse 72  Wt 165 lb (74.844 kg)  BMI 29.24 kg/m2    Allergies  Allergen Reactions  . Tetracycline     REACTION: arms swelling, itching, blisters    Past Medical History  Diagnosis Date  . DDD (degenerative disc disease)   . Inflammatory polyps of colon with rectal bleeding   . Hemorrhoids   . Retinopathy of left eye   . Hearing loss of both ears     wears bilateral hearing aids  . Glaucoma   . Diastolic dysfunction   . Hyperlipidemia   . Hypothyroid   . Anemia   . Irregular heart beat   . Cancer     SCC  . Holter monitor, abnormal     wearing for 21 days  . Syncope   . Myocardial infarction 2006  . Pneumonia     hx  . Shortness of breath     most of time  . COPD (chronic obstructive pulmonary disease)     wears oxygen at 3L prn daytime and 3L at night   . Diabetes mellitus     fasting blood sugar 80-100  . Headache(784.0)     migranes  . Dizziness   . Carotid artery occlusion     Past Surgical History  Procedure Laterality Date  . Squamous cell removed     . Eye surgery      cataracts  . Hemorrhoid surgery    . Abdominal hysterectomy    . Throat surgery    . Tonsillectomy    . Colonoscopy w/ polypectomy    . Endarterectomy  09/01/2012    Procedure: ENDARTERECTOMY CAROTID;  Surgeon: Fransisco Hertz, MD;  Location: [facility] OR;  Service: Vascular;  Laterality: Right;  . Patch angioplasty  09/01/2012    Procedure: PATCH ANGIOPLASTY;  Surgeon: Fransisco Hertz, MD;  Location: [facility] OR;  Service: Vascular;  Laterality: Right;  Vascu-Guard Patch Angioplasty  . Carotid endarterectomy      History   Social History  . Marital Status: Widowed    Spouse Name: N/A    Number of Children: 4  . Years of Education: N/A   Occupational History  .     Social History Main Topics  . Smoking status: Former Smoker    Types: Cigarettes    Quit date: 09/17/1987  . Smokeless tobacco: Never Used  . Alcohol Use: No  . Drug Use: No  . Sexual Activity:  Other Topics Concern  . Not on file   Social History Narrative  . No narrative on file    Family History  Problem Relation Age of Onset  . Depression Mother   . Diabetes Mother   . CAD Mother   . Heart disease Mother   . Hyperlipidemia Daughter   . Cancer Daughter   . CAD Father   . Heart disease Father   . Diabetes Brother   . Hypertension Son     Outpatient Encounter Prescriptions as of 07/08/2013  Medication Sig Dispense Refill  . acetaminophen (TYLENOL ARTHRITIS PAIN) 650 MG CR tablet Take 650 mg by mouth every 8 (eight) hours as needed. For pain      . ADVAIR DISKUS 250-50 MCG/DOSE AEPB INHALE 1 PUFF TWICE DAILY  60 each  1  . albuterol (PROVENTIL) (2.5 MG/3ML) 0.083% nebulizer solution Take 2.5 mg by nebulization every 4 (four) hours as needed. For shortness of breath      . AMBULATORY NON FORMULARY MEDICATION Medication Name: Home continuous oxygen.  3 liters. Please provide portable prn. Dx: COPD  1 Device  prn  . AMBULATORY NON FORMULARY MEDICATION Portable oxygen for travel.   Diagnosis:  COPD Rate of 3 liters per minute  1 each  0  . aspirin 81 MG tablet Take 81 mg by mouth daily.        . Calcium-Vitamin D (CALTRATE 600 PLUS-VIT D PO) Take 1 tablet by mouth daily.       [name] GLIPIZIDE XL 10 MG 24 hr tablet TAKE ONE TABLET BY MOUTH EVERY DAY  30 tablet  2  . Insulin Glargine (LANTUS SOLOSTAR) 100 UNIT/ML SOPN Inject 5 Units into the skin at bedtime.  6 mL  6  . Insulin Pen Needle (PEN NEEDLES 29GX1/2") 29G X MISC       . ipratropium (ATROVENT HFA) 17 MCG/ACT inhaler Inhale 2 puffs into the lungs 4 (four) times daily.       [name] loratadine (CLARITIN) 10 MG tablet TAKE ONE TABLET BY MOUTH EVERY DAY  30 tablet  1  . Multiple Vitamin (MULTIVITAMIN) tablet Take 1 tablet by mouth daily.        . nitroGLYCERIN (NITROSTAT) 0.4 MG SL tablet Place 1 tablet (0.4 mg total) under the tongue every 5 (five) minutes as needed. For chest pain  30 tablet  0  . PROAIR HFA 108 (90 BASE) MCG/ACT inhaler INHALE 2 PUFFS INTO THE LUNGS EVERY 4 HOURS AS NEEDED FOR WHEEZING  8.5 g  2  . QC LANCETS SUPER THIN MISC TEST TWICE DAILY  100 each  1  . sertraline (ZOLOFT) 100 MG tablet Take 1.5 tablets (150 mg total) by mouth daily.  45 tablet  2  . simvastatin (ZOCOR) 40 MG tablet TAKE ONE TABLET BY MOUTH AT BEDTIME  30 tablet  2  . SPIRIVA HANDIHALER 18 MCG inhalation capsule INHALE 1 CAPSULE BY MOUTH DAILY  30 capsule  1  . SYNTHROID 50 MCG tablet TAKE ONE TABLET BY MOUTH EVERY DAY  30 tablet  2  . traMADol (ULTRAM) 50 MG tablet TAKE ONE TABLET BY MOUTH EVERY DAY AS NEEDED FOR PAIN  30 tablet  0  . TRUETRACK TEST test strip USE AS DIRECTED (TEST TWICE DAILY)  50 each  5  . [DISCONTINUED] sertraline (ZOLOFT) 100 MG tablet TAKE ONE TABLET BY MOUTH EVERY DAY  30 tablet  1  . alendronate (FOSAMAX) 70 MG tablet Take 1 tablet (70 mg total)  by mouth every 7 (seven) days. Take with a full glass of water on an empty stomach.  4 tablet  11  . AMBULATORY NON FORMULARY MEDICATION Medication Name: glucometer strips to  test BID.  Dx. 250.00  100 Units  PRN   No facility-administered encounter medications on file as of 07/08/2013.          Objective:   Physical Exam  Constitutional: She is oriented to person, place, and time. She appears well-developed and well-nourished.  HENT:  Head: Normocephalic and atraumatic.  Cardiovascular: Normal rate, regular rhythm and normal heart sounds.   Pulmonary/Chest: Effort normal and breath sounds normal.  Neurological: She is alert and oriented to person, place, and time.  Skin: Skin is warm and dry.  Psychiatric: She has a normal mood and affect. Her behavior is normal.          Assessment & Plan:  DM - A1C is 6.3, well controlled. If you start having frequently lows then we need to adjust the medications. Followup in 3 months. Reminded her again to try to get her eye exam. She still has not had a chance to do that. She relies on her daughter to drive her.  Osteoporosis - discussed new dx.  we discussed checking vitamin D levels to make sure they're adequate. She does take a regular calcium vitamin D supplement. We will also start her on a bisphosphonate. Will send her for generic Fosamax. She has any problems please let me know. She will need to sit upright for it 2 hours after taking the medication and not lie back down. If she gets GI upset or irritation and please let me know. Repeat scan in 2 years.  Depression - uncontrolled.  Will inc sertraline to 150mg  daily. F/U in 6 weeks. PHQ - 9 scor eof 15 today.

## 2013-07-08 NOTE — Addendum Note (Signed)
Addended by: Deno Etienne on: 07/08/2013 03:10 PM   Modules accepted: Orders

## 2013-07-09 LAB — VITAMIN D 25 HYDROXY (VIT D DEFICIENCY, FRACTURES): Vit D, 25-Hydroxy: 34 ng/mL (ref 30–89)

## 2013-07-16 ENCOUNTER — Other Ambulatory Visit: Payer: Self-pay | Admitting: Family Medicine

## 2013-08-02 ENCOUNTER — Other Ambulatory Visit: Payer: Self-pay | Admitting: Family Medicine

## 2013-08-16 ENCOUNTER — Other Ambulatory Visit: Payer: Self-pay | Admitting: Family Medicine

## 2013-08-19 ENCOUNTER — Other Ambulatory Visit: Payer: Self-pay | Admitting: *Deleted

## 2013-09-13 ENCOUNTER — Other Ambulatory Visit: Payer: Self-pay | Admitting: Family Medicine

## 2013-09-15 ENCOUNTER — Other Ambulatory Visit: Payer: Self-pay | Admitting: Family Medicine

## 2013-09-16 ENCOUNTER — Other Ambulatory Visit: Payer: Self-pay | Admitting: Family Medicine

## 2013-09-17 ENCOUNTER — Other Ambulatory Visit: Payer: Self-pay | Admitting: *Deleted

## 2013-09-17 MED ORDER — TRAMADOL HCL 50 MG PO TABS: ORAL_TABLET | ORAL | Status: DC

## 2013-09-20 ENCOUNTER — Telehealth: Payer: Self-pay | Admitting: *Deleted

## 2013-09-20 NOTE — Telephone Encounter (Signed)
Pt called and would like to schedule an appt w/ Dr. Madilyn Fireman. Please call her back to schedule.Theresa Huang, [place]ahoma Crocker

## 2013-09-28 ENCOUNTER — Encounter: Payer: Self-pay | Admitting: Family Medicine

## 2013-09-28 ENCOUNTER — Ambulatory Visit (INDEPENDENT_AMBULATORY_CARE_PROVIDER_SITE_OTHER): Payer: Commercial Managed Care - HMO | Admitting: Family Medicine

## 2013-09-28 VITALS — BP 117/56 | HR 76 | Wt 171.0 lb

## 2013-09-28 DIAGNOSIS — J449 Chronic obstructive pulmonary disease, unspecified: Secondary | ICD-10-CM

## 2013-09-28 DIAGNOSIS — E039 Hypothyroidism, unspecified: Secondary | ICD-10-CM

## 2013-09-28 DIAGNOSIS — E119 Type 2 diabetes mellitus without complications: Secondary | ICD-10-CM

## 2013-09-28 LAB — POCT GLYCOSYLATED HEMOGLOBIN (HGB A1C): Hemoglobin A1C: 7

## 2013-09-28 MED ORDER — SERTRALINE HCL 100 MG PO TABS: 150.0000 mg | ORAL_TABLET | Freq: Every day | ORAL | Status: DC

## 2013-09-28 MED ORDER — TIOTROPIUM BROMIDE MONOHYDRATE 18 MCG IN CAPS: ORAL_CAPSULE | RESPIRATORY_TRACT | Status: DC

## 2013-09-28 MED ORDER — SYNTHROID 50 MCG PO TABS: ORAL_TABLET | ORAL | Status: DC

## 2013-09-28 MED ORDER — FLUTICASONE-SALMETEROL 250-50 MCG/DOSE IN AEPB: INHALATION_SPRAY | RESPIRATORY_TRACT | Status: DC

## 2013-09-28 MED ORDER — SIMVASTATIN 40 MG PO TABS: ORAL_TABLET | ORAL | Status: DC

## 2013-09-28 NOTE — Progress Notes (Signed)
   Subjective:    Patient ID: Theresa Huang, female    DOB: 1930-12-08, 80 y.o.   MRN: [id number]  HPI Diabetes - no hypoglycemic events. No wounds or sores that are not healing well. No increased thirst or urination. Checking glucose at home. Taking medications as prescribed without any side effects.  Fasting sugars have been running in the 80-90s.  Has felt fatigued.  Her skin is dry.    COPD - well controlled.  She says using her Advair but according to our Rx log she should have run out 2 months ago. Will refill today.    CKD 3- no recent changes.   Hypothyroidism-she has gained about 5 pounds since I last saw her. She also says that her skin has been very dry. She's been trying to moisturize it hasn't really helped. She does complain of fatigue. No cold intolerance. No recent changes to her regimen. She says she's taking her thyroid medication regularly.  She does have some skin lesions on her back. She would like me to look at them today. She says sometimes they're itchy and bothersome. She says sometimes she is able to peel them off. Review of Systems     Objective:   Physical Exam  Constitutional: She is oriented to person, place, and time. She appears well-developed and well-nourished.  HENT:  Head: Normocephalic and atraumatic.  Cardiovascular: Normal rate, regular rhythm and normal heart sounds.   Pulmonary/Chest: Effort normal and breath sounds normal.  Neurological: She is alert and oriented to person, place, and time.  Skin: Skin is warm and dry.  On her back she has some scattered Sherri angiomas as well as some seborrheic keratoses. She has a large blackhead near her bra strap.  Psychiatric: She has a normal mood and affect. Her behavior is normal.          Assessment & Plan:  Diabetes - well controlled.  F/U in 3 months.  Refills sent today.   Hypothyroid - Dry skin and weight gain - Recheck thyroid.  Adjust dose as needed.  Lab Results  Component Value Date    TSH 3.750 04/01/2013   COPD - well controlled.  No recent flares.  Updated med list. Refilled Advair.   CKD 3- recheck BUN/Cr today.    Seborrheic her disease- Her that these are typically benign in her diet. They can become itchy at times at the rub on clothing. He can also be peeled off but usually come right back. This can certainly be frozen for treatment if she would like.

## 2013-09-28 NOTE — Patient Instructions (Signed)
Remember to schedule your eye exam. I recommend Dr. Hoyle Sauer here and [facility].

## 2013-10-15 ENCOUNTER — Other Ambulatory Visit: Payer: Self-pay | Admitting: Family Medicine

## 2013-11-12 ENCOUNTER — Encounter: Payer: Self-pay | Admitting: Family Medicine

## 2013-11-12 ENCOUNTER — Ambulatory Visit (INDEPENDENT_AMBULATORY_CARE_PROVIDER_SITE_OTHER): Payer: Medicare HMO | Admitting: Family Medicine

## 2013-11-12 VITALS — BP 109/57 | HR 81 | Temp 97.9°F | Wt 169.0 lb

## 2013-11-12 DIAGNOSIS — H6120 Impacted cerumen, unspecified ear: Secondary | ICD-10-CM

## 2013-11-12 DIAGNOSIS — H6121 Impacted cerumen, right ear: Secondary | ICD-10-CM

## 2013-11-12 DIAGNOSIS — H60399 Other infective otitis externa, unspecified ear: Secondary | ICD-10-CM

## 2013-11-12 MED ORDER — NEOMYCIN-POLYMYXIN-HC 1 % OT SOLN: OTIC | Status: AC

## 2013-11-12 NOTE — Progress Notes (Signed)
CC: Theresa Huang is a 80 y.o. female is here for right ear pain   Subjective: HPI:  Complains of right ear pain described only as pain moderate in severity comes and goes throughout the day. Worse with swallowing however does not hurt every time she swallows. Has been present on a daily basis for the last 3 weeks has not been getting better or worsens onset. She's using an over-the-counter ear drop without much benefit of symptoms. Denies manipulation of ear or canal recently or remotely. She reports chronic hearing loss but no change recently. Denies dizziness, headache, nasal congestion, cough, sinus pressure.   Review Of Systems Outlined In HPI  Past Medical History  Diagnosis Date  . DDD (degenerative disc disease)   . Inflammatory polyps of colon with rectal bleeding   . Hemorrhoids   . Retinopathy of left eye   . Hearing loss of both ears     wears bilateral hearing aids  . Glaucoma   . Diastolic dysfunction   . Hyperlipidemia   . Hypothyroid   . Anemia   . Irregular heart beat   . Cancer     SCC  . Holter monitor, abnormal     wearing for 21 days  . Syncope   . Myocardial infarction 2006  . Pneumonia     hx  . Shortness of breath     most of time  . COPD (chronic obstructive pulmonary disease)     wears oxygen at 3L prn daytime and 3L at night   . Diabetes mellitus     fasting blood sugar 80-100  . Headache(784.0)     migranes  . Dizziness   . Carotid artery occlusion     Past Surgical History  Procedure Laterality Date  . Squamous cell removed    . Eye surgery      cataracts  . Hemorrhoid surgery    . Abdominal hysterectomy    . Throat surgery    . Tonsillectomy    . Colonoscopy w/ polypectomy    . Endarterectomy  09/01/2012    Procedure: ENDARTERECTOMY CAROTID;  Surgeon: Conrad Pomeroy, MD;  Location: [facility];  Service: Vascular;  Laterality: Right;  . Patch angioplasty  09/01/2012    Procedure: PATCH ANGIOPLASTY;  Surgeon: Conrad [place], MD;   Location: [facility];  Service: Vascular;  Laterality: Right;  Vascu-Guard Patch Angioplasty  . Carotid endarterectomy     Family History  Problem Relation Age of Onset  . Depression Mother   . Diabetes Mother   . CAD Mother   . Heart disease Mother   . Hyperlipidemia Daughter   . Cancer Daughter   . CAD Father   . Heart disease Father   . Diabetes Brother   . Hypertension Son     History   Social History  . Marital Status: Widowed    Spouse Name: N/A    Number of Children: 4  . Years of Education: N/A   Occupational History  .     Social History Main Topics  . Smoking status: Former Smoker    Types: Cigarettes    Quit date: 09/17/1987  . Smokeless tobacco: Never Used  . Alcohol Use: No  . Drug Use: No  . Sexual Activity:    Other Topics Concern  . Not on file   Social History Narrative  . No narrative on file     Objective: BP 109/57  Pulse 81  Temp(Src) 97.9 F (36.6 C) (Oral)  Wt 169 lb (76.658 kg)   General: Alert and Oriented, No Acute Distress HEENT: Pupils equal, round, reactive to light. Conjunctivae clear.  Left external ear unremarkable, left canal clear with intact TMs with appropriate landmarks.  Left Middle ear appears open without effusion. Initial exam of the right ear shows a cerumen impaction after removal there is mild edema and erythema in the proximal ear canal with an unremarkable tympanic membrane with good landmarks and a open middle ear. Pink inferior turbinates.  Moist mucous membranes, pharynx without inflammation nor lesions.  Neck supple without palpable lymphadenopathy nor abnormal masses. Lungs: Clear to auscultation bilaterally, no wheezing/ronchi/rales.  Comfortable work of breathing. Good air movement. Extremities: No peripheral edema.  Strong peripheral pulses.  Mental Status: No depression, anxiety, nor agitation. Skin: Warm and dry.  Assessment & Plan: Theresa Huang was seen today for right ear pain.  Diagnoses and associated orders  for this visit:  Right ear impacted cerumen  Otitis, externa, infective - NEOMYCIN-POLYMYXIN-HYDROCORTISONE (CORTISPORIN) 1 % SOLN otic solution; Four drops in affected ear(s) three times a day, keep in ear(s) for five minutes. Total of ten days.    Indication: Cerumen impaction of the right ear Medical necessity statement: On physical examination, cerumen impairs clinically significant portions of the external auditory canal, and tympanic membrane. Noted obstructive, copious cerumen that cannot be removed without magnification and instrumentations requiring physician skills Consent: Discussed benefits and risks of procedure and verbal consent obtained Procedure: Patient was prepped for the procedure. Utilized an otoscope to assess and take note of the ear canal, the tympanic membrane, and the presence, amount, and placement of the cerumen. Gentle water irrigation and soft plastic curette was utilized to remove cerumen.  Post procedure examination: shows cerumen was completely removed. Patient tolerated procedure well. The patient is made aware that they may experience temporary vertigo, temporary hearing loss, and temporary discomfort. If these symptom last for more than 24 hours to call the clinic or proceed to the ED.   Pain was improved after removal of cerumen there is evidence of a mild otitis externa therefore start Cortisporin ear drops for the next 10 days   Return if symptoms worsen or fail to improve.

## 2013-11-15 ENCOUNTER — Other Ambulatory Visit: Payer: Self-pay | Admitting: Family Medicine

## 2013-12-14 ENCOUNTER — Other Ambulatory Visit: Payer: Self-pay | Admitting: Family Medicine

## 2013-12-27 ENCOUNTER — Telehealth: Payer: Self-pay | Admitting: *Deleted

## 2013-12-27 ENCOUNTER — Ambulatory Visit (INDEPENDENT_AMBULATORY_CARE_PROVIDER_SITE_OTHER): Payer: Medicare HMO | Admitting: Family Medicine

## 2013-12-27 ENCOUNTER — Other Ambulatory Visit: Payer: Self-pay | Admitting: Family Medicine

## 2013-12-27 ENCOUNTER — Encounter: Payer: Self-pay | Admitting: Family Medicine

## 2013-12-27 VITALS — BP 136/64 | HR 73 | Wt 167.0 lb

## 2013-12-27 DIAGNOSIS — N183 Chronic kidney disease, stage 3 unspecified: Secondary | ICD-10-CM

## 2013-12-27 DIAGNOSIS — E039 Hypothyroidism, unspecified: Secondary | ICD-10-CM

## 2013-12-27 DIAGNOSIS — E119 Type 2 diabetes mellitus without complications: Secondary | ICD-10-CM

## 2013-12-27 DIAGNOSIS — H6120 Impacted cerumen, unspecified ear: Secondary | ICD-10-CM

## 2013-12-27 DIAGNOSIS — H9190 Unspecified hearing loss, unspecified ear: Secondary | ICD-10-CM

## 2013-12-27 DIAGNOSIS — L570 Actinic keratosis: Secondary | ICD-10-CM

## 2013-12-27 DIAGNOSIS — M26629 Arthralgia of temporomandibular joint, unspecified side: Secondary | ICD-10-CM

## 2013-12-27 LAB — POCT UA - MICROALBUMIN
Creatinine, POC: 100 mg/dL
Microalbumin Ur, POC: 10 mg/L

## 2013-12-27 LAB — POCT GLYCOSYLATED HEMOGLOBIN (HGB A1C): HEMOGLOBIN A1C: 6.4

## 2013-12-27 MED ORDER — NAPROXEN 500 MG PO TABS: 500.0000 mg | ORAL_TABLET | Freq: Two times a day (BID) | ORAL | Status: DC

## 2013-12-27 MED ORDER — IMIQUIMOD 3.75 % EX CREA: TOPICAL_CREAM | CUTANEOUS | Status: DC

## 2013-12-27 NOTE — Progress Notes (Signed)
Subjective:    Patient ID: Theresa Huang, female    DOB: [birth year], 80 y.o.   MRN: [id number]  HPI Diabetes - no hypoglycemic events. No wounds or sores that are not healing well. No increased thirst or urination. Checking glucose at home. Taking medications as prescribed without any side effects.  Rash on face. Thought initally blackheads.  He says they're slightly red and crusty like dry skin but she has been moisturizing and moisturizing them they do not seem to go away. A couple that she's picked and scratched out.  Skin lesion on her hip that it itchey and bothers her.  She also has one near her bra strap she would like me to look at today. She would like me to take them off if at all possible. She says she really can't see them very well.  Right ear aches for about 2 months. Saw Dr. Ileene Rubens and feels bettre some but not resolved.  Says some pain radiating into her right neck.  No fever, chills or sweats.   Hypothyroidism-no recent skin or hair changes, except for the new lesions on her face as described above.  Hearing loss - she has broken her hearing aids and says they are very expensive to replacement. Tried to see if they could be repaired. But unfortunately they are too old to repair. Review of Systems     Objective:   Physical Exam  Constitutional: She is oriented to person, place, and time. She appears well-developed and well-nourished.  HENT:  Head: Normocephalic and atraumatic.  Neck: Neck supple. No thyromegaly present.  Tender tender over the right temporomandibular joint. No popping and clicking of the joint itself.  Cardiovascular: Normal rate, regular rhythm and normal heart sounds.   Pulmonary/Chest: Effort normal and breath sounds normal.  Lymphadenopathy:    She has no cervical adenopathy.  Neurological: She is alert and oriented to person, place, and time.  Skin: Skin is warm and dry. Rash noted.  A few scattered actinic keratoses on her face. He has what  looks like an inflamed sebaceous cyst fairly small near her bra strap. The lesion on her right lower hip at her waistband is a normal-appearing mole but because it so protuberant it's probably getting stuck and irritated with her clothing.  Psychiatric: She has a normal mood and affect. Her behavior is normal.          Assessment & Plan:  Diabetes -  Well controlled.  On zocor, ASA. Continue current regimen. Followup in 3-4 months. Reminded her to get up-to-date eye exam since she is overdue. Lab Results  Component Value Date   HGBA1C 6.4 12/27/2013   Right TMJ - I. think her right ear pain is actually coming from her TMJ joint. Most of her pain is just anterior to the ear and she's tender over the joint itself. We'll provide a handout with additional information for education. Also recommend Naprosyn up to twice a day as needed. Take with food and water. Stop immediately if any GI upset or irritation.  Left ear, cerumen impaction-irrigation performed today.   Hypothyroidism-due to recheck TSH. Lab Results  Component Value Date   TSH 3.750 04/01/2013   Actinic keratoses-gave reassurance. Recommend treatment with him that although topical. Apply to affected area once daily for 2 weeks initially and then 2 weeks with no treatment. The repeat again in 2 weeks. This should be done for total of 16 weeks. Apply at bedtime and rinse off in the morning.  CKD 3 - due to repeat CMP today.

## 2013-12-27 NOTE — Telephone Encounter (Signed)
Spoke w/melody informed that it would be ok to switch to aldara 5%.Teddy Spike

## 2013-12-27 NOTE — Patient Instructions (Addendum)
Temporomandibular Problems  Temporomandibular joint (TMJ) dysfunction means there are problems with the joint between your jaw and your skull. This is a joint lined by cartilage like other joints in your body but also has a small disc in the joint which keeps the bones from rubbing on each other. These joints are like other joints and can get inflamed (sore) from arthritis and other problems. When this joint gets sore, it can cause headaches and pain in the jaw and the face. CAUSES  Usually the arthritic types of problems are caused by soreness in the joint. Soreness in the joint can also be caused by overuse. This may come from grinding your teeth. It may also come from mis-alignment in the joint. DIAGNOSIS Diagnosis of this condition can often be made by history and exam. Sometimes your caregiver may need X-rays or an MRI scan to determine the exact cause. It may be necessary to see your dentist to determine if your teeth and jaws are lined up correctly. TREATMENT  Most of the time this problem is not serious; however, sometimes it can persist (become chronic). When this happens medications that will cut down on inflammation (soreness) help. Sometimes a shot of cortisone into the joint will be helpful. If your teeth are not aligned it may help for your dentist to make a splint for your mouth that can help this problem. If no physical problems can be found, the problem may come from tension. If tension is found to be the cause, biofeedback or relaxation techniques may be helpful. HOME CARE INSTRUCTIONS   Later in the day, applications of ice packs may be helpful. Ice can be used in a plastic bag with a towel around it to prevent frostbite to skin. This may be used about every 2 hours for 20 to 30 minutes, as needed while awake, or as directed by your caregiver.  Only take over-the-counter or prescription medicines for pain, discomfort, or fever as directed by your caregiver.  If physical therapy was  prescribed, follow your caregiver's directions.  Wear mouth appliances as directed if they were given. Document Released: 05/28/2001 Document Revised: 11/25/2011 Document Reviewed: 09/04/2008 [facility] Patient Information 2014 [place], [place].     Sent over a prescription for imiquimod topical cream to apply this at bedtime and rinse off in the morning. Do this daily for 2 weeks. Then take a break for 2 weeks. Then a repeat again for 2 weeks. Then take a break for 2 weeks. He will do this for a total of 16 weeks, 4 months. This is to treat the actinic keratoses, red crusty spots, on her face.  Also, make sure you get your diabetic eye exam.

## 2013-12-28 LAB — BASIC METABOLIC PANEL WITH GFR
BUN: 27 mg/dL — ABNORMAL HIGH (ref 6–23)
CO2: 27 mEq/L (ref 19–32)
Calcium: 9.3 mg/dL (ref 8.4–10.5)
Chloride: 101 mEq/L (ref 96–112)
Creat: 1.28 mg/dL — ABNORMAL HIGH (ref 0.50–1.10)
GFR, EST AFRICAN AMERICAN: 46 mL/min — AB
GFR, EST NON AFRICAN AMERICAN: 40 mL/min — AB
GLUCOSE: 134 mg/dL — AB (ref 70–99)
Potassium: 4.2 mEq/L (ref 3.5–5.3)
SODIUM: 138 meq/L (ref 135–145)

## 2013-12-28 LAB — TSH: TSH: 3.541 u[IU]/mL (ref 0.350–4.500)

## 2014-01-14 ENCOUNTER — Other Ambulatory Visit: Payer: Self-pay | Admitting: Family Medicine

## 2014-02-14 ENCOUNTER — Other Ambulatory Visit: Payer: Self-pay | Admitting: Family Medicine

## 2014-03-16 ENCOUNTER — Other Ambulatory Visit: Payer: Self-pay | Admitting: Family Medicine

## 2014-04-14 ENCOUNTER — Other Ambulatory Visit: Payer: Self-pay | Admitting: Family Medicine

## 2014-05-10 ENCOUNTER — Other Ambulatory Visit: Payer: Self-pay | Admitting: Family Medicine

## 2014-05-16 ENCOUNTER — Other Ambulatory Visit: Payer: Self-pay | Admitting: Family Medicine

## 2014-06-14 ENCOUNTER — Other Ambulatory Visit: Payer: Self-pay | Admitting: Family Medicine

## 2014-07-12 ENCOUNTER — Encounter: Payer: Self-pay | Admitting: Family Medicine

## 2014-07-12 ENCOUNTER — Ambulatory Visit (INDEPENDENT_AMBULATORY_CARE_PROVIDER_SITE_OTHER): Payer: Medicare HMO | Admitting: Family Medicine

## 2014-07-12 VITALS — BP 125/65 | HR 73 | Temp 97.8°F | Wt 164.0 lb

## 2014-07-12 DIAGNOSIS — G5690 Unspecified mononeuropathy of unspecified upper limb: Secondary | ICD-10-CM

## 2014-07-12 DIAGNOSIS — E119 Type 2 diabetes mellitus without complications: Secondary | ICD-10-CM

## 2014-07-12 DIAGNOSIS — L299 Pruritus, unspecified: Secondary | ICD-10-CM

## 2014-07-12 DIAGNOSIS — E038 Other specified hypothyroidism: Secondary | ICD-10-CM

## 2014-07-12 DIAGNOSIS — Z23 Encounter for immunization: Secondary | ICD-10-CM

## 2014-07-12 LAB — POCT GLYCOSYLATED HEMOGLOBIN (HGB A1C): Hemoglobin A1C: 6.2

## 2014-07-12 NOTE — Progress Notes (Signed)
   Subjective:    Patient ID: Theresa Huang, female    DOB: [birth year], 80 y.o.   MRN: [id number]  HPI Itching and burnig in both hands for about a year. Usually last about 5- 10 minutes. Starts at fingertips and up to her elbows.  No numbness. No rash. Has been worse the last few months. No numbness or tingling. No weakness. ues aspercreme and helps some.  She denies any swelling of the hands when this happens.  Diabetes - no hypoglycemic events. No wounds or sores that are not healing well. No increased thirst or urination. Checking glucose at home. Most have been well controlled,, but did have some highs when he she was out of town. Taking medications as prescribed without any side effects.  Hypothyroid - no recent skin or hair changes. No weight changes.   Review of Systems     Objective:   Physical Exam  Constitutional: She is oriented to person, place, and time. She appears well-developed and well-nourished.  HENT:  Head: Normocephalic and atraumatic.  Cardiovascular: Normal rate, regular rhythm and normal heart sounds.   Pulmonary/Chest: Effort normal and breath sounds normal.  Musculoskeletal:  Ration arms-appear to be normal. No erythema or rash or dry skin. No scaling. Normal range of motion. No swelling of the joints or digits.  Neurological: She is alert and oriented to person, place, and time.  Skin: Skin is warm and dry.  Psychiatric: She has a normal mood and affect. Her behavior is normal.          Assessment & Plan:  Hand itching - will check B12 and liver enzymes. Will check for elevated hemoglobin.  Unclear etiology but certainly could be a some type of neuropathy. Or could be caused by elevated liver enzymes. The last time we checked her liver was over a year ago.  DM - well controlled. F/U in 3 months.  Continue current regimen. Hypothyroid - due to recheck levels. Will cal with results.   Hypothyroid - stable on her current regimen. We will recheck lab  work today will call with results once available.

## 2014-07-13 ENCOUNTER — Other Ambulatory Visit: Payer: Self-pay | Admitting: Family Medicine

## 2014-07-13 DIAGNOSIS — L299 Pruritus, unspecified: Secondary | ICD-10-CM

## 2014-07-13 LAB — COMPLETE METABOLIC PANEL WITH GFR
ALBUMIN: 3.9 g/dL (ref 3.5–5.2)
ALT: 19 U/L (ref 0–35)
AST: 22 U/L (ref 0–37)
Alkaline Phosphatase: 68 U/L (ref 39–117)
BUN: 27 mg/dL — ABNORMAL HIGH (ref 6–23)
CALCIUM: 9.5 mg/dL (ref 8.4–10.5)
CHLORIDE: 103 meq/L (ref 96–112)
CO2: 31 mEq/L (ref 19–32)
Creat: 1.16 mg/dL — ABNORMAL HIGH (ref 0.50–1.10)
GFR, Est African American: 51 mL/min — ABNORMAL LOW
GFR, Est Non African American: 44 mL/min — ABNORMAL LOW
Glucose, Bld: 70 mg/dL (ref 70–99)
POTASSIUM: 4.3 meq/L (ref 3.5–5.3)
Sodium: 141 mEq/L (ref 135–145)
TOTAL PROTEIN: 6.8 g/dL (ref 6.0–8.3)
Total Bilirubin: 0.4 mg/dL (ref 0.2–1.2)

## 2014-07-13 LAB — CBC WITH DIFFERENTIAL/PLATELET
BASOS ABS: 0 10*3/uL (ref 0.0–0.1)
Basophils Relative: 0 % (ref 0–1)
EOS ABS: 0.2 10*3/uL (ref 0.0–0.7)
Eosinophils Relative: 3 % (ref 0–5)
HCT: 40.6 % (ref 36.0–46.0)
Hemoglobin: 13.2 g/dL (ref 12.0–15.0)
LYMPHS ABS: 1.4 10*3/uL (ref 0.7–4.0)
Lymphocytes Relative: 22 % (ref 12–46)
MCH: 30.1 pg (ref 26.0–34.0)
MCHC: 32.5 g/dL (ref 30.0–36.0)
MCV: 92.5 fL (ref 78.0–100.0)
Monocytes Absolute: 0.5 10*3/uL (ref 0.1–1.0)
Monocytes Relative: 7 % (ref 3–12)
Neutro Abs: 4.4 10*3/uL (ref 1.7–7.7)
Neutrophils Relative %: 68 % (ref 43–77)
Platelets: 188 10*3/uL (ref 150–400)
RBC: 4.39 MIL/uL (ref 3.87–5.11)
RDW: 13 % (ref 11.5–15.5)
WBC: 6.5 10*3/uL (ref 4.0–10.5)

## 2014-07-13 LAB — VITAMIN B12: Vitamin B-12: 573 pg/mL (ref 211–911)

## 2014-07-13 LAB — FOLATE

## 2014-07-13 LAB — FERRITIN: Ferritin: 79 ng/mL (ref 10–291)

## 2014-07-13 LAB — SEDIMENTATION RATE: Sed Rate: 14 mm/hr (ref 0–22)

## 2014-07-13 LAB — TSH: TSH: 3.527 u[IU]/mL (ref 0.350–4.500)

## 2014-07-15 ENCOUNTER — Encounter: Payer: Medicare HMO | Admitting: Family Medicine

## 2014-07-19 ENCOUNTER — Other Ambulatory Visit: Payer: Self-pay | Admitting: Family Medicine

## 2014-09-01 ENCOUNTER — Other Ambulatory Visit: Payer: Self-pay | Admitting: Family Medicine

## 2014-09-12 ENCOUNTER — Other Ambulatory Visit: Payer: Self-pay | Admitting: Family Medicine

## 2014-10-08 DIAGNOSIS — J45909 Unspecified asthma, uncomplicated: Secondary | ICD-10-CM | POA: Diagnosis not present

## 2014-10-08 DIAGNOSIS — Q348 Other specified congenital malformations of respiratory system: Secondary | ICD-10-CM | POA: Diagnosis not present

## 2014-10-10 ENCOUNTER — Other Ambulatory Visit: Payer: Self-pay | Admitting: Family Medicine

## 2014-10-11 ENCOUNTER — Telehealth: Payer: Self-pay | Admitting: *Deleted

## 2014-10-11 NOTE — Telephone Encounter (Signed)
Received a refill request for tramadol for this patient. Looks like it was origanaly rx'd for polyarthralgia in 2013. This was last refilled on  09/12/14. Unless I am overlooking something I think this warrants an office visit since pt has not been seen for this within the past year from what I can see. Pt was last seen 06/2014

## 2014-10-13 ENCOUNTER — Encounter: Payer: Self-pay | Admitting: Family Medicine

## 2014-10-13 ENCOUNTER — Ambulatory Visit (INDEPENDENT_AMBULATORY_CARE_PROVIDER_SITE_OTHER): Payer: Commercial Managed Care - HMO | Admitting: Family Medicine

## 2014-10-13 VITALS — BP 143/63 | HR 78 | Wt 161.0 lb

## 2014-10-13 DIAGNOSIS — E119 Type 2 diabetes mellitus without complications: Secondary | ICD-10-CM

## 2014-10-13 DIAGNOSIS — Z23 Encounter for immunization: Secondary | ICD-10-CM | POA: Diagnosis not present

## 2014-10-13 DIAGNOSIS — J449 Chronic obstructive pulmonary disease, unspecified: Secondary | ICD-10-CM

## 2014-10-13 DIAGNOSIS — N183 Chronic kidney disease, stage 3 unspecified: Secondary | ICD-10-CM

## 2014-10-13 MED ORDER — ALBUTEROL SULFATE HFA 108 (90 BASE) MCG/ACT IN AERS
2.0000 | INHALATION_SPRAY | Freq: Four times a day (QID) | RESPIRATORY_TRACT | Status: DC | PRN
Start: 2014-10-13 — End: 2015-04-13

## 2014-10-13 NOTE — Progress Notes (Signed)
   Subjective:    Patient ID: Theresa Huang, female    DOB: Oct 25, 1930, 81 y.o.   MRN: [id number]  HPI Diabetes - no hypoglycemic events. No wounds or sores that are not healing well. No increased thirst or urination. Checking glucose at home. Home blood sugars running between 80-100 fasting in the mornings. Taking medications as prescribed without any side effects. Due for eye exam   COPD - Does wear her oxygen when needed.  She is not any recent exacerbations. Her insurance will no longer pay for pro-air so she does need a replacement for this.  CKD 3  - no change in urination.  Has been lightheaded and dizzy since November.  She did fall the other day and hit her right side near the axilla. She said she was not dizzy at that particular time. She just lost her balance. She denies tripping over anything. She has a bruise but she otherwise is fine. She's not have any significant pain or tenderness. She did not hit her head.     Review of Systems     Objective:   Physical Exam  Constitutional: She is oriented to person, place, and time. She appears well-developed and well-nourished.  HENT:  Head: Normocephalic and atraumatic.  Cardiovascular: Normal rate, regular rhythm and normal heart sounds.   Pulmonary/Chest: Effort normal and breath sounds normal.  Neurological: She is alert and oriented to person, place, and time.  Skin: Skin is warm and dry.  Psychiatric: She has a normal mood and affect. Her behavior is normal.          Assessment & Plan:  Diabetes-Well controlled.  Reminded that she's due for her eye exam try to get this scheduled. Foot exam performed today. Is due for lipid panel. On ASA. Not on a statin.    COPD-we'll change her Provera to Proventil. New prescription sent today. Please call patient promises at the pharmacy. Pneumonia vaccine updated today.  CKD 3-we'll monitor kidney function every 6 months.  Dizziness-we did not get to evaluate this further  today. Her daughter had to leave to get to another appointment. So ate decided to focus on her diabetes and COPD today.  Given Prevnar 13 today.

## 2014-10-14 LAB — BASIC METABOLIC PANEL WITH GFR
BUN: 30 mg/dL — AB (ref 6–23)
CALCIUM: 10.1 mg/dL (ref 8.4–10.5)
CO2: 31 mEq/L (ref 19–32)
CREATININE: 1.72 mg/dL — AB (ref 0.50–1.10)
Chloride: 101 mEq/L (ref 96–112)
GFR, EST AFRICAN AMERICAN: 32 mL/min — AB
GFR, Est Non African American: 27 mL/min — ABNORMAL LOW
GLUCOSE: 125 mg/dL — AB (ref 70–99)
Potassium: 4.2 mEq/L (ref 3.5–5.3)
Sodium: 142 mEq/L (ref 135–145)

## 2014-10-14 LAB — LIPID PANEL
Cholesterol: 172 mg/dL (ref 0–200)
HDL: 66 mg/dL (ref >39)
LDL Cholesterol: 76 mg/dL (ref 0–99)
TRIGLYCERIDES: 149 mg/dL (ref <150)
Total CHOL/HDL Ratio: 2.6 Ratio
VLDL: 30 mg/dL (ref 0–40)

## 2014-10-16 ENCOUNTER — Other Ambulatory Visit: Payer: Self-pay | Admitting: Family Medicine

## 2014-10-16 DIAGNOSIS — E1122 Type 2 diabetes mellitus with diabetic chronic kidney disease: Secondary | ICD-10-CM

## 2014-10-16 DIAGNOSIS — N184 Chronic kidney disease, stage 4 (severe): Secondary | ICD-10-CM

## 2014-10-17 ENCOUNTER — Other Ambulatory Visit: Payer: Self-pay | Admitting: Family Medicine

## 2014-10-17 ENCOUNTER — Other Ambulatory Visit: Payer: Self-pay | Admitting: *Deleted

## 2014-10-17 DIAGNOSIS — E039 Hypothyroidism, unspecified: Secondary | ICD-10-CM

## 2014-10-17 DIAGNOSIS — M810 Age-related osteoporosis without current pathological fracture: Secondary | ICD-10-CM

## 2014-10-20 DIAGNOSIS — M810 Age-related osteoporosis without current pathological fracture: Secondary | ICD-10-CM | POA: Diagnosis not present

## 2014-10-20 DIAGNOSIS — E039 Hypothyroidism, unspecified: Secondary | ICD-10-CM | POA: Diagnosis not present

## 2014-10-21 LAB — VITAMIN D 25 HYDROXY (VIT D DEFICIENCY, FRACTURES): VIT D 25 HYDROXY: 39 ng/mL (ref 30–100)

## 2014-10-21 LAB — PTH, INTACT AND CALCIUM
Calcium: 9.5 mg/dL (ref 8.4–10.5)
PTH: 122 pg/mL — ABNORMAL HIGH (ref 14–64)

## 2014-11-07 ENCOUNTER — Telehealth: Payer: Self-pay | Admitting: Emergency Medicine

## 2014-11-07 NOTE — Telephone Encounter (Signed)
Daughter of patient calling to request cough med for pt.; denies that she has fever and has had Fluand Pneumonia immunizations this season.

## 2014-11-07 NOTE — Telephone Encounter (Signed)
Let's have her make an appointment this week since she does have COPD. It could be a virus or it could be her COPD flaring.

## 2014-11-08 DIAGNOSIS — J45909 Unspecified asthma, uncomplicated: Secondary | ICD-10-CM | POA: Diagnosis not present

## 2014-11-08 DIAGNOSIS — Q348 Other specified congenital malformations of respiratory system: Secondary | ICD-10-CM | POA: Diagnosis not present

## 2014-11-14 ENCOUNTER — Other Ambulatory Visit: Payer: Self-pay | Admitting: Family Medicine

## 2014-12-07 DIAGNOSIS — Q348 Other specified congenital malformations of respiratory system: Secondary | ICD-10-CM | POA: Diagnosis not present

## 2014-12-07 DIAGNOSIS — J45909 Unspecified asthma, uncomplicated: Secondary | ICD-10-CM | POA: Diagnosis not present

## 2014-12-16 ENCOUNTER — Other Ambulatory Visit: Payer: Self-pay | Admitting: Family Medicine

## 2014-12-19 ENCOUNTER — Other Ambulatory Visit: Payer: Self-pay | Admitting: Family Medicine

## 2014-12-26 DIAGNOSIS — I10 Essential (primary) hypertension: Secondary | ICD-10-CM | POA: Diagnosis not present

## 2014-12-26 DIAGNOSIS — E119 Type 2 diabetes mellitus without complications: Secondary | ICD-10-CM | POA: Diagnosis not present

## 2014-12-26 DIAGNOSIS — N183 Chronic kidney disease, stage 3 (moderate): Secondary | ICD-10-CM | POA: Diagnosis not present

## 2014-12-26 DIAGNOSIS — R262 Difficulty in walking, not elsewhere classified: Secondary | ICD-10-CM | POA: Diagnosis not present

## 2014-12-27 LAB — CBC AND DIFFERENTIAL
Hemoglobin: 12 g/dL (ref 12.0–16.0)
Platelets: 219 10*3/uL (ref 150–399)
WBC: 6.5 10*3/mL

## 2014-12-27 LAB — BASIC METABOLIC PANEL
CREATININE: 1.6 mg/dL — AB (ref 0.5–1.1)
POTASSIUM: 4.5 mmol/L (ref 3.4–5.3)
SODIUM: 138 mmol/L (ref 137–147)

## 2015-01-07 DIAGNOSIS — Q348 Other specified congenital malformations of respiratory system: Secondary | ICD-10-CM | POA: Diagnosis not present

## 2015-01-07 DIAGNOSIS — J45909 Unspecified asthma, uncomplicated: Secondary | ICD-10-CM | POA: Diagnosis not present

## 2015-01-12 ENCOUNTER — Ambulatory Visit: Payer: Medicare HMO | Admitting: Family Medicine

## 2015-01-17 ENCOUNTER — Encounter: Payer: Self-pay | Admitting: Family Medicine

## 2015-01-24 ENCOUNTER — Other Ambulatory Visit: Payer: Self-pay | Admitting: Family Medicine

## 2015-01-26 ENCOUNTER — Encounter: Payer: Self-pay | Admitting: Family Medicine

## 2015-01-26 ENCOUNTER — Ambulatory Visit (INDEPENDENT_AMBULATORY_CARE_PROVIDER_SITE_OTHER): Payer: Medicare HMO | Admitting: Family Medicine

## 2015-01-26 VITALS — BP 114/50 | HR 97 | Wt 157.0 lb

## 2015-01-26 DIAGNOSIS — E119 Type 2 diabetes mellitus without complications: Secondary | ICD-10-CM

## 2015-01-26 DIAGNOSIS — G4700 Insomnia, unspecified: Secondary | ICD-10-CM | POA: Diagnosis not present

## 2015-01-26 DIAGNOSIS — J449 Chronic obstructive pulmonary disease, unspecified: Secondary | ICD-10-CM | POA: Diagnosis not present

## 2015-01-26 DIAGNOSIS — K5901 Slow transit constipation: Secondary | ICD-10-CM | POA: Diagnosis not present

## 2015-01-26 LAB — POCT UA - MICROALBUMIN
Creatinine, POC: 200 mg/dL
MICROALBUMIN (UR) POC: 30 mg/L

## 2015-01-26 LAB — POCT GLYCOSYLATED HEMOGLOBIN (HGB A1C): Hemoglobin A1C: 5.6

## 2015-01-26 NOTE — Progress Notes (Signed)
   Subjective:    Patient ID: Theresa Huang, female    DOB: [birth year], 81 y.o.   MRN: [id number]  HPI  COPD/Asthma f/u - sh will wear her oxygen when it is humid.  Uses her alubterol  A couple of times a day.  Using her spiriva daily.    Diabetes - no hypoglycemic events. No wounds or sores that are not healing well. No increased thirst or urination. Checking glucose at home. Taking medications as prescribed without any side effects.  Constipation - she has been constipated for months. Says passing small stools.    Insomnia - She tries to go bed COPD/ around 10PM.  Doesn't usually fall alseep around 2-3 AM.  But then sleeping until noon the next days. No caffeine.   Review of Systems     Objective:   Physical Exam  Constitutional: She is oriented to person, place, and time. She appears well-developed and well-nourished.  HENT:  Head: Normocephalic and atraumatic.  Cardiovascular: Normal rate, regular rhythm and normal heart sounds.   Pulmonary/Chest: Effort normal and breath sounds normal.  Neurological: She is alert and oriented to person, place, and time.  Skin: Skin is warm and dry.  Psychiatric: She has a normal mood and affect. Her behavior is normal.          Assessment & Plan:  DM- well controlled. Hemoglobin A1c is 5.6 today which is fantastic. reminded to get yearly eye exam.follow-up in 3 months.  Constipation - recommend miralax.  Instructions written down for patient. If not noticing some relief within the next couple weeks and please let us know.  Asthma - Stable.  Continue Spiriva and as needed albuterol.she has not had any recent exacerbations.  Insomnia- it sounds like her sleep cycle is completely off. We discussed incrementally moving her sleep time back. Recommend that she stay up until 2 AM since that's about the time she typically falls asleep anyway and then set her alarm clock to get 9 hours later. Avoid taking naps during the day. And gradually moved  sleep forward. Also recommend adding melatonin about an hour before bedtime to help reset the sleep clock. Follow-up in 3 months.

## 2015-01-26 NOTE — Patient Instructions (Addendum)
For you constipation - Recommend miralax once a day until the stools are soft and mushy.  Just a reminder-please try to get your eye exam scheduled for the summer.  For your sleep - set bedtime to about 2 AM. Set your alarm clock to get up at 11 AM. This should give you 9 hours to sleep. Get up at your wake time even if you have not slept well. Avoid taking naps during the daytime. After one week then moved bedtime to approximately 1:30 AM. Or 1:45 is fine as well. Then moved your alarm clock awakening time to 10:30 AM.  Every week move bedtime and wake time ahead by 30 minutes until eventually you are going to bed at the time that he would like to. Recommend a trial of melatonin between 4-6 mg about an hour before bedtime to also help reset the internal sleep clock.

## 2015-02-06 DIAGNOSIS — J45909 Unspecified asthma, uncomplicated: Secondary | ICD-10-CM | POA: Diagnosis not present

## 2015-02-06 DIAGNOSIS — Q348 Other specified congenital malformations of respiratory system: Secondary | ICD-10-CM | POA: Diagnosis not present

## 2015-02-15 ENCOUNTER — Other Ambulatory Visit: Payer: Self-pay | Admitting: Family Medicine

## 2015-02-16 ENCOUNTER — Other Ambulatory Visit: Payer: Self-pay | Admitting: Family Medicine

## 2015-02-24 ENCOUNTER — Other Ambulatory Visit: Payer: Self-pay | Admitting: Family Medicine

## 2015-02-27 ENCOUNTER — Other Ambulatory Visit: Payer: Self-pay | Admitting: Family Medicine

## 2015-03-09 DIAGNOSIS — J45909 Unspecified asthma, uncomplicated: Secondary | ICD-10-CM | POA: Diagnosis not present

## 2015-03-09 DIAGNOSIS — Q348 Other specified congenital malformations of respiratory system: Secondary | ICD-10-CM | POA: Diagnosis not present

## 2015-03-15 ENCOUNTER — Other Ambulatory Visit: Payer: Self-pay | Admitting: Family Medicine

## 2015-03-27 ENCOUNTER — Other Ambulatory Visit: Payer: Self-pay | Admitting: Family Medicine

## 2015-04-08 DIAGNOSIS — Q348 Other specified congenital malformations of respiratory system: Secondary | ICD-10-CM | POA: Diagnosis not present

## 2015-04-08 DIAGNOSIS — J45909 Unspecified asthma, uncomplicated: Secondary | ICD-10-CM | POA: Diagnosis not present

## 2015-04-11 ENCOUNTER — Other Ambulatory Visit: Payer: Self-pay | Admitting: Family Medicine

## 2015-04-13 ENCOUNTER — Other Ambulatory Visit: Payer: Self-pay | Admitting: Family Medicine

## 2015-04-28 ENCOUNTER — Ambulatory Visit: Payer: Commercial Managed Care - HMO | Admitting: Family Medicine

## 2015-05-01 ENCOUNTER — Ambulatory Visit: Payer: Commercial Managed Care - HMO | Admitting: Family Medicine

## 2015-05-09 DIAGNOSIS — Q348 Other specified congenital malformations of respiratory system: Secondary | ICD-10-CM | POA: Diagnosis not present

## 2015-05-09 DIAGNOSIS — J45909 Unspecified asthma, uncomplicated: Secondary | ICD-10-CM | POA: Diagnosis not present

## 2015-05-12 ENCOUNTER — Other Ambulatory Visit: Payer: Self-pay | Admitting: Family Medicine

## 2015-05-23 ENCOUNTER — Other Ambulatory Visit: Payer: Self-pay | Admitting: Family Medicine

## 2015-05-31 ENCOUNTER — Ambulatory Visit: Payer: Commercial Managed Care - HMO | Admitting: Family Medicine

## 2015-06-09 DIAGNOSIS — J45909 Unspecified asthma, uncomplicated: Secondary | ICD-10-CM | POA: Diagnosis not present

## 2015-06-09 DIAGNOSIS — Q348 Other specified congenital malformations of respiratory system: Secondary | ICD-10-CM | POA: Diagnosis not present

## 2015-06-13 ENCOUNTER — Other Ambulatory Visit: Payer: Self-pay | Admitting: Family Medicine

## 2015-06-15 ENCOUNTER — Other Ambulatory Visit: Payer: Self-pay | Admitting: Family Medicine

## 2015-06-16 ENCOUNTER — Ambulatory Visit (INDEPENDENT_AMBULATORY_CARE_PROVIDER_SITE_OTHER): Payer: Commercial Managed Care - HMO | Admitting: Family Medicine

## 2015-06-16 ENCOUNTER — Encounter: Payer: Self-pay | Admitting: Family Medicine

## 2015-06-16 VITALS — BP 113/52 | HR 74 | Temp 98.9°F | Wt 154.0 lb

## 2015-06-16 DIAGNOSIS — Z23 Encounter for immunization: Secondary | ICD-10-CM

## 2015-06-16 DIAGNOSIS — F329 Major depressive disorder, single episode, unspecified: Secondary | ICD-10-CM

## 2015-06-16 DIAGNOSIS — E039 Hypothyroidism, unspecified: Secondary | ICD-10-CM

## 2015-06-16 DIAGNOSIS — F32A Depression, unspecified: Secondary | ICD-10-CM

## 2015-06-16 DIAGNOSIS — L723 Sebaceous cyst: Secondary | ICD-10-CM

## 2015-06-16 DIAGNOSIS — R251 Tremor, unspecified: Secondary | ICD-10-CM

## 2015-06-16 DIAGNOSIS — R209 Unspecified disturbances of skin sensation: Secondary | ICD-10-CM | POA: Diagnosis not present

## 2015-06-16 DIAGNOSIS — E119 Type 2 diabetes mellitus without complications: Secondary | ICD-10-CM

## 2015-06-16 DIAGNOSIS — R208 Other disturbances of skin sensation: Secondary | ICD-10-CM

## 2015-06-16 DIAGNOSIS — L089 Local infection of the skin and subcutaneous tissue, unspecified: Secondary | ICD-10-CM

## 2015-06-16 DIAGNOSIS — R202 Paresthesia of skin: Secondary | ICD-10-CM | POA: Diagnosis not present

## 2015-06-16 DIAGNOSIS — E118 Type 2 diabetes mellitus with unspecified complications: Secondary | ICD-10-CM | POA: Diagnosis not present

## 2015-06-16 LAB — POCT GLYCOSYLATED HEMOGLOBIN (HGB A1C): HEMOGLOBIN A1C: 5.3

## 2015-06-16 MED ORDER — FLUOXETINE HCL 10 MG PO CAPS: ORAL_CAPSULE | ORAL | Status: DC

## 2015-06-16 MED ORDER — DOXYCYCLINE HYCLATE 100 MG PO TABS: 100.0000 mg | ORAL_TABLET | Freq: Two times a day (BID) | ORAL | Status: DC

## 2015-06-16 MED ORDER — GLIPIZIDE ER 5 MG PO TB24: 5.0000 mg | ORAL_TABLET | Freq: Every day | ORAL | Status: DC

## 2015-06-16 NOTE — Progress Notes (Signed)
Subjective:    Patient ID: Theresa Huang, female    DOB: [birth year], 81 y.o.   MRN: [id number]  HPI Diabetes - no hypoglycemic events. No wounds or sores that are not healing well. No increased thirst or urination. Checking glucose at home. Taking medications as prescribed without any side effects. She has been having some low blood sguars.  Last Sunday blood sugar dropped in the 20s and they had to call EMS.  Feelslike sometimes sugar is dropped at night. Thought sometimes she feels restless and can't sleep  Says she feels depressed.  Says it saddens her that she can't do what she used to do.  She feels more winded.    She has a lesion on her back that is painful and has been oozing and burning.  Has been there for about a month. Thought it was a pimple at first but getting larger. drianage has been yellow. Lesion is approx 1.5 x 1.5 cm in size.    She got a new hearing aid.  She really like it.    Son in law Gwyndolyn Saxon is with her today.   Sometimes she just feels really down. She just feels like she doesn't have much of a purpose. She does try to help around the house that she does sleep with her daughter and son-in-law but doesn't really get out and meet with other friends her age and says she feels down about this at times. She also has trouble hearing says felt like this has affected her ability to be out and about as well.  Review of Systems She feels like her balance  Has been off for a few months.  Feels ike her hands are burning. No numbness.      Objective:   Physical Exam  Constitutional: She is oriented to person, place, and time. She appears well-developed and well-nourished.  HENT:  Head: Normocephalic and atraumatic.  Cardiovascular: Normal rate, regular rhythm and normal heart sounds.   Pulmonary/Chest: Effort normal and breath sounds normal.  Musculoskeletal: She exhibits no edema.  Neurological: She is alert and oriented to person, place, and time. She displays normal  reflexes. No cranial nerve deficit. She exhibits normal muscle tone. Coordination normal.  Skin: Skin is warm and dry.  Psychiatric: She has a normal mood and affect. Her behavior is normal. Judgment and thought content normal.    On her back she has an approximately 1/2 cm round erythematous lesion just under the bra strap area on the left mid back. She has 2 pinpoint areas where we were able to express some white sebum and maybe pus. Culture was obtained.      Assessment & Plan:  DM well controlled but with hypoglycemia- well controlled but having hypoglycemic events. I will decrease the glipizide to 5mg  daily and adjust the lantus to 8 units at bedtime. I like to see her back in about 6 weeks just to make sure that she's doing okay and that the blood sugars are better regulated. This might even be contributing to some of her shakiness.  Infected seb cyst - culture obtained today. We'll call with results. The meantime we'll go ahead and put her on doxycycline to cover for MRSA. Will call with results once available.  Hand burning sensatoin -we'll check for B12 deficiency and thyroid abnormality. Next  Depression-she's artery on sertraline 150 mg. It sounds like it's not being very effective at this time. Discussed weaning this medication in trying something else. We'll start her  on fluoxetine. Follow up in 6 weeks.  Balance issues - I would like to address this a little bit further when I see her back next time. She does use a walker at home sometimes but did not bring it with her today. Next  Karl Ito does have a slight tremor in her hands. It may just be that her tremor is getting worse but may be related to her blood sugars as well. We will will see if this improves after the adjustment of her medication over the next couple of weeks.

## 2015-06-16 NOTE — Patient Instructions (Signed)
Will decrease glipizide to 5 mg. New prescription sent to the pharmacy. Use 8 units of Lantus daily at bedtime. Do not go up and down on the dosing. If you're still having any blood sugars under 75 after the next 2 weeks and please give [place] a call back. Start the doxycycline to treat the infection on your back. Make sure to treat the full course of antibiotic Decrease the sertraline to 1 tab daily for 5 days and then a half tab daily for 5 days and then switch to the new medication called fluoxetine.

## 2015-06-17 LAB — TSH: TSH: 6.076 u[IU]/mL — AB (ref 0.350–4.500)

## 2015-06-17 LAB — COMPLETE METABOLIC PANEL WITH GFR
ALBUMIN: 4 g/dL (ref 3.6–5.1)
ALT: 26 U/L (ref 6–29)
AST: 21 U/L (ref 10–35)
Alkaline Phosphatase: 85 U/L (ref 33–130)
BUN: 35 mg/dL — AB (ref 7–25)
CHLORIDE: 100 mmol/L (ref 98–110)
CO2: 28 mmol/L (ref 20–31)
Calcium: 9.3 mg/dL (ref 8.6–10.4)
Creat: 1.36 mg/dL — ABNORMAL HIGH (ref 0.60–0.88)
GFR, EST NON AFRICAN AMERICAN: 36 mL/min — AB (ref >=60)
GFR, Est African American: 42 mL/min — ABNORMAL LOW (ref >=60)
GLUCOSE: 200 mg/dL — AB (ref 65–99)
POTASSIUM: 5.6 mmol/L — AB (ref 3.5–5.3)
SODIUM: 137 mmol/L (ref 135–146)
Total Bilirubin: 0.3 mg/dL (ref 0.2–1.2)
Total Protein: 7.1 g/dL (ref 6.1–8.1)

## 2015-06-17 LAB — VITAMIN B12: Vitamin B-12: 638 pg/mL (ref 211–911)

## 2015-06-17 LAB — FOLATE

## 2015-06-19 ENCOUNTER — Telehealth: Payer: Self-pay | Admitting: *Deleted

## 2015-06-19 MED ORDER — SULFAMETHOXAZOLE-TRIMETHOPRIM 800-160 MG PO TABS: 1.0000 | ORAL_TABLET | Freq: Two times a day (BID) | ORAL | Status: DC

## 2015-06-19 NOTE — Telephone Encounter (Signed)
Spoke w/Stan at pt's pharmacy he wanted to know if Dr. Madilyn Fireman was aware of the cross reaction to doxy. Spoke w/Dr. Madilyn Fireman she switched to bactrim.Theresa Huang [place]

## 2015-06-20 ENCOUNTER — Other Ambulatory Visit: Payer: Self-pay | Admitting: *Deleted

## 2015-06-20 DIAGNOSIS — E039 Hypothyroidism, unspecified: Secondary | ICD-10-CM

## 2015-06-20 DIAGNOSIS — E875 Hyperkalemia: Secondary | ICD-10-CM

## 2015-06-20 LAB — WOUND CULTURE: GRAM STAIN: NONE SEEN

## 2015-06-20 LAB — VITAMIN B6: VITAMIN B6: 30.3 ng/mL — AB (ref 2.1–21.7)

## 2015-06-27 ENCOUNTER — Telehealth: Payer: Self-pay

## 2015-06-27 NOTE — Telephone Encounter (Signed)
Spoke with pt's daughter stated she already received results but the pt still has area in the same location and it doesn't look bette. Spoke with Dr. Madilyn Fireman and said to wait a few more days since pt is on a 10 day course of antibiotics and we will reassess the area. Daughter of patient verbalized understanding with no more questions at this time.

## 2015-06-27 NOTE — Telephone Encounter (Signed)
-----   Message from Hali Marry, MD sent at 06/19/2015 12:42 PM EDT ----- Call patient: Please verify that she's taking her thyroid medication regularly about 30 minutes on empty stomach before first meal of the day and at least 4 hours away from multivitamins. If she is then please let me know as I do need to adjust her dose on her medication. We would then need to recheck it in about 6 weeks. Metabolic panel including liver and kidney function is stable. Potassium was high. Not sure exactly why. Usually it's normal but I recommend repeating this in one week. Vitamin B12 looks great. Folate looks great. Bacterial culture was positive for staph infection. The Antibiotic I put her on should clear up the infection.

## 2015-06-28 DIAGNOSIS — I10 Essential (primary) hypertension: Secondary | ICD-10-CM | POA: Diagnosis not present

## 2015-06-28 DIAGNOSIS — N183 Chronic kidney disease, stage 3 (moderate): Secondary | ICD-10-CM | POA: Diagnosis not present

## 2015-07-03 DIAGNOSIS — N183 Chronic kidney disease, stage 3 (moderate): Secondary | ICD-10-CM | POA: Diagnosis not present

## 2015-07-09 DIAGNOSIS — J45909 Unspecified asthma, uncomplicated: Secondary | ICD-10-CM | POA: Diagnosis not present

## 2015-07-09 DIAGNOSIS — Q348 Other specified congenital malformations of respiratory system: Secondary | ICD-10-CM | POA: Diagnosis not present

## 2015-07-14 ENCOUNTER — Encounter: Payer: Self-pay | Admitting: Family Medicine

## 2015-07-14 ENCOUNTER — Ambulatory Visit (INDEPENDENT_AMBULATORY_CARE_PROVIDER_SITE_OTHER): Payer: Commercial Managed Care - HMO | Admitting: Family Medicine

## 2015-07-14 VITALS — BP 122/77 | HR 96 | Temp 98.2°F | Ht 63.0 in | Wt 152.0 lb

## 2015-07-14 DIAGNOSIS — L723 Sebaceous cyst: Secondary | ICD-10-CM

## 2015-07-14 DIAGNOSIS — Z794 Long term (current) use of insulin: Secondary | ICD-10-CM

## 2015-07-14 DIAGNOSIS — Z23 Encounter for immunization: Secondary | ICD-10-CM

## 2015-07-14 DIAGNOSIS — E039 Hypothyroidism, unspecified: Secondary | ICD-10-CM | POA: Diagnosis not present

## 2015-07-14 DIAGNOSIS — E118 Type 2 diabetes mellitus with unspecified complications: Secondary | ICD-10-CM | POA: Diagnosis not present

## 2015-07-14 DIAGNOSIS — R202 Paresthesia of skin: Secondary | ICD-10-CM | POA: Diagnosis not present

## 2015-07-14 DIAGNOSIS — R200 Anesthesia of skin: Secondary | ICD-10-CM

## 2015-07-14 MED ORDER — SYNTHROID 75 MCG PO TABS: 75.0000 ug | ORAL_TABLET | Freq: Every day | ORAL | Status: DC

## 2015-07-14 NOTE — Progress Notes (Signed)
   Subjective:    Patient ID: Theresa Huang, female    DOB: November 21, 1930, 81 y.o.   MRN: [id number]  HPI Right hand numbness and burning that is intermittant. Has been coming and going for several months. Often wakes her up at night with a burning sensation. She would just move her hands and fists back and forth until it feels better. She said she feels like her hands are drawing up but denies an actual cramping sensation..   Hypothyroid - TSH was off. She says she is taking it regularly. She does admit she has gained some weight recently even though she really hasn't changed her diet. No other skin or hair changes.  DM- had a severe hypoglycemia about a month ago. She still using 8 units of Lantus at nighttime. She did bring in her blood glucose log. Most for blood sugars are under 110.  seb cyst - she did complete the antibiotic but still having a lot of drainage and increasing from the cyst and its itchy. No fevers chills or sweats.  Review of Systems     Objective:   Physical Exam  Constitutional: She appears well-developed and well-nourished.  HENT:  Head: Normocephalic and atraumatic.  Musculoskeletal:  Right hand and wrist with normal range of motion of the fingers and wrist. Negative Phalen's sign. Negative Tennille sign. Elbow with normal range of motion.  Question maybe some mild thenar atrophy but it seems to be symmetric bilaterally.  Skin: Skin is warm and dry.  On her back near the mid back at the bra strap area she has 2 open draining wounds. Easily compressible. A fair amount of pus was compressed from the area. Some slight erythema around the edges.          Assessment & Plan:  Right hand numbness-her B12 levels etc. were normal. At this point recommend further evaluation with nerve conduction study to see if this is median neuropathy or thickened related to her neck or elbow area. Will call with results once available.   Hypothyroid - increase Synthroid to 75 g.  She prefers Erlene Quan feels better on it compared to the generic. Follow-up and repeat lab work in 6-8 weeks. Next  Sebaceous cyst-and no longer looks infected but is still continuing to drain and seems to have some tracks and tunnels underneath so recommend full excision by dermatology. Worth place referral to dermatology.  Diabetes-we'll have her stop her evening insulin completely until her follow-up with me for her next diabetic appointment. She can call us in the interim if her blood sugars are elevating.

## 2015-07-18 ENCOUNTER — Other Ambulatory Visit: Payer: Self-pay | Admitting: Family Medicine

## 2015-07-21 ENCOUNTER — Other Ambulatory Visit: Payer: Self-pay | Admitting: Family Medicine

## 2015-08-01 ENCOUNTER — Telehealth: Payer: Self-pay

## 2015-08-01 NOTE — Telephone Encounter (Signed)
I had place a referral on 10/28 for dermatology.  Have they not heard back from them yet for an appointment?

## 2015-08-01 NOTE — Telephone Encounter (Signed)
Patient states the cyst is still draining and is very painful. She would like to know what is the next step.

## 2015-08-02 NOTE — Telephone Encounter (Signed)
She no show the appointment now they will not see her. Awaiting a call back from her daughter for the name of the Dermatologist she wants her mom to see.

## 2015-08-09 DIAGNOSIS — J45909 Unspecified asthma, uncomplicated: Secondary | ICD-10-CM | POA: Diagnosis not present

## 2015-08-09 DIAGNOSIS — Q348 Other specified congenital malformations of respiratory system: Secondary | ICD-10-CM | POA: Diagnosis not present

## 2015-08-15 ENCOUNTER — Telehealth: Payer: Self-pay

## 2015-08-15 NOTE — Telephone Encounter (Signed)
error 

## 2015-08-16 ENCOUNTER — Other Ambulatory Visit: Payer: Self-pay | Admitting: Family Medicine

## 2015-08-17 NOTE — Telephone Encounter (Signed)
Patient advised we are still waiting for a call back with the name of a dermatologist.

## 2015-08-24 NOTE — Telephone Encounter (Signed)
Patient called and reports Dr Baltazar Najjar for Dermatology.

## 2015-08-29 ENCOUNTER — Ambulatory Visit (INDEPENDENT_AMBULATORY_CARE_PROVIDER_SITE_OTHER): Payer: Self-pay | Admitting: Neurology

## 2015-08-29 ENCOUNTER — Ambulatory Visit (INDEPENDENT_AMBULATORY_CARE_PROVIDER_SITE_OTHER): Payer: Commercial Managed Care - HMO | Admitting: Neurology

## 2015-08-29 DIAGNOSIS — G5601 Carpal tunnel syndrome, right upper limb: Secondary | ICD-10-CM | POA: Diagnosis not present

## 2015-08-29 DIAGNOSIS — G5603 Carpal tunnel syndrome, bilateral upper limbs: Secondary | ICD-10-CM

## 2015-08-29 DIAGNOSIS — G5602 Carpal tunnel syndrome, left upper limb: Secondary | ICD-10-CM

## 2015-08-29 DIAGNOSIS — Z0289 Encounter for other administrative examinations: Secondary | ICD-10-CM

## 2015-08-29 NOTE — Procedures (Signed)
  [facility]    Provider:  Dr Jaynee Eagles Referring Provider: Hali Marry, * Primary Care Physician:  Beatrice Lecher, MD  History:  Theresa Huang is a 81 y.o. female here as a referral from Dr. Madilyn Fireman for hand pain and tingling. Symptoms are in all the fingers. She can't feel the tips, feels like sand paper. She has weakness in the right hand, shakes it out a lot and drops things. Denies neck pain or radicular symptoms.  Summary:   Nerve Conduction Studies were performed on the bilateral upper extremities.  The right median APB motor nerve showed prolonged distal onset latency (9.4 ms, N<4.0). The right Median 2nd Digit sensory nerve showed no response F Wave studies indicate that the right Median F wave has delayed latency(39, N<60ms).  The left median APB motor nerve showed prolonged distal onset latency (5.7 ms, N<4.0). The left Median 2nd Digit sensory nerve showed prolonged distal peak latency (4.6 ms, N<3.9).  F Wave studies indicate that the right Median F wave has delayed latency(36.2, N<29ms).   Bilateral Ulnar ADM motor nerves were within normal limits. F Wave studies indicate that the bilateral Ulnar F waves have normal latencies The bilateralUlnar 5th digit sensory nerves were within normal limits.    EMG needle study of selected right upper extremity muscles was performed. The right Opponens Poliicis muscle showed increased spontaneous activity (+3 positive waves), increased motor unit amplitude, diminished motor unit recruitment, prolonged motor unit duration and Polyphasic motor units. The following muscles were normal: bilateral Deltoid, bilateral Triceps, bilateral Pronator Teres, left Opponens Pollicis, bilateral First Dorsal Interosseous, bilateral Extensor Indices.   Conclusion: This is an abnormal study. There is electrophysiologic evidence of severe right and moderate left Carpal Tunnel Syndrome. No suggestion of polyneuropathy  or radiculopathy. Clinical correlation recommended.   Sarina Ill, MD  [facility] [address] [place], [place] [address]  Phone [phone] Fax [phone]

## 2015-08-29 NOTE — Progress Notes (Signed)
  [facility]    Provider:  Dr Jaynee Eagles Referring Provider: Hali Marry, * Primary Care Physician:  Beatrice Lecher, MD  History:  Theresa Huang is a 81 y.o. female here as a referral from Dr. Madilyn Fireman for hand pain and tingling. Symptoms are in all the fingers. She can't feel the tips, feels like sand paper. She has weakness in the right hand, shakes it out a lot and drops things. Denies neck pain or radicular symptoms.  Summary:   Nerve Conduction Studies were performed on the bilateral upper extremities.  The right median APB motor nerve showed prolonged distal onset latency (9.4 ms, N<4.0). The right Median 2nd Digit sensory nerve showed no response F Wave studies indicate that the right Median F wave has delayed latency(39, N<87ms).  The left median APB motor nerve showed prolonged distal onset latency (5.7 ms, N<4.0). The left Median 2nd Digit sensory nerve showed prolonged distal peak latency (4.6 ms, N<3.9).  F Wave studies indicate that the right Median F wave has delayed latency(36.2, N<66ms).   Bilateral Ulnar ADM motor nerves were within normal limits. F Wave studies indicate that the bilateral Ulnar F waves have normal latencies The bilateralUlnar 5th digit sensory nerves were within normal limits.    EMG needle study of selected right upper extremity muscles was  performed. The right Opponens Poliicis muscle showed increased spontaneous activity (+3 positive waves), increased motor unit amplitude, diminished motor unit recruitment, prolonged motor unit duration and Polyphasic motor units. The following muscles were normal: bilateral Deltoid, bilateral Triceps, bilateral Pronator Teres, left Opponens Pollicis, bilateral First Dorsal Interosseous, bilateral Extensor Indices.   Conclusion: This is an abnormal study. There is electrophysiologic evidence of severe right  And moderate left Carpal Tunnel Syndrome. No suggestion of  polyneuropathy or radiculopathy. Clinical correlation recommended.   Sarina Ill, MD  [facility] [address] [place], [place] [address]  Phone [phone] Fax [phone]

## 2015-08-29 NOTE — Progress Notes (Signed)
See procedure note.

## 2015-08-30 ENCOUNTER — Telehealth: Payer: Self-pay | Admitting: Family Medicine

## 2015-08-30 NOTE — Telephone Encounter (Signed)
Please call patient: I received the results of her nerve conduction study. It shows severe right snd moderate left Carpal Tunnel Syndrome.  I would like to get her in with one of our sports medicine doctors to consider possible injections to see if this helps relieve her symptoms. If it's not helpful then we might even consider surgical referral in the future. Okay to schedule with Dr. Georgina Snell or Dr. Dianah Field.

## 2015-09-05 ENCOUNTER — Ambulatory Visit (INDEPENDENT_AMBULATORY_CARE_PROVIDER_SITE_OTHER): Payer: Commercial Managed Care - HMO | Admitting: Family Medicine

## 2015-09-05 ENCOUNTER — Encounter: Payer: Self-pay | Admitting: Family Medicine

## 2015-09-05 VITALS — BP 136/55 | HR 82 | Wt 152.0 lb

## 2015-09-05 DIAGNOSIS — G5600 Carpal tunnel syndrome, unspecified upper limb: Secondary | ICD-10-CM | POA: Insufficient documentation

## 2015-09-05 DIAGNOSIS — G5603 Carpal tunnel syndrome, bilateral upper limbs: Secondary | ICD-10-CM | POA: Diagnosis not present

## 2015-09-05 NOTE — Assessment & Plan Note (Signed)
Bilateral right worse than left. Injection today. Recommend wrist brace. Recheck in 2 weeks.

## 2015-09-05 NOTE — Patient Instructions (Signed)
Thank you for coming in today. Call or go to the ER if you develop a large red swollen joint with extreme pain or oozing puss.   Use the brace for 2 weeks.   Return in 2-3 weeks.   Carpal Tunnel Syndrome Carpal tunnel syndrome is a condition that causes pain in your hand and arm. The carpal tunnel is a narrow area located on the palm side of your wrist. Repeated wrist motion or certain diseases may cause swelling within the tunnel. This swelling pinches the main nerve in the wrist (median nerve). CAUSES  This condition may be caused by:   Repeated wrist motions.  Wrist injuries.  Arthritis.  A cyst or tumor in the carpal tunnel.  Fluid buildup during pregnancy. Sometimes the cause of this condition is not known.  RISK FACTORS This condition is more likely to develop in:   People who have jobs that cause them to repeatedly move their wrists in the same motion, such as butchers and cashiers.  Women.  People with certain conditions, such as:  Diabetes.  Obesity.  An underactive thyroid (hypothyroidism).  Kidney failure. SYMPTOMS  Symptoms of this condition include:   A tingling feeling in your fingers, especially in your thumb, index, and middle fingers.  Tingling or numbness in your hand.  An aching feeling in your entire arm, especially when your wrist and elbow are bent for long periods of time.  Wrist pain that goes up your arm to your shoulder.  Pain that goes down into your palm or fingers.  A weak feeling in your hands. You may have trouble grabbing and holding items. Your symptoms may feel worse during the night.  DIAGNOSIS  This condition is diagnosed with a medical history and physical exam. You may also have tests, including:   An electromyogram (EMG). This test measures electrical signals sent by your nerves into the muscles.  X-rays. TREATMENT  Treatment for this condition includes:  Lifestyle changes. It is important to stop doing or modify the  activity that caused your condition.  Physical or occupational therapy.  Medicines for pain and inflammation. This may include medicine that is injected into your wrist.  A wrist splint.  Surgery. HOME CARE INSTRUCTIONS  If You Have a Splint:  Wear it as told by your health care provider. Remove it only as told by your health care provider.  Loosen the splint if your fingers become numb and tingle, or if they turn cold and blue.  Keep the splint clean and dry. General Instructions  Take over-the-counter and prescription medicines only as told by your health care provider.  Rest your wrist from any activity that may be causing your pain. If your condition is work related, talk to your employer about changes that can be made, such as getting a wrist pad to use while typing.  If directed, apply ice to the painful area:  Put ice in a plastic bag.  Place a towel between your skin and the bag.  Leave the ice on for 20 minutes, 2-3 times per day.  Keep all follow-up visits as told by your health care provider. This is important.  Do any exercises as told by your health care provider, physical therapist, or occupational therapist. [place] IF:   You have new symptoms.  Your pain is not controlled with medicines.  Your symptoms get worse.   This information is not intended to replace advice given to you by your health care provider. Make sure  you discuss any questions you have with your health care provider.   Document Released: 08/30/2000 Document Revised: 05/24/2015 Document Reviewed: 01/18/2015 Elsevier Interactive Patient Education [employer].

## 2015-09-05 NOTE — Progress Notes (Signed)
   Subjective:    I'm seeing this patient as a consultation for:  Dr Madilyn Fireman  CC: Carpal tunnel syndrome  HPI: She has bilateral carpal tunnel syndrome right worse than left confirmed on nerve conduction studies recently. She has right hand pain and numbness and paresthesias especially the first 3 digits of her right hand. She notes that she occasionally drops objects. She denies any developing weakness. No fevers chills nausea vomiting or diarrhea. She does have a consistent tremor bilaterally which has not changed much.  Past medical history, Surgical history, Family history not pertinant except as noted below, Social history, Allergies, and medications have been entered into the medical record, reviewed, and no changes needed.   Review of Systems: No headache, visual changes, nausea, vomiting, diarrhea, constipation, dizziness, abdominal pain, skin rash, fevers, chills, night sweats, weight loss, swollen lymph nodes, body aches, joint swelling, muscle aches, chest pain, shortness of breath, mood changes, visual or auditory hallucinations.   Objective:    Filed Vitals:   09/05/15 1450  BP: 136/55  Pulse: 82   General: Well Developed, well nourished, and in no acute distress.  Neuro/Psych: Alert and oriented x3, extra-ocular muscles intact, able to move all 4 extremities, sensation grossly intact. Skin: Warm and dry, no rashes noted.  Respiratory: Not using accessory muscles, speaking in full sentences, trachea midline.  Cardiovascular: Pulses palpable, no extremity edema. Abdomen: Does not appear distended. MSK: Hands bilaterally have mild atrophy of the thenar and hyperthenar areas but they are symmetrical. She does have intact grip strength pulses capillary refill and sensation.  Procedure: Real-time Ultrasound Guided hydrodissection of the right median nerve at the carpal tunnel.  Device: GE Logiq E  Images permanently stored and available for review in the ultrasound  unit. Verbal informed consent obtained. Discussed risks and benefits of procedure. Warned about infection bleeding damage to structures skin hypopigmentation and fat atrophy among others. Patient expresses understanding and agreement Time-out conducted.  Noted no overlying erythema, induration, or other signs of local infection.  Skin prepped in a sterile fashion.  Local anesthesia: Topical Ethyl chloride.  With sterile technique and under real time ultrasound guidance a 25-gauge cannula was inserted near the median nerve. 40 mg of Kenalog and 3 mL of lidocaine were injected slowly achieving good hydrodissection of the median nerve in the carpal tunnel.  Completed without difficulty  Patient tolerated procedure well. Advised to call if fevers/chills, erythema, induration, drainage, or persistent bleeding.  Images permanently stored and available for review in the ultrasound unit.  Impression: Technically successful ultrasound guided injection.    No results found for this or any previous visit (from the past 24 hour(s)). No results found.  Impression and Recommendations:   This case required medical decision making of moderate complexity.

## 2015-09-08 DIAGNOSIS — J45909 Unspecified asthma, uncomplicated: Secondary | ICD-10-CM | POA: Diagnosis not present

## 2015-09-08 DIAGNOSIS — Q348 Other specified congenital malformations of respiratory system: Secondary | ICD-10-CM | POA: Diagnosis not present

## 2015-09-14 DIAGNOSIS — I340 Nonrheumatic mitral (valve) insufficiency: Secondary | ICD-10-CM | POA: Diagnosis not present

## 2015-09-14 DIAGNOSIS — E118 Type 2 diabetes mellitus with unspecified complications: Secondary | ICD-10-CM | POA: Diagnosis not present

## 2015-09-14 DIAGNOSIS — M810 Age-related osteoporosis without current pathological fracture: Secondary | ICD-10-CM | POA: Diagnosis not present

## 2015-09-14 DIAGNOSIS — F3341 Major depressive disorder, recurrent, in partial remission: Secondary | ICD-10-CM | POA: Diagnosis not present

## 2015-09-14 DIAGNOSIS — I5030 Unspecified diastolic (congestive) heart failure: Secondary | ICD-10-CM | POA: Diagnosis not present

## 2015-09-14 DIAGNOSIS — Z87891 Personal history of nicotine dependence: Secondary | ICD-10-CM | POA: Diagnosis not present

## 2015-09-14 DIAGNOSIS — M1991 Primary osteoarthritis, unspecified site: Secondary | ICD-10-CM | POA: Diagnosis not present

## 2015-09-14 DIAGNOSIS — E038 Other specified hypothyroidism: Secondary | ICD-10-CM | POA: Diagnosis not present

## 2015-09-14 DIAGNOSIS — R3981 Functional urinary incontinence: Secondary | ICD-10-CM | POA: Diagnosis not present

## 2015-09-14 DIAGNOSIS — Z0000 Encounter for general adult medical examination without abnormal findings: Secondary | ICD-10-CM | POA: Diagnosis not present

## 2015-09-14 DIAGNOSIS — E785 Hyperlipidemia, unspecified: Secondary | ICD-10-CM | POA: Diagnosis not present

## 2015-09-14 DIAGNOSIS — Z6826 Body mass index (BMI) 26.0-26.9, adult: Secondary | ICD-10-CM | POA: Diagnosis not present

## 2015-09-14 LAB — HM DIABETES EYE EXAM

## 2015-09-19 ENCOUNTER — Ambulatory Visit (INDEPENDENT_AMBULATORY_CARE_PROVIDER_SITE_OTHER): Payer: Commercial Managed Care - HMO | Admitting: Family Medicine

## 2015-09-19 ENCOUNTER — Other Ambulatory Visit: Payer: Self-pay | Admitting: Family Medicine

## 2015-09-19 ENCOUNTER — Encounter: Payer: Self-pay | Admitting: Family Medicine

## 2015-09-19 VITALS — BP 144/57 | HR 82 | Wt 157.0 lb

## 2015-09-19 DIAGNOSIS — G5603 Carpal tunnel syndrome, bilateral upper limbs: Secondary | ICD-10-CM

## 2015-09-19 NOTE — Patient Instructions (Signed)
Thank you for coming in today. We will get a hand surgery referral arranged.  Return as needed.

## 2015-09-20 ENCOUNTER — Other Ambulatory Visit: Payer: Self-pay | Admitting: Family Medicine

## 2015-09-20 NOTE — Assessment & Plan Note (Signed)
Discussed options. Referral to hand surgery. Patient declines left-sided injection.

## 2015-09-20 NOTE — Progress Notes (Signed)
Theresa Huang is a 82 y.o. female who presents to [facility] today for follow up right carpal tunnel syndrome.  Patient was seen on 09/05/15 for evaluation and treatment of right-sided severe carpal cough syndrome. She had a ultrasound-guided hydrodissection of the median nerve at the right carpal tunnel. She notes her radicular pain to her fingers has slightly improved however she still has paresthesias and feels that her skin is like sandpaper. She does not think that she is sufficiently improved. Otherwise she feels well. She is willing to consider surgical treatment of this problem.   Past Medical History  Diagnosis Date  . DDD (degenerative disc disease)   . Inflammatory polyps of colon with rectal bleeding ([place])   . Hemorrhoids   . Retinopathy of left eye   . Hearing loss of both ears     wears bilateral hearing aids  . Glaucoma   . Diastolic dysfunction   . Hyperlipidemia   . Hypothyroid   . Anemia   . Irregular heart beat   . Cancer ([place])     SCC  . Holter monitor, abnormal     wearing for 21 days  . Syncope   . Myocardial infarction ([place]) 2006  . Pneumonia     hx  . Shortness of breath     most of time  . COPD (chronic obstructive pulmonary disease) (HCC)     wears oxygen at 3L prn daytime and 3L at night   . Diabetes mellitus (HCC)     fasting blood sugar 80-100  . Headache(784.0)     migranes  . Dizziness   . Carotid artery occlusion    Past Surgical History  Procedure Laterality Date  . Squamous cell removed    . Eye surgery      cataracts  . Hemorrhoid surgery    . Abdominal hysterectomy    . Throat surgery    . Tonsillectomy    . Colonoscopy w/ polypectomy    . Endarterectomy  09/01/2012    Procedure: ENDARTERECTOMY CAROTID;  Surgeon: Conrad Lane, MD;  Location: [facility];  Service: Vascular;  Laterality: Right;  . Patch angioplasty  09/01/2012      Procedure: PATCH ANGIOPLASTY;  Surgeon: Conrad Luquillo, MD;  Location: [facility];  Service: Vascular;  Laterality: Right;  Vascu-Guard Patch Angioplasty  . Carotid endarterectomy     Social History  Substance Use Topics  . Smoking status: Former Smoker    Types: Cigarettes    Quit date: 09/17/1987  . Smokeless tobacco: Never Used  . Alcohol Use: No   family history includes Breast cancer in her daughter; CAD in her father and mother; Cancer in her daughter; Depression in her mother; Diabetes in her brother and mother; Heart disease in her father and mother; Hyperlipidemia in her daughter; Hypertension in her son.  ROS as above Medications: Current Outpatient Prescriptions  Medication Sig Dispense Refill  . acetaminophen (TYLENOL ARTHRITIS PAIN) 650 MG CR tablet Take 650 mg by mouth every 8 (eight) hours as needed. For pain    . ADVAIR DISKUS 250-50 MCG/DOSE AEPB INHALE 1 PUFF TWICE DAILY 60 each 5  . alendronate (FOSAMAX) 70 MG tablet TAKE 1 TABLET BY MOUTH EVERY 7 DAYS ON AN EMPTY STOMACH WITH A FULL GLASS OF WATER (MUST KEEP FOLLOW UP APPOINTMENT) 4 tablet 0  . AMBULATORY NON FORMULARY MEDICATION Medication Name: Home continuous oxygen.  3 liters. Please provide portable prn.  Dx: COPD 1 Device prn  . AMBULATORY NON FORMULARY MEDICATION Medication Name: glucometer strips to test BID.  Dx. 250.00 100 Units PRN  . aspirin 81 MG tablet Take 81 mg by mouth daily.      . Calcium-Vitamin D (CALTRATE 600 PLUS-VIT D PO) Take 1 tablet by mouth daily.     [name] glipiZIDE (GLUCOTROL XL) 5 MG 24 hr tablet Take 1 tablet (5 mg total) by mouth daily. *Must keep follow up appointment.* 90 tablet 1  . LANTUS SOLOSTAR 100 UNIT/ML Solostar Pen INJECT 5 UNITS INTO THE SKIN AT BEDTIME 3 mL 2  . loratadine (CLARITIN) 10 MG tablet Take 1 tablet (10 mg total) by mouth daily. 30 tablet 11  . Multiple Vitamin (MULTIVITAMIN) tablet Take 1 tablet by mouth daily.      . naproxen (NAPROSYN) 500 MG tablet TAKE ONE TABLET  BY MOUTH 2 TIMES A DAY WITH A MEAL AS NEEDED FOR JAW & EAR PAIN 60 tablet 5  . nitroGLYCERIN (NITROSTAT) 0.4 MG SL tablet Place 1 tablet (0.4 mg total) under the tongue every 5 (five) minutes as needed. For chest pain 30 tablet 0  . QC LANCETS SUPER THIN MISC TEST TWICE DAILY 100 each 0  . sertraline (ZOLOFT) 100 MG tablet TAKE 1 & 1/2 TABLETS BY MOUTH EVERY DAY 45 tablet 5  . simvastatin (ZOCOR) 40 MG tablet TAKE ONE TABLET BY MOUTH AT BEDTIME 30 tablet 11  . SPIRIVA HANDIHALER 18 MCG inhalation capsule INHALE 1 CAPSULE BY MOUTH EVERY DAY 30 capsule 5  . SYNTHROID 75 MCG tablet Take 1 tablet (75 mcg total) by mouth daily before breakfast. 30 tablet 2  . traMADol (ULTRAM) 50 MG tablet TAKE 1 TABLET BY MOUTH DAILY AS NEEDED 30 tablet 0  . TRUETRACK TEST test strip TEST TWICE DAILY AS DIRECTED 100 each PRN  . UNIFINE PENTIPS 29G X 12MM MISC USE ONCE DAILY AS DIRECTED 100 each 0  . VENTOLIN HFA 108 (90 BASE) MCG/ACT inhaler INHALE 2 PUFFS INTO THE LUNGS EVERY 4 HOURS AS NEEDED FOR WHEEZING 18 g 3  . FLUoxetine (PROZAC) 10 MG capsule Take 2 capsules (20 mg total) by mouth daily. 180 capsule 0   No current facility-administered medications for this visit.   Allergies  Allergen Reactions  . Tetracyclines & Related Anaphylaxis  . Tetracycline     REACTION: arms swelling, itching, blisters     Exam:  BP 144/57 mmHg  Pulse 82  Wt 157 lb (71.215 kg) Gen: Well NAD Right hand and wrist are normal appearing without any skin changes. Capillary refill and pulses are intact. Normal motion.  Nerve conduction studies reviewed showing severe right-sided and moderate left-sided carpal tunnel syndrome  No results found for this or any previous visit (from the past 24 hour(s)). No results found.   Please see individual assessment and plan sections.

## 2015-09-22 LAB — LIPID PANEL
Cholesterol: 158 mg/dL (ref 0–200)
Cholesterol: 158 mg/dL (ref 0–200)
HDL: 87 mg/dL — AB (ref 35–70)
HDL: 87 mg/dL — AB (ref 35–70)
LDL Cholesterol: 37 mg/dL
LDL Cholesterol: 37 mg/dL
LDL/HDL RATIO: 1.8
LDl/HDL Ratio: 1.8
Triglycerides: 172 mg/dL — AB (ref 40–160)
Triglycerides: 172 mg/dL — AB (ref 40–160)

## 2015-09-22 LAB — HEMOGLOBIN A1C: Hemoglobin A1C: 5.3

## 2015-09-22 NOTE — Telephone Encounter (Signed)
Was this patient ever called? This was from 3 weeks ago.  Please call her.  I don't see any documentation.

## 2015-09-25 DIAGNOSIS — E119 Type 2 diabetes mellitus without complications: Secondary | ICD-10-CM | POA: Diagnosis not present

## 2015-09-25 DIAGNOSIS — N3941 Urge incontinence: Secondary | ICD-10-CM | POA: Diagnosis not present

## 2015-09-25 DIAGNOSIS — J449 Chronic obstructive pulmonary disease, unspecified: Secondary | ICD-10-CM | POA: Diagnosis not present

## 2015-09-26 NOTE — Telephone Encounter (Signed)
Patient has been seen by Dr Georgina Snell twice for this problem.

## 2015-09-28 ENCOUNTER — Other Ambulatory Visit: Payer: Self-pay | Admitting: Family Medicine

## 2015-10-09 DIAGNOSIS — J45909 Unspecified asthma, uncomplicated: Secondary | ICD-10-CM | POA: Diagnosis not present

## 2015-10-09 DIAGNOSIS — Q348 Other specified congenital malformations of respiratory system: Secondary | ICD-10-CM | POA: Diagnosis not present

## 2015-10-11 ENCOUNTER — Telehealth: Payer: Self-pay | Admitting: Emergency Medicine

## 2015-10-11 DIAGNOSIS — M25531 Pain in right wrist: Secondary | ICD-10-CM | POA: Diagnosis not present

## 2015-10-11 DIAGNOSIS — M25532 Pain in left wrist: Secondary | ICD-10-CM | POA: Diagnosis not present

## 2015-10-11 DIAGNOSIS — G5601 Carpal tunnel syndrome, right upper limb: Secondary | ICD-10-CM | POA: Diagnosis not present

## 2015-10-17 ENCOUNTER — Other Ambulatory Visit: Payer: Self-pay | Admitting: Family Medicine

## 2015-10-17 ENCOUNTER — Telehealth: Payer: Self-pay | Admitting: Family Medicine

## 2015-10-17 NOTE — Telephone Encounter (Signed)
I called and left a message to just let patient know that she is due for a office visit so she can get her meds refilled. I see she has appointment scheduled for a CPE on 10-24-15 and to let her know she should keep that appointment time so she can get her refills.  Thanks, Oswaldo Milian

## 2015-10-23 DIAGNOSIS — L723 Sebaceous cyst: Secondary | ICD-10-CM | POA: Diagnosis not present

## 2015-10-24 ENCOUNTER — Encounter: Payer: Commercial Managed Care - HMO | Admitting: Family Medicine

## 2015-10-24 ENCOUNTER — Encounter: Payer: Self-pay | Admitting: Family Medicine

## 2015-10-24 DIAGNOSIS — J449 Chronic obstructive pulmonary disease, unspecified: Secondary | ICD-10-CM | POA: Diagnosis not present

## 2015-10-24 DIAGNOSIS — G5601 Carpal tunnel syndrome, right upper limb: Secondary | ICD-10-CM | POA: Diagnosis not present

## 2015-10-24 DIAGNOSIS — I252 Old myocardial infarction: Secondary | ICD-10-CM | POA: Diagnosis not present

## 2015-10-24 DIAGNOSIS — Z9981 Dependence on supplemental oxygen: Secondary | ICD-10-CM | POA: Diagnosis not present

## 2015-10-24 DIAGNOSIS — Z87891 Personal history of nicotine dependence: Secondary | ICD-10-CM | POA: Diagnosis not present

## 2015-10-24 DIAGNOSIS — J45909 Unspecified asthma, uncomplicated: Secondary | ICD-10-CM | POA: Diagnosis not present

## 2015-10-24 DIAGNOSIS — Z794 Long term (current) use of insulin: Secondary | ICD-10-CM | POA: Diagnosis not present

## 2015-10-24 DIAGNOSIS — E1151 Type 2 diabetes mellitus with diabetic peripheral angiopathy without gangrene: Secondary | ICD-10-CM | POA: Diagnosis not present

## 2015-10-24 DIAGNOSIS — I10 Essential (primary) hypertension: Secondary | ICD-10-CM | POA: Diagnosis not present

## 2015-10-26 DIAGNOSIS — N3941 Urge incontinence: Secondary | ICD-10-CM | POA: Diagnosis not present

## 2015-10-26 DIAGNOSIS — E119 Type 2 diabetes mellitus without complications: Secondary | ICD-10-CM | POA: Diagnosis not present

## 2015-10-26 DIAGNOSIS — J449 Chronic obstructive pulmonary disease, unspecified: Secondary | ICD-10-CM | POA: Diagnosis not present

## 2015-10-31 ENCOUNTER — Other Ambulatory Visit: Payer: Self-pay | Admitting: Family Medicine

## 2015-11-02 ENCOUNTER — Encounter: Payer: Self-pay | Admitting: Family Medicine

## 2015-11-03 ENCOUNTER — Encounter: Payer: Self-pay | Admitting: Family Medicine

## 2015-11-06 ENCOUNTER — Encounter: Payer: Commercial Managed Care - HMO | Admitting: Family Medicine

## 2015-11-09 DIAGNOSIS — J45909 Unspecified asthma, uncomplicated: Secondary | ICD-10-CM | POA: Diagnosis not present

## 2015-11-09 DIAGNOSIS — Q348 Other specified congenital malformations of respiratory system: Secondary | ICD-10-CM | POA: Diagnosis not present

## 2015-11-13 ENCOUNTER — Other Ambulatory Visit: Payer: Self-pay | Admitting: Family Medicine

## 2015-11-14 NOTE — Telephone Encounter (Signed)
Needs appointment before future refills.

## 2015-11-18 DIAGNOSIS — S50852D Superficial foreign body of left forearm, subsequent encounter: Secondary | ICD-10-CM | POA: Insufficient documentation

## 2015-11-21 DIAGNOSIS — E039 Hypothyroidism, unspecified: Secondary | ICD-10-CM | POA: Diagnosis not present

## 2015-11-21 DIAGNOSIS — Z794 Long term (current) use of insulin: Secondary | ICD-10-CM | POA: Diagnosis not present

## 2015-11-21 DIAGNOSIS — J449 Chronic obstructive pulmonary disease, unspecified: Secondary | ICD-10-CM | POA: Diagnosis not present

## 2015-11-21 DIAGNOSIS — I252 Old myocardial infarction: Secondary | ICD-10-CM | POA: Diagnosis not present

## 2015-11-21 DIAGNOSIS — I129 Hypertensive chronic kidney disease with stage 1 through stage 4 chronic kidney disease, or unspecified chronic kidney disease: Secondary | ICD-10-CM | POA: Diagnosis not present

## 2015-11-21 DIAGNOSIS — M67432 Ganglion, left wrist: Secondary | ICD-10-CM | POA: Diagnosis not present

## 2015-11-21 DIAGNOSIS — G5602 Carpal tunnel syndrome, left upper limb: Secondary | ICD-10-CM | POA: Diagnosis not present

## 2015-11-21 DIAGNOSIS — E1122 Type 2 diabetes mellitus with diabetic chronic kidney disease: Secondary | ICD-10-CM | POA: Diagnosis not present

## 2015-11-21 DIAGNOSIS — D1722 Benign lipomatous neoplasm of skin and subcutaneous tissue of left arm: Secondary | ICD-10-CM | POA: Diagnosis not present

## 2015-11-21 DIAGNOSIS — N183 Chronic kidney disease, stage 3 (moderate): Secondary | ICD-10-CM | POA: Diagnosis not present

## 2015-11-23 ENCOUNTER — Other Ambulatory Visit: Payer: Self-pay | Admitting: Family Medicine

## 2015-11-23 NOTE — Telephone Encounter (Signed)
Call pt: will change simvastatin 20mg .  New rx sent.

## 2015-11-23 NOTE — Telephone Encounter (Signed)
Can you please look at the patient's results form 1/17? It may not have may it to you inbasket. But she has a pending refill and wanted you to check this out first. Thanks

## 2015-11-27 DIAGNOSIS — N3941 Urge incontinence: Secondary | ICD-10-CM | POA: Diagnosis not present

## 2015-11-27 DIAGNOSIS — E119 Type 2 diabetes mellitus without complications: Secondary | ICD-10-CM | POA: Diagnosis not present

## 2015-11-27 DIAGNOSIS — J449 Chronic obstructive pulmonary disease, unspecified: Secondary | ICD-10-CM | POA: Diagnosis not present

## 2015-11-27 NOTE — Telephone Encounter (Signed)
Spoke to daughter and notified her of results

## 2015-12-02 ENCOUNTER — Other Ambulatory Visit: Payer: Self-pay | Admitting: Family Medicine

## 2015-12-03 DIAGNOSIS — E119 Type 2 diabetes mellitus without complications: Secondary | ICD-10-CM | POA: Diagnosis not present

## 2015-12-03 DIAGNOSIS — Z881 Allergy status to other antibiotic agents status: Secondary | ICD-10-CM | POA: Diagnosis not present

## 2015-12-03 DIAGNOSIS — S61211A Laceration without foreign body of left index finger without damage to nail, initial encounter: Secondary | ICD-10-CM | POA: Diagnosis not present

## 2015-12-03 DIAGNOSIS — Z87891 Personal history of nicotine dependence: Secondary | ICD-10-CM | POA: Diagnosis not present

## 2015-12-03 DIAGNOSIS — S61452A Open bite of left hand, initial encounter: Secondary | ICD-10-CM | POA: Diagnosis not present

## 2015-12-03 DIAGNOSIS — S61412A Laceration without foreign body of left hand, initial encounter: Secondary | ICD-10-CM | POA: Diagnosis not present

## 2015-12-03 DIAGNOSIS — E079 Disorder of thyroid, unspecified: Secondary | ICD-10-CM | POA: Diagnosis not present

## 2015-12-03 DIAGNOSIS — I252 Old myocardial infarction: Secondary | ICD-10-CM | POA: Diagnosis not present

## 2015-12-03 DIAGNOSIS — J45909 Unspecified asthma, uncomplicated: Secondary | ICD-10-CM | POA: Diagnosis not present

## 2015-12-03 DIAGNOSIS — W540XXA Bitten by dog, initial encounter: Secondary | ICD-10-CM | POA: Diagnosis not present

## 2015-12-03 DIAGNOSIS — J449 Chronic obstructive pulmonary disease, unspecified: Secondary | ICD-10-CM | POA: Diagnosis not present

## 2015-12-05 ENCOUNTER — Encounter: Payer: Self-pay | Admitting: Family Medicine

## 2015-12-05 ENCOUNTER — Ambulatory Visit (INDEPENDENT_AMBULATORY_CARE_PROVIDER_SITE_OTHER): Payer: Commercial Managed Care - HMO | Admitting: Family Medicine

## 2015-12-05 ENCOUNTER — Ambulatory Visit (INDEPENDENT_AMBULATORY_CARE_PROVIDER_SITE_OTHER): Payer: Commercial Managed Care - HMO

## 2015-12-05 VITALS — BP 110/57 | HR 102 | Wt 151.0 lb

## 2015-12-05 DIAGNOSIS — X58XXXA Exposure to other specified factors, initial encounter: Secondary | ICD-10-CM

## 2015-12-05 DIAGNOSIS — Z23 Encounter for immunization: Secondary | ICD-10-CM

## 2015-12-05 DIAGNOSIS — S069X9A Unspecified intracranial injury with loss of consciousness of unspecified duration, initial encounter: Secondary | ICD-10-CM | POA: Diagnosis not present

## 2015-12-05 DIAGNOSIS — S61452A Open bite of left hand, initial encounter: Secondary | ICD-10-CM

## 2015-12-05 DIAGNOSIS — S0093XA Contusion of unspecified part of head, initial encounter: Secondary | ICD-10-CM

## 2015-12-05 DIAGNOSIS — R220 Localized swelling, mass and lump, head: Secondary | ICD-10-CM | POA: Diagnosis not present

## 2015-12-05 DIAGNOSIS — S61459A Open bite of unspecified hand, initial encounter: Secondary | ICD-10-CM | POA: Insufficient documentation

## 2015-12-05 DIAGNOSIS — W540XXA Bitten by dog, initial encounter: Secondary | ICD-10-CM

## 2015-12-05 NOTE — Progress Notes (Signed)
Quick Note:  No brain bleeds. Ct looks ok. F/u with Dr Madilyn Fireman ______

## 2015-12-05 NOTE — Assessment & Plan Note (Signed)
Doing well. Dressing applied. Continue Augmentin. Remove sutures in about one week

## 2015-12-05 NOTE — Patient Instructions (Signed)
Thank you for coming in today. Get head CT now.  Follow up with Dr Madilyn Fireman next week.  Stop Norco for pain.  Continue augmentin for infection.   Animal Bite Animal bites can range from mild to serious. An animal bite can result in a scratch on the skin, a deep open cut, a puncture of the skin, a crush injury, or tearing away of the skin or a body part. A small bite from a house pet will usually not cause serious problems. However, some animal bites can become infected or injure a bone or other tissue.  Bites from certain animals can be more dangerous because of the risk of spreading rabies, which is a serious viral infection. This risk is higher with bites from stray animals or wild animals, such as raccoons, foxes, skunks, and bats. Dogs are responsible for most animal bites. Children are bitten more often than adults. SYMPTOMS  Common symptoms of an animal bite include:   Pain.   Bleeding.   Swelling.   Bruising.  DIAGNOSIS  This condition may be diagnosed based on a physical exam and medical history. Your health care provider will examine the wound and ask for details about the animal and how the bite happened. You may also have tests, such as:   Blood tests to check for infection or to determine if surgery is needed.  X-rays to check for damage to bones or joints.  Culture test. This uses a sample of fluid from the wound to check for infection. TREATMENT  Treatment varies depending on the location and type of animal bite and your medical history. Treatment may include:   Wound care. This often includes cleaning the wound, flushing the wound with saline solution, and applying a bandage (dressing). Sometimes, the wound is left open to heal because of the high risk of infection. However, in some cases, the wound may be closed with stitches (sutures), staples, skin glue, or adhesive strips.   Antibiotic medicine.   Tetanus shot.   Rabies treatment if the animal could have  rabies.  In some cases, bites that have become infected may require IV antibiotics and surgical treatment in the hospital.  Theresa Huang  Follow instructions from your health care provider about how to take care of your wound. Make sure you:  Wash your hands with soap and water before you change your dressing. If soap and water are not available, use hand sanitizer.  Change your dressing as told by your health care provider.  Leave sutures, skin glue, or adhesive strips in place. These skin closures may need to be in place for 2 weeks or longer. If adhesive strip edges start to loosen and curl up, you may trim the loose edges. Do not remove adhesive strips completely unless your health care provider tells you to do that.  Check your wound every day for signs of infection. Watch for:   Increasing redness, swelling, or pain.   Fluid, blood, or pus.  General Instructions  Take or apply over-the-counter and prescription medicines only as told by your health care provider.   If you were prescribed an antibiotic, take or apply it as told by your health care provider. Do not stop using the antibiotic even if your condition improves.   Keep the injured area raised (elevated) above the level of your heart while you are sitting or lying down, if this is possible.   If directed, apply ice to the injured area.   Put ice  in a plastic bag.   Place a towel between your skin and the bag.   Leave the ice on for 20 minutes, 2-3 times per day.   Keep all follow-up visits as told by your health care provider. This is important.  SEEK MEDICAL CARE IF:  You have increasing redness, swelling, or pain at the site of your wound.   You have a general feeling of sickness (malaise).   You feel nauseous or you vomit.   You have pain that does not get better.  SEEK IMMEDIATE MEDICAL CARE IF:  You have a red streak extending away from your wound.   You  have fluid, blood, or pus coming from your wound.   You have a fever or chills.   You have trouble moving your injured area.   You have numbness or tingling extending beyond the wound.   This information is not intended to replace advice given to you by your health care provider. Make sure you discuss any questions you have with your health care provider.   Document Released: 05/21/2011 Document Revised: 05/24/2015 Document Reviewed: 01/18/2015 Elsevier Interactive Patient Education [employer].

## 2015-12-05 NOTE — Assessment & Plan Note (Signed)
Doing well. Normal neurologic exam. Obtain stat noncontrast CT scan of the head. Follow-up with PCP in about a week.

## 2015-12-05 NOTE — Progress Notes (Signed)
Theresa Huang is a 82 y.o. female who presents to [facility] today for right and fall.  Patient was recently seen in the emergency room for a dog bite involving her left hand. She was bitten on the left hand by her dog 2 days ago while feeding the dog. The dogs are up-to-date with vaccines. She was seen in the emergency department. The wounds were irrigated cleaned up and loosely sutured. At the time she saw her last tetanus vaccine was about 4 years ago. She was not given a tetanus vaccine. She was prescribed Norco and Augmentin. She has been taking both.   She was feeling pretty well this morning and went to the bathroom. She fell and hit her head on the bathtub this morning. She's not sure why she fell she does sure she tripped over something or just fell over. She denies any loss of consciousness or palpitations or chest pain prior to the fall. She notes her blood sugar was normal following the fall at around 100 .  She notes that she's been a little bit dizzy and lightheaded recently.   Past Medical History  Diagnosis Date  . DDD (degenerative disc disease)   . Inflammatory polyps of colon with rectal bleeding ([place])   . Hemorrhoids   . Retinopathy of left eye   . Hearing loss of both ears     wears bilateral hearing aids  . Glaucoma   . Diastolic dysfunction   . Hyperlipidemia   . Hypothyroid   . Anemia   . Irregular heart beat   . Cancer ([place])     SCC  . Holter monitor, abnormal     wearing for 21 days  . Syncope   . Myocardial infarction ([place]) 2006  . Pneumonia     hx  . Shortness of breath     most of time  . COPD (chronic obstructive pulmonary disease) ([facility])     wears oxygen at 3L prn daytime and 3L at night   . Diabetes mellitus ([facility])     fasting blood sugar 80-100  . Headache(784.0)     migranes  . Dizziness   . Carotid artery occlusion    Past Surgical  History  Procedure Laterality Date  . Squamous cell removed    . Eye surgery      cataracts  . Hemorrhoid surgery    . Abdominal hysterectomy    . Throat surgery    . Tonsillectomy    . Colonoscopy w/ polypectomy    . Endarterectomy  09/01/2012    Procedure: ENDARTERECTOMY CAROTID;  Surgeon: Conrad Pajaros, MD;  Location: [facility];  Service: Vascular;  Laterality: Right;  . Patch angioplasty  09/01/2012    Procedure: PATCH ANGIOPLASTY;  Surgeon: Conrad [place] Clara, MD;  Location: [facility];  Service: Vascular;  Laterality: Right;  Vascu-Guard Patch Angioplasty  . Carotid endarterectomy     Social History  Substance Use Topics  . Smoking status: Former Smoker    Types: Cigarettes    Quit date: 09/17/1987  . Smokeless tobacco: Never Used  . Alcohol Use: No   family history includes Breast cancer in her daughter; CAD in her father and mother; Cancer in her daughter; Depression in her mother; Diabetes in her brother and mother; Heart disease in her father and mother; Hyperlipidemia in her daughter; Hypertension in her son.  ROS as above Medications: Current Outpatient Prescriptions  Medication Sig Dispense Refill  .  acetaminophen (TYLENOL ARTHRITIS PAIN) 650 MG CR tablet Take 650 mg by mouth every 8 (eight) hours as needed. For pain    . ADVAIR DISKUS 250-50 MCG/DOSE AEPB INHALE 1 PUFF TWICE DAILY 60 each 0  . alendronate (FOSAMAX) 70 MG tablet TAKE 1 TABLET ONCE A WEEK *NEEDS APPOINTMENT FOR MORE REFILLS* 2 tablet 0  . AMBULATORY NON FORMULARY MEDICATION Medication Name: Home continuous oxygen.  3 liters. Please provide portable prn. Dx: COPD 1 Device prn  . AMBULATORY NON FORMULARY MEDICATION Medication Name: glucometer strips to test BID.  Dx. 250.00 100 Units PRN  . amoxicillin-clavulanate (AUGMENTIN) 875-125 MG tablet Take by mouth.    [name] aspirin 81 MG tablet Take 81 mg by mouth daily.      . Calcium-Vitamin D (CALTRATE 600 PLUS-VIT D PO) Take 1 tablet by mouth daily.     [name] FLUoxetine  (PROZAC) 10 MG capsule Take 2 capsules (20 mg total) by mouth daily. 180 capsule 0  . glipiZIDE (GLUCOTROL XL) 5 MG 24 hr tablet Take 1 tablet (5 mg total) by mouth daily. *Must keep follow up appointment.* 90 tablet 1  . HYDROcodone-acetaminophen (NORCO/VICODIN) 5-325 MG tablet Take by mouth.    [name] LANTUS SOLOSTAR 100 UNIT/ML Solostar Pen INJECT 5 UNITS INTO THE SKIN AT BEDTIME 3 mL 0  . loratadine (CLARITIN) 10 MG tablet Take 1 tablet (10 mg total) by mouth daily. 30 tablet 11  . Multiple Vitamin (MULTIVITAMIN) tablet Take 1 tablet by mouth daily.      . naproxen (NAPROSYN) 500 MG tablet TAKE ONE TABLET BY MOUTH 2 TIMES A DAY WITH A MEAL AS NEEDED FOR JAW & EAR PAIN 60 tablet 0  . nitroGLYCERIN (NITROSTAT) 0.4 MG SL tablet Place 1 tablet (0.4 mg total) under the tongue every 5 (five) minutes as needed. For chest pain 30 tablet 0  . QC LANCETS SUPER THIN MISC TEST TWICE DAILY 100 each 0  . sertraline (ZOLOFT) 100 MG tablet TAKE 1 & 1/2 TABLETS BY MOUTH EVERY DAY 45 tablet 5  . simvastatin (ZOCOR) 20 MG tablet Take 1 tablet (20 mg total) by mouth at bedtime. 90 tablet 3  . SPIRIVA HANDIHALER 18 MCG inhalation capsule INHALE 1 CAPSULE BY MOUTH EVERY DAY 30 capsule 0  . SYNTHROID 75 MCG tablet TAKE ONE TABLET BY MOUTH EVERY DAY BEFORE BREAKFAST 30 tablet 0  . traMADol (ULTRAM) 50 MG tablet TAKE 1 TABLET BY MOUTH DAILY AS NEEDED *NEEDS APPOINTMENT FOR MORE REFILLS* 15 tablet 0  . TRUETRACK TEST test strip TEST TWICE DAILY AS DIRECTED 100 each PRN  . UNIFINE PENTIPS 29G X 12MM MISC USE ONCE DAILY AS DIRECTED 100 each 0  . VENTOLIN HFA 108 (90 Base) MCG/ACT inhaler INHALE 2 PUFFS INTO THE LUNGS EVERY 4 HOURS AS NEEDED FOR WHEEZING 18 g 0   No current facility-administered medications for this visit.   Allergies  Allergen Reactions  . Tetracyclines & Related Anaphylaxis  . Tetracycline     REACTION: arms swelling, itching, blisters     Exam:  BP 110/57 mmHg  Pulse 102  Wt 151 lb (68.493  kg) Gen: Well NAD Nontoxic appearing HEENT: EOMI,  MMM. Large forehead contusion.  Lungs: Normal work of breathing. CTABL Heart: RRR no MRG Abd: NABS, Soft. Nondistended, Nontender Exts: Brisk capillary refill, warm and well perfused.  Neck nontender normal motion Neuro alert and oriented normal speech thought process normal coordination. Patient is able to stand from a seated position and ambulate. Skin: Left  hand well-appearing lacerations to the skin overlying the palmar second MCP and middle phalanx. 2 simple interrupted sutures used to loosely approximate the skin edges. Wounds are nontender with no discharge or erythema.  Tdap given prior to discharge. Last tetanus vaccine July 2007   No results found for this or any previous visit (from the past 24 hour(s)). No results found.   Please see individual assessment and plan sections.

## 2015-12-07 DIAGNOSIS — J45909 Unspecified asthma, uncomplicated: Secondary | ICD-10-CM | POA: Diagnosis not present

## 2015-12-07 DIAGNOSIS — Q348 Other specified congenital malformations of respiratory system: Secondary | ICD-10-CM | POA: Diagnosis not present

## 2015-12-12 ENCOUNTER — Ambulatory Visit (INDEPENDENT_AMBULATORY_CARE_PROVIDER_SITE_OTHER): Payer: Commercial Managed Care - HMO | Admitting: Family Medicine

## 2015-12-12 VITALS — BP 152/71 | HR 82 | Wt 152.0 lb

## 2015-12-12 DIAGNOSIS — Z4802 Encounter for removal of sutures: Secondary | ICD-10-CM

## 2015-12-12 DIAGNOSIS — H6123 Impacted cerumen, bilateral: Secondary | ICD-10-CM

## 2015-12-12 NOTE — Progress Notes (Signed)
Patient went to have her hearing aid checked and they told her that both ears were full of wax and she needed to follow-up with her PCP to have the wax removed. Irrigation performed today. Patient tolerated well. E ears dry out before using the hearing aid again.  Were unable to remove the suture from her finger today. She had a bandage on it but evidently got wet and the skin was a bit macerated. Recommend give it a couple days without a bandage before taking the suture out. The one from the palm was easily removed today. Keep follow-up for appointment tomorrow.  Beatrice Lecher, MD

## 2015-12-12 NOTE — Progress Notes (Signed)
Theresa Huang is here for suture removal from left hand.   She is complaining of wax build up in right ear.    One suture removed from left hand. Finger is to moist to remove suture.   Wax cleaned out bilateral ears. Patient tolerated procedure well without complications.

## 2015-12-13 ENCOUNTER — Ambulatory Visit: Payer: Commercial Managed Care - HMO | Admitting: Family Medicine

## 2015-12-18 ENCOUNTER — Ambulatory Visit (INDEPENDENT_AMBULATORY_CARE_PROVIDER_SITE_OTHER): Payer: Commercial Managed Care - HMO | Admitting: Family Medicine

## 2015-12-18 ENCOUNTER — Other Ambulatory Visit: Payer: Self-pay | Admitting: Family Medicine

## 2015-12-18 DIAGNOSIS — S61412A Laceration without foreign body of left hand, initial encounter: Secondary | ICD-10-CM

## 2015-12-18 NOTE — Progress Notes (Signed)
Incisions are healing well. They're clean dry and intact. Encouraged her to keep them moisturized.  I also encouraged her to make follow-up appoint for her diabetes as she missed her appointment last week.  Beatrice Lecher, MD

## 2015-12-18 NOTE — Progress Notes (Signed)
Theresa Huang was here to have 1 suture removed for her first finger left hand.   Suture site has no drainage or swelling. Suture removed.

## 2015-12-25 ENCOUNTER — Encounter: Payer: Self-pay | Admitting: Family Medicine

## 2015-12-25 ENCOUNTER — Ambulatory Visit (INDEPENDENT_AMBULATORY_CARE_PROVIDER_SITE_OTHER): Payer: Commercial Managed Care - HMO | Admitting: Family Medicine

## 2015-12-25 DIAGNOSIS — Z794 Long term (current) use of insulin: Secondary | ICD-10-CM | POA: Diagnosis not present

## 2015-12-25 DIAGNOSIS — E038 Other specified hypothyroidism: Secondary | ICD-10-CM | POA: Diagnosis not present

## 2015-12-25 DIAGNOSIS — E113299 Type 2 diabetes mellitus with mild nonproliferative diabetic retinopathy without macular edema, unspecified eye: Secondary | ICD-10-CM | POA: Diagnosis not present

## 2015-12-25 DIAGNOSIS — R42 Dizziness and giddiness: Secondary | ICD-10-CM

## 2015-12-25 DIAGNOSIS — Y92009 Unspecified place in unspecified non-institutional (private) residence as the place of occurrence of the external cause: Secondary | ICD-10-CM

## 2015-12-25 DIAGNOSIS — E118 Type 2 diabetes mellitus with unspecified complications: Secondary | ICD-10-CM

## 2015-12-25 DIAGNOSIS — W19XXXS Unspecified fall, sequela: Secondary | ICD-10-CM

## 2015-12-25 LAB — POCT UA - MICROALBUMIN
CREATININE, POC: 200 mg/dL
MICROALBUMIN (UR) POC: 30 mg/L

## 2015-12-25 LAB — BASIC METABOLIC PANEL WITH GFR
BUN: 45 mg/dL — AB (ref 7–25)
CO2: 25 mmol/L (ref 20–31)
Calcium: 9.6 mg/dL (ref 8.6–10.4)
Chloride: 102 mmol/L (ref 98–110)
Creat: 1.47 mg/dL — ABNORMAL HIGH (ref 0.60–0.88)
GFR, EST AFRICAN AMERICAN: 38 mL/min — AB (ref >=60)
GFR, EST NON AFRICAN AMERICAN: 33 mL/min — AB (ref >=60)
GLUCOSE: 241 mg/dL — AB (ref 65–99)
Potassium: 4.5 mmol/L (ref 3.5–5.3)
Sodium: 137 mmol/L (ref 135–146)

## 2015-12-25 LAB — POCT GLYCOSYLATED HEMOGLOBIN (HGB A1C): Hemoglobin A1C: 5.9

## 2015-12-25 LAB — TSH: TSH: 4.1 mIU/L

## 2015-12-25 MED ORDER — INSULIN GLARGINE 100 UNIT/ML SOLOSTAR PEN: PEN_INJECTOR | SUBCUTANEOUS | Status: DC

## 2015-12-25 NOTE — Progress Notes (Signed)
   Subjective:    Patient ID: Theresa Huang, female    DOB: 11/14/1930, 82 y.o.   MRN: [id number]  HPI Diabetes - no hypoglycemic events. No wounds or sores that are not healing well. No increased thirst or urination. Checking glucose at home. Taking medications as prescribed without any side effects.  Asthma/COPD - She reports that she's been using her Advair daily and says she's been using her albuterol once or twice a day since the increase in pollen. She denies feeling short of breath or any wheezing today.  Hypothyroidism - last TSH was mildly elevated in September. She was supposed to recheck it in 6 weeks adm make sure taking med consistantly. She never went for f/u.   She's also concerned because she's been feeling a little off balance. She says even standing still just a like she's starting to sway. She has had some falls in the past year as well. In fact she tripped and hit her head about 2 weeks ago and was seen in our office.   Review of Systems     Objective:   Physical Exam  Constitutional: She is oriented to person, place, and time. She appears well-developed and well-nourished.  HENT:  Head: Normocephalic and atraumatic.  Cardiovascular: Normal rate, regular rhythm and normal heart sounds.   Pulmonary/Chest: Effort normal and breath sounds normal.  Neurological: She is alert and oriented to person, place, and time.  Skin: Skin is warm and dry.  Psychiatric: She has a normal mood and affect. Her behavior is normal.          Assessment & Plan:  DM - Well controlled. Continue current regimen. Follow-up in 3 months.  Foot exam performed today.   Asthma/COPD - Continue Advair daily. Only use albuterol if needed. If she continues to use it frequently then please let me know or she suddenly gets worse.  Hypothyroid - recheck thyroid level.  Imbalance/fall-recommend home physical therapy to help her with using assistive devices and potentially doing some therapy for  vertigo as well.

## 2015-12-26 ENCOUNTER — Other Ambulatory Visit: Payer: Self-pay | Admitting: Family Medicine

## 2015-12-26 MED ORDER — ALENDRONATE SODIUM 70 MG PO TABS: ORAL_TABLET | ORAL | Status: DC

## 2015-12-27 DIAGNOSIS — F329 Major depressive disorder, single episode, unspecified: Secondary | ICD-10-CM | POA: Diagnosis not present

## 2015-12-27 DIAGNOSIS — M545 Low back pain: Secondary | ICD-10-CM | POA: Diagnosis not present

## 2015-12-27 DIAGNOSIS — I10 Essential (primary) hypertension: Secondary | ICD-10-CM | POA: Diagnosis not present

## 2015-12-27 DIAGNOSIS — E1140 Type 2 diabetes mellitus with diabetic neuropathy, unspecified: Secondary | ICD-10-CM | POA: Diagnosis not present

## 2015-12-27 DIAGNOSIS — J449 Chronic obstructive pulmonary disease, unspecified: Secondary | ICD-10-CM | POA: Diagnosis not present

## 2015-12-27 DIAGNOSIS — I252 Old myocardial infarction: Secondary | ICD-10-CM | POA: Diagnosis not present

## 2015-12-27 DIAGNOSIS — W19XXXS Unspecified fall, sequela: Secondary | ICD-10-CM | POA: Diagnosis not present

## 2015-12-27 DIAGNOSIS — M6281 Muscle weakness (generalized): Secondary | ICD-10-CM | POA: Diagnosis not present

## 2015-12-27 DIAGNOSIS — E1122 Type 2 diabetes mellitus with diabetic chronic kidney disease: Secondary | ICD-10-CM | POA: Diagnosis not present

## 2015-12-27 DIAGNOSIS — N183 Chronic kidney disease, stage 3 (moderate): Secondary | ICD-10-CM | POA: Diagnosis not present

## 2015-12-28 DIAGNOSIS — N3941 Urge incontinence: Secondary | ICD-10-CM | POA: Diagnosis not present

## 2015-12-28 DIAGNOSIS — J449 Chronic obstructive pulmonary disease, unspecified: Secondary | ICD-10-CM | POA: Diagnosis not present

## 2015-12-28 DIAGNOSIS — E119 Type 2 diabetes mellitus without complications: Secondary | ICD-10-CM | POA: Diagnosis not present

## 2015-12-29 DIAGNOSIS — N183 Chronic kidney disease, stage 3 (moderate): Secondary | ICD-10-CM | POA: Diagnosis not present

## 2015-12-29 DIAGNOSIS — M6281 Muscle weakness (generalized): Secondary | ICD-10-CM | POA: Diagnosis not present

## 2015-12-29 DIAGNOSIS — F329 Major depressive disorder, single episode, unspecified: Secondary | ICD-10-CM | POA: Diagnosis not present

## 2015-12-29 DIAGNOSIS — J449 Chronic obstructive pulmonary disease, unspecified: Secondary | ICD-10-CM | POA: Diagnosis not present

## 2015-12-29 DIAGNOSIS — E1140 Type 2 diabetes mellitus with diabetic neuropathy, unspecified: Secondary | ICD-10-CM | POA: Diagnosis not present

## 2015-12-29 DIAGNOSIS — I252 Old myocardial infarction: Secondary | ICD-10-CM | POA: Diagnosis not present

## 2015-12-29 DIAGNOSIS — M545 Low back pain: Secondary | ICD-10-CM | POA: Diagnosis not present

## 2015-12-29 DIAGNOSIS — W19XXXS Unspecified fall, sequela: Secondary | ICD-10-CM | POA: Diagnosis not present

## 2015-12-29 DIAGNOSIS — E1122 Type 2 diabetes mellitus with diabetic chronic kidney disease: Secondary | ICD-10-CM | POA: Diagnosis not present

## 2016-01-03 DIAGNOSIS — M545 Low back pain: Secondary | ICD-10-CM | POA: Diagnosis not present

## 2016-01-03 DIAGNOSIS — I252 Old myocardial infarction: Secondary | ICD-10-CM | POA: Diagnosis not present

## 2016-01-03 DIAGNOSIS — E1122 Type 2 diabetes mellitus with diabetic chronic kidney disease: Secondary | ICD-10-CM | POA: Diagnosis not present

## 2016-01-03 DIAGNOSIS — M6281 Muscle weakness (generalized): Secondary | ICD-10-CM | POA: Diagnosis not present

## 2016-01-03 DIAGNOSIS — E1140 Type 2 diabetes mellitus with diabetic neuropathy, unspecified: Secondary | ICD-10-CM | POA: Diagnosis not present

## 2016-01-03 DIAGNOSIS — J449 Chronic obstructive pulmonary disease, unspecified: Secondary | ICD-10-CM | POA: Diagnosis not present

## 2016-01-03 DIAGNOSIS — N183 Chronic kidney disease, stage 3 (moderate): Secondary | ICD-10-CM | POA: Diagnosis not present

## 2016-01-03 DIAGNOSIS — F329 Major depressive disorder, single episode, unspecified: Secondary | ICD-10-CM | POA: Diagnosis not present

## 2016-01-03 DIAGNOSIS — W19XXXS Unspecified fall, sequela: Secondary | ICD-10-CM | POA: Diagnosis not present

## 2016-01-05 DIAGNOSIS — E1122 Type 2 diabetes mellitus with diabetic chronic kidney disease: Secondary | ICD-10-CM | POA: Diagnosis not present

## 2016-01-05 DIAGNOSIS — N183 Chronic kidney disease, stage 3 (moderate): Secondary | ICD-10-CM | POA: Diagnosis not present

## 2016-01-05 DIAGNOSIS — F329 Major depressive disorder, single episode, unspecified: Secondary | ICD-10-CM | POA: Diagnosis not present

## 2016-01-05 DIAGNOSIS — E1140 Type 2 diabetes mellitus with diabetic neuropathy, unspecified: Secondary | ICD-10-CM | POA: Diagnosis not present

## 2016-01-05 DIAGNOSIS — M545 Low back pain: Secondary | ICD-10-CM | POA: Diagnosis not present

## 2016-01-05 DIAGNOSIS — I252 Old myocardial infarction: Secondary | ICD-10-CM | POA: Diagnosis not present

## 2016-01-05 DIAGNOSIS — M6281 Muscle weakness (generalized): Secondary | ICD-10-CM | POA: Diagnosis not present

## 2016-01-05 DIAGNOSIS — W19XXXS Unspecified fall, sequela: Secondary | ICD-10-CM | POA: Diagnosis not present

## 2016-01-05 DIAGNOSIS — J449 Chronic obstructive pulmonary disease, unspecified: Secondary | ICD-10-CM | POA: Diagnosis not present

## 2016-01-07 DIAGNOSIS — Q348 Other specified congenital malformations of respiratory system: Secondary | ICD-10-CM | POA: Diagnosis not present

## 2016-01-07 DIAGNOSIS — J45909 Unspecified asthma, uncomplicated: Secondary | ICD-10-CM | POA: Diagnosis not present

## 2016-01-09 DIAGNOSIS — M6281 Muscle weakness (generalized): Secondary | ICD-10-CM | POA: Diagnosis not present

## 2016-01-09 DIAGNOSIS — I252 Old myocardial infarction: Secondary | ICD-10-CM | POA: Diagnosis not present

## 2016-01-09 DIAGNOSIS — W19XXXS Unspecified fall, sequela: Secondary | ICD-10-CM | POA: Diagnosis not present

## 2016-01-09 DIAGNOSIS — E1122 Type 2 diabetes mellitus with diabetic chronic kidney disease: Secondary | ICD-10-CM | POA: Diagnosis not present

## 2016-01-09 DIAGNOSIS — F329 Major depressive disorder, single episode, unspecified: Secondary | ICD-10-CM | POA: Diagnosis not present

## 2016-01-09 DIAGNOSIS — N183 Chronic kidney disease, stage 3 (moderate): Secondary | ICD-10-CM | POA: Diagnosis not present

## 2016-01-09 DIAGNOSIS — E1140 Type 2 diabetes mellitus with diabetic neuropathy, unspecified: Secondary | ICD-10-CM | POA: Diagnosis not present

## 2016-01-09 DIAGNOSIS — M545 Low back pain: Secondary | ICD-10-CM | POA: Diagnosis not present

## 2016-01-09 DIAGNOSIS — J449 Chronic obstructive pulmonary disease, unspecified: Secondary | ICD-10-CM | POA: Diagnosis not present

## 2016-01-11 DIAGNOSIS — J449 Chronic obstructive pulmonary disease, unspecified: Secondary | ICD-10-CM | POA: Diagnosis not present

## 2016-01-11 DIAGNOSIS — N183 Chronic kidney disease, stage 3 (moderate): Secondary | ICD-10-CM | POA: Diagnosis not present

## 2016-01-11 DIAGNOSIS — E1122 Type 2 diabetes mellitus with diabetic chronic kidney disease: Secondary | ICD-10-CM | POA: Diagnosis not present

## 2016-01-11 DIAGNOSIS — M6281 Muscle weakness (generalized): Secondary | ICD-10-CM | POA: Diagnosis not present

## 2016-01-11 DIAGNOSIS — E1140 Type 2 diabetes mellitus with diabetic neuropathy, unspecified: Secondary | ICD-10-CM | POA: Diagnosis not present

## 2016-01-11 DIAGNOSIS — F329 Major depressive disorder, single episode, unspecified: Secondary | ICD-10-CM | POA: Diagnosis not present

## 2016-01-11 DIAGNOSIS — I252 Old myocardial infarction: Secondary | ICD-10-CM | POA: Diagnosis not present

## 2016-01-11 DIAGNOSIS — W19XXXS Unspecified fall, sequela: Secondary | ICD-10-CM | POA: Diagnosis not present

## 2016-01-11 DIAGNOSIS — M545 Low back pain: Secondary | ICD-10-CM | POA: Diagnosis not present

## 2016-01-15 ENCOUNTER — Other Ambulatory Visit: Payer: Self-pay | Admitting: Family Medicine

## 2016-01-15 DIAGNOSIS — R296 Repeated falls: Secondary | ICD-10-CM | POA: Diagnosis not present

## 2016-01-15 DIAGNOSIS — E1122 Type 2 diabetes mellitus with diabetic chronic kidney disease: Secondary | ICD-10-CM | POA: Diagnosis not present

## 2016-01-15 DIAGNOSIS — M6281 Muscle weakness (generalized): Secondary | ICD-10-CM | POA: Diagnosis not present

## 2016-01-15 DIAGNOSIS — N183 Chronic kidney disease, stage 3 (moderate): Secondary | ICD-10-CM | POA: Diagnosis not present

## 2016-01-18 DIAGNOSIS — E1140 Type 2 diabetes mellitus with diabetic neuropathy, unspecified: Secondary | ICD-10-CM | POA: Diagnosis not present

## 2016-01-18 DIAGNOSIS — N183 Chronic kidney disease, stage 3 (moderate): Secondary | ICD-10-CM | POA: Diagnosis not present

## 2016-01-18 DIAGNOSIS — J449 Chronic obstructive pulmonary disease, unspecified: Secondary | ICD-10-CM | POA: Diagnosis not present

## 2016-01-18 DIAGNOSIS — W19XXXS Unspecified fall, sequela: Secondary | ICD-10-CM | POA: Diagnosis not present

## 2016-01-18 DIAGNOSIS — F329 Major depressive disorder, single episode, unspecified: Secondary | ICD-10-CM | POA: Diagnosis not present

## 2016-01-18 DIAGNOSIS — I252 Old myocardial infarction: Secondary | ICD-10-CM | POA: Diagnosis not present

## 2016-01-18 DIAGNOSIS — M545 Low back pain: Secondary | ICD-10-CM | POA: Diagnosis not present

## 2016-01-18 DIAGNOSIS — M6281 Muscle weakness (generalized): Secondary | ICD-10-CM | POA: Diagnosis not present

## 2016-01-18 DIAGNOSIS — E1122 Type 2 diabetes mellitus with diabetic chronic kidney disease: Secondary | ICD-10-CM | POA: Diagnosis not present

## 2016-01-20 DIAGNOSIS — E1140 Type 2 diabetes mellitus with diabetic neuropathy, unspecified: Secondary | ICD-10-CM | POA: Diagnosis not present

## 2016-01-20 DIAGNOSIS — M6281 Muscle weakness (generalized): Secondary | ICD-10-CM | POA: Diagnosis not present

## 2016-01-20 DIAGNOSIS — F329 Major depressive disorder, single episode, unspecified: Secondary | ICD-10-CM | POA: Diagnosis not present

## 2016-01-20 DIAGNOSIS — I252 Old myocardial infarction: Secondary | ICD-10-CM | POA: Diagnosis not present

## 2016-01-20 DIAGNOSIS — J449 Chronic obstructive pulmonary disease, unspecified: Secondary | ICD-10-CM | POA: Diagnosis not present

## 2016-01-20 DIAGNOSIS — E1122 Type 2 diabetes mellitus with diabetic chronic kidney disease: Secondary | ICD-10-CM | POA: Diagnosis not present

## 2016-01-20 DIAGNOSIS — N183 Chronic kidney disease, stage 3 (moderate): Secondary | ICD-10-CM | POA: Diagnosis not present

## 2016-01-20 DIAGNOSIS — M545 Low back pain: Secondary | ICD-10-CM | POA: Diagnosis not present

## 2016-01-20 DIAGNOSIS — W19XXXS Unspecified fall, sequela: Secondary | ICD-10-CM | POA: Diagnosis not present

## 2016-01-23 DIAGNOSIS — E1140 Type 2 diabetes mellitus with diabetic neuropathy, unspecified: Secondary | ICD-10-CM | POA: Diagnosis not present

## 2016-01-23 DIAGNOSIS — J449 Chronic obstructive pulmonary disease, unspecified: Secondary | ICD-10-CM | POA: Diagnosis not present

## 2016-01-23 DIAGNOSIS — M6281 Muscle weakness (generalized): Secondary | ICD-10-CM | POA: Diagnosis not present

## 2016-01-23 DIAGNOSIS — N183 Chronic kidney disease, stage 3 (moderate): Secondary | ICD-10-CM | POA: Diagnosis not present

## 2016-01-23 DIAGNOSIS — F329 Major depressive disorder, single episode, unspecified: Secondary | ICD-10-CM | POA: Diagnosis not present

## 2016-01-23 DIAGNOSIS — M545 Low back pain: Secondary | ICD-10-CM | POA: Diagnosis not present

## 2016-01-23 DIAGNOSIS — W19XXXS Unspecified fall, sequela: Secondary | ICD-10-CM | POA: Diagnosis not present

## 2016-01-23 DIAGNOSIS — E1122 Type 2 diabetes mellitus with diabetic chronic kidney disease: Secondary | ICD-10-CM | POA: Diagnosis not present

## 2016-01-23 DIAGNOSIS — I252 Old myocardial infarction: Secondary | ICD-10-CM | POA: Diagnosis not present

## 2016-01-25 DIAGNOSIS — N183 Chronic kidney disease, stage 3 (moderate): Secondary | ICD-10-CM | POA: Diagnosis not present

## 2016-01-25 DIAGNOSIS — M6281 Muscle weakness (generalized): Secondary | ICD-10-CM | POA: Diagnosis not present

## 2016-01-25 DIAGNOSIS — J449 Chronic obstructive pulmonary disease, unspecified: Secondary | ICD-10-CM | POA: Diagnosis not present

## 2016-01-25 DIAGNOSIS — F329 Major depressive disorder, single episode, unspecified: Secondary | ICD-10-CM | POA: Diagnosis not present

## 2016-01-25 DIAGNOSIS — M545 Low back pain: Secondary | ICD-10-CM | POA: Diagnosis not present

## 2016-01-25 DIAGNOSIS — E1122 Type 2 diabetes mellitus with diabetic chronic kidney disease: Secondary | ICD-10-CM | POA: Diagnosis not present

## 2016-01-25 DIAGNOSIS — W19XXXS Unspecified fall, sequela: Secondary | ICD-10-CM | POA: Diagnosis not present

## 2016-01-25 DIAGNOSIS — E1140 Type 2 diabetes mellitus with diabetic neuropathy, unspecified: Secondary | ICD-10-CM | POA: Diagnosis not present

## 2016-01-25 DIAGNOSIS — I252 Old myocardial infarction: Secondary | ICD-10-CM | POA: Diagnosis not present

## 2016-01-29 ENCOUNTER — Telehealth: Payer: Self-pay | Admitting: *Deleted

## 2016-01-29 DIAGNOSIS — E1140 Type 2 diabetes mellitus with diabetic neuropathy, unspecified: Secondary | ICD-10-CM | POA: Diagnosis not present

## 2016-01-29 DIAGNOSIS — J449 Chronic obstructive pulmonary disease, unspecified: Secondary | ICD-10-CM | POA: Diagnosis not present

## 2016-01-29 DIAGNOSIS — M6281 Muscle weakness (generalized): Secondary | ICD-10-CM | POA: Diagnosis not present

## 2016-01-29 DIAGNOSIS — N183 Chronic kidney disease, stage 3 (moderate): Secondary | ICD-10-CM | POA: Diagnosis not present

## 2016-01-29 DIAGNOSIS — E119 Type 2 diabetes mellitus without complications: Secondary | ICD-10-CM | POA: Diagnosis not present

## 2016-01-29 DIAGNOSIS — N3941 Urge incontinence: Secondary | ICD-10-CM | POA: Diagnosis not present

## 2016-01-29 DIAGNOSIS — E1122 Type 2 diabetes mellitus with diabetic chronic kidney disease: Secondary | ICD-10-CM | POA: Diagnosis not present

## 2016-01-29 DIAGNOSIS — F329 Major depressive disorder, single episode, unspecified: Secondary | ICD-10-CM | POA: Diagnosis not present

## 2016-01-29 DIAGNOSIS — W19XXXS Unspecified fall, sequela: Secondary | ICD-10-CM | POA: Diagnosis not present

## 2016-01-29 DIAGNOSIS — I252 Old myocardial infarction: Secondary | ICD-10-CM | POA: Diagnosis not present

## 2016-01-29 DIAGNOSIS — M545 Low back pain: Secondary | ICD-10-CM | POA: Diagnosis not present

## 2016-01-29 NOTE — Telephone Encounter (Signed)
Continuation of PT. OV given,.Theresa Huang

## 2016-01-31 DIAGNOSIS — Z9889 Other specified postprocedural states: Secondary | ICD-10-CM | POA: Diagnosis not present

## 2016-01-31 DIAGNOSIS — M65322 Trigger finger, left index finger: Secondary | ICD-10-CM | POA: Diagnosis not present

## 2016-02-06 DIAGNOSIS — J45909 Unspecified asthma, uncomplicated: Secondary | ICD-10-CM | POA: Diagnosis not present

## 2016-02-06 DIAGNOSIS — M2042 Other hammer toe(s) (acquired), left foot: Secondary | ICD-10-CM | POA: Diagnosis not present

## 2016-02-06 DIAGNOSIS — E119 Type 2 diabetes mellitus without complications: Secondary | ICD-10-CM | POA: Diagnosis not present

## 2016-02-06 DIAGNOSIS — M2041 Other hammer toe(s) (acquired), right foot: Secondary | ICD-10-CM | POA: Diagnosis not present

## 2016-02-06 DIAGNOSIS — Q348 Other specified congenital malformations of respiratory system: Secondary | ICD-10-CM | POA: Diagnosis not present

## 2016-02-06 DIAGNOSIS — M24572 Contracture, left ankle: Secondary | ICD-10-CM | POA: Diagnosis not present

## 2016-02-06 DIAGNOSIS — M24571 Contracture, right ankle: Secondary | ICD-10-CM | POA: Diagnosis not present

## 2016-02-12 DIAGNOSIS — I252 Old myocardial infarction: Secondary | ICD-10-CM | POA: Diagnosis not present

## 2016-02-12 DIAGNOSIS — W19XXXS Unspecified fall, sequela: Secondary | ICD-10-CM | POA: Diagnosis not present

## 2016-02-12 DIAGNOSIS — M545 Low back pain: Secondary | ICD-10-CM | POA: Diagnosis not present

## 2016-02-12 DIAGNOSIS — F329 Major depressive disorder, single episode, unspecified: Secondary | ICD-10-CM | POA: Diagnosis not present

## 2016-02-12 DIAGNOSIS — E1122 Type 2 diabetes mellitus with diabetic chronic kidney disease: Secondary | ICD-10-CM | POA: Diagnosis not present

## 2016-02-12 DIAGNOSIS — N183 Chronic kidney disease, stage 3 (moderate): Secondary | ICD-10-CM | POA: Diagnosis not present

## 2016-02-12 DIAGNOSIS — E1140 Type 2 diabetes mellitus with diabetic neuropathy, unspecified: Secondary | ICD-10-CM | POA: Diagnosis not present

## 2016-02-12 DIAGNOSIS — M6281 Muscle weakness (generalized): Secondary | ICD-10-CM | POA: Diagnosis not present

## 2016-02-12 DIAGNOSIS — J449 Chronic obstructive pulmonary disease, unspecified: Secondary | ICD-10-CM | POA: Diagnosis not present

## 2016-02-13 ENCOUNTER — Other Ambulatory Visit: Payer: Self-pay | Admitting: Family Medicine

## 2016-02-13 DIAGNOSIS — E1140 Type 2 diabetes mellitus with diabetic neuropathy, unspecified: Secondary | ICD-10-CM | POA: Diagnosis not present

## 2016-02-13 DIAGNOSIS — J449 Chronic obstructive pulmonary disease, unspecified: Secondary | ICD-10-CM | POA: Diagnosis not present

## 2016-02-13 DIAGNOSIS — W19XXXS Unspecified fall, sequela: Secondary | ICD-10-CM | POA: Diagnosis not present

## 2016-02-13 DIAGNOSIS — F329 Major depressive disorder, single episode, unspecified: Secondary | ICD-10-CM | POA: Diagnosis not present

## 2016-02-13 DIAGNOSIS — I252 Old myocardial infarction: Secondary | ICD-10-CM | POA: Diagnosis not present

## 2016-02-13 DIAGNOSIS — E1122 Type 2 diabetes mellitus with diabetic chronic kidney disease: Secondary | ICD-10-CM | POA: Diagnosis not present

## 2016-02-13 DIAGNOSIS — M545 Low back pain: Secondary | ICD-10-CM | POA: Diagnosis not present

## 2016-02-13 DIAGNOSIS — N183 Chronic kidney disease, stage 3 (moderate): Secondary | ICD-10-CM | POA: Diagnosis not present

## 2016-02-13 DIAGNOSIS — M6281 Muscle weakness (generalized): Secondary | ICD-10-CM | POA: Diagnosis not present

## 2016-02-15 ENCOUNTER — Other Ambulatory Visit: Payer: Self-pay | Admitting: Family Medicine

## 2016-02-19 DIAGNOSIS — F329 Major depressive disorder, single episode, unspecified: Secondary | ICD-10-CM | POA: Diagnosis not present

## 2016-02-19 DIAGNOSIS — J449 Chronic obstructive pulmonary disease, unspecified: Secondary | ICD-10-CM | POA: Diagnosis not present

## 2016-02-19 DIAGNOSIS — M545 Low back pain: Secondary | ICD-10-CM | POA: Diagnosis not present

## 2016-02-19 DIAGNOSIS — I252 Old myocardial infarction: Secondary | ICD-10-CM | POA: Diagnosis not present

## 2016-02-19 DIAGNOSIS — E1140 Type 2 diabetes mellitus with diabetic neuropathy, unspecified: Secondary | ICD-10-CM | POA: Diagnosis not present

## 2016-02-19 DIAGNOSIS — W19XXXS Unspecified fall, sequela: Secondary | ICD-10-CM | POA: Diagnosis not present

## 2016-02-19 DIAGNOSIS — N183 Chronic kidney disease, stage 3 (moderate): Secondary | ICD-10-CM | POA: Diagnosis not present

## 2016-02-19 DIAGNOSIS — M6281 Muscle weakness (generalized): Secondary | ICD-10-CM | POA: Diagnosis not present

## 2016-02-19 DIAGNOSIS — E1122 Type 2 diabetes mellitus with diabetic chronic kidney disease: Secondary | ICD-10-CM | POA: Diagnosis not present

## 2016-02-22 DIAGNOSIS — M545 Low back pain: Secondary | ICD-10-CM | POA: Diagnosis not present

## 2016-02-22 DIAGNOSIS — E1122 Type 2 diabetes mellitus with diabetic chronic kidney disease: Secondary | ICD-10-CM | POA: Diagnosis not present

## 2016-02-22 DIAGNOSIS — N183 Chronic kidney disease, stage 3 (moderate): Secondary | ICD-10-CM | POA: Diagnosis not present

## 2016-02-22 DIAGNOSIS — M6281 Muscle weakness (generalized): Secondary | ICD-10-CM | POA: Diagnosis not present

## 2016-02-22 DIAGNOSIS — F329 Major depressive disorder, single episode, unspecified: Secondary | ICD-10-CM | POA: Diagnosis not present

## 2016-02-22 DIAGNOSIS — E1140 Type 2 diabetes mellitus with diabetic neuropathy, unspecified: Secondary | ICD-10-CM | POA: Diagnosis not present

## 2016-02-22 DIAGNOSIS — J449 Chronic obstructive pulmonary disease, unspecified: Secondary | ICD-10-CM | POA: Diagnosis not present

## 2016-02-22 DIAGNOSIS — W19XXXS Unspecified fall, sequela: Secondary | ICD-10-CM | POA: Diagnosis not present

## 2016-02-22 DIAGNOSIS — I252 Old myocardial infarction: Secondary | ICD-10-CM | POA: Diagnosis not present

## 2016-02-29 DIAGNOSIS — E119 Type 2 diabetes mellitus without complications: Secondary | ICD-10-CM | POA: Diagnosis not present

## 2016-02-29 DIAGNOSIS — N3941 Urge incontinence: Secondary | ICD-10-CM | POA: Diagnosis not present

## 2016-02-29 DIAGNOSIS — J449 Chronic obstructive pulmonary disease, unspecified: Secondary | ICD-10-CM | POA: Diagnosis not present

## 2016-03-08 DIAGNOSIS — J45909 Unspecified asthma, uncomplicated: Secondary | ICD-10-CM | POA: Diagnosis not present

## 2016-03-08 DIAGNOSIS — Q348 Other specified congenital malformations of respiratory system: Secondary | ICD-10-CM | POA: Diagnosis not present

## 2016-03-12 ENCOUNTER — Other Ambulatory Visit: Payer: Self-pay | Admitting: Family Medicine

## 2016-03-14 ENCOUNTER — Other Ambulatory Visit: Payer: Self-pay | Admitting: Family Medicine

## 2016-03-25 ENCOUNTER — Encounter: Payer: Self-pay | Admitting: Family Medicine

## 2016-03-25 ENCOUNTER — Ambulatory Visit (INDEPENDENT_AMBULATORY_CARE_PROVIDER_SITE_OTHER): Payer: Commercial Managed Care - HMO | Admitting: Family Medicine

## 2016-03-25 VITALS — BP 115/61 | HR 88 | Wt 148.0 lb

## 2016-03-25 DIAGNOSIS — R296 Repeated falls: Secondary | ICD-10-CM

## 2016-03-25 DIAGNOSIS — Z794 Long term (current) use of insulin: Secondary | ICD-10-CM | POA: Diagnosis not present

## 2016-03-25 DIAGNOSIS — M545 Low back pain, unspecified: Secondary | ICD-10-CM

## 2016-03-25 DIAGNOSIS — G8929 Other chronic pain: Secondary | ICD-10-CM

## 2016-03-25 DIAGNOSIS — E118 Type 2 diabetes mellitus with unspecified complications: Secondary | ICD-10-CM

## 2016-03-25 LAB — POCT GLYCOSYLATED HEMOGLOBIN (HGB A1C): HEMOGLOBIN A1C: 6.5

## 2016-03-25 MED ORDER — TRAMADOL HCL 50 MG PO TABS: ORAL_TABLET | ORAL | Status: DC

## 2016-03-25 NOTE — Progress Notes (Signed)
Subjective:    CC: DM  HPI: Diabetes - no hypoglycemic events. No wounds or sores that are not healing well. No increased thirst or urination. Checking glucose at home. Taking medications as prescribed without any side effects.  Dizziness/frequent falls-physical therapy did come out and she says she's been doing her exercises every morning when she first gets out of bed. Her daughter reports that she is actually doing much better overall.  Chronic low back pain-she does need her tramadol refill today. Otherwise she is doing okay.  Past medical history, Surgical history, Family history not pertinant except as noted below, Social history, Allergies, and medications have been entered into the medical record, reviewed, and corrections made.   Review of Systems: No fevers, chills, night sweats, weight loss, chest pain, or shortness of breath.   Objective:    General: Well Developed, well nourished, and in no acute distress.  Neuro: Alert and oriented x3, extra-ocular muscles intact, sensation grossly intact.  HEENT: Normocephalic, atraumatic  Skin: Warm and dry, no rashes. Cardiac: Regular rate and rhythm, no murmurs rubs or gallops, no lower extremity edema.  Respiratory: Clear to auscultation bilaterally. Not using accessory muscles, speaking in full sentences.   Impression and Recommendations:   DM-  Hemoglobin A1c is elevated compared to previous. The stoma normal range at 6.5. F/U in 3 months.   Chronic low back pain-tramadol refilled.   Dizziness/frequent falls-overall improved after physical therapy. Encouraged her to continue doing her home exercises.

## 2016-04-01 DIAGNOSIS — E119 Type 2 diabetes mellitus without complications: Secondary | ICD-10-CM | POA: Diagnosis not present

## 2016-04-01 DIAGNOSIS — N3941 Urge incontinence: Secondary | ICD-10-CM | POA: Diagnosis not present

## 2016-04-01 DIAGNOSIS — J449 Chronic obstructive pulmonary disease, unspecified: Secondary | ICD-10-CM | POA: Diagnosis not present

## 2016-04-07 DIAGNOSIS — Q348 Other specified congenital malformations of respiratory system: Secondary | ICD-10-CM | POA: Diagnosis not present

## 2016-04-07 DIAGNOSIS — J45909 Unspecified asthma, uncomplicated: Secondary | ICD-10-CM | POA: Diagnosis not present

## 2016-04-13 ENCOUNTER — Other Ambulatory Visit: Payer: Self-pay | Admitting: Family Medicine

## 2016-05-02 DIAGNOSIS — E119 Type 2 diabetes mellitus without complications: Secondary | ICD-10-CM | POA: Diagnosis not present

## 2016-05-02 DIAGNOSIS — N3941 Urge incontinence: Secondary | ICD-10-CM | POA: Diagnosis not present

## 2016-05-02 DIAGNOSIS — J449 Chronic obstructive pulmonary disease, unspecified: Secondary | ICD-10-CM | POA: Diagnosis not present

## 2016-05-08 ENCOUNTER — Ambulatory Visit (HOSPITAL_COMMUNITY): Admission: RE | Admit: 2016-05-08 | Payer: Commercial Managed Care - HMO | Source: Ambulatory Visit

## 2016-05-08 ENCOUNTER — Ambulatory Visit: Payer: Commercial Managed Care - HMO

## 2016-05-08 ENCOUNTER — Ambulatory Visit (INDEPENDENT_AMBULATORY_CARE_PROVIDER_SITE_OTHER): Payer: Commercial Managed Care - HMO

## 2016-05-08 ENCOUNTER — Other Ambulatory Visit: Payer: Self-pay | Admitting: Family Medicine

## 2016-05-08 ENCOUNTER — Ambulatory Visit (INDEPENDENT_AMBULATORY_CARE_PROVIDER_SITE_OTHER): Payer: Commercial Managed Care - HMO | Admitting: Family Medicine

## 2016-05-08 ENCOUNTER — Encounter: Payer: Self-pay | Admitting: Family Medicine

## 2016-05-08 VITALS — BP 123/68 | HR 76 | Temp 98.0°F | Resp 18 | Ht 64.0 in | Wt 147.0 lb

## 2016-05-08 DIAGNOSIS — M791 Myalgia: Secondary | ICD-10-CM

## 2016-05-08 DIAGNOSIS — S3993XA Unspecified injury of pelvis, initial encounter: Secondary | ICD-10-CM | POA: Diagnosis not present

## 2016-05-08 DIAGNOSIS — M25512 Pain in left shoulder: Secondary | ICD-10-CM | POA: Diagnosis not present

## 2016-05-08 DIAGNOSIS — M545 Low back pain: Secondary | ICD-10-CM | POA: Diagnosis not present

## 2016-05-08 DIAGNOSIS — M7918 Myalgia, other site: Secondary | ICD-10-CM | POA: Insufficient documentation

## 2016-05-08 DIAGNOSIS — M5136 Other intervertebral disc degeneration, lumbar region: Secondary | ICD-10-CM

## 2016-05-08 DIAGNOSIS — M25551 Pain in right hip: Secondary | ICD-10-CM

## 2016-05-08 DIAGNOSIS — M25559 Pain in unspecified hip: Secondary | ICD-10-CM | POA: Diagnosis not present

## 2016-05-08 DIAGNOSIS — S3992XA Unspecified injury of lower back, initial encounter: Secondary | ICD-10-CM | POA: Diagnosis not present

## 2016-05-08 DIAGNOSIS — G8929 Other chronic pain: Secondary | ICD-10-CM | POA: Diagnosis not present

## 2016-05-08 DIAGNOSIS — J45909 Unspecified asthma, uncomplicated: Secondary | ICD-10-CM | POA: Diagnosis not present

## 2016-05-08 DIAGNOSIS — R296 Repeated falls: Secondary | ICD-10-CM | POA: Diagnosis not present

## 2016-05-08 DIAGNOSIS — I700 Atherosclerosis of aorta: Secondary | ICD-10-CM

## 2016-05-08 DIAGNOSIS — Q348 Other specified congenital malformations of respiratory system: Secondary | ICD-10-CM | POA: Diagnosis not present

## 2016-05-08 DIAGNOSIS — M19012 Primary osteoarthritis, left shoulder: Secondary | ICD-10-CM | POA: Insufficient documentation

## 2016-05-08 DIAGNOSIS — M4806 Spinal stenosis, lumbar region: Secondary | ICD-10-CM

## 2016-05-08 DIAGNOSIS — S4992XA Unspecified injury of left shoulder and upper arm, initial encounter: Secondary | ICD-10-CM | POA: Diagnosis not present

## 2016-05-08 NOTE — Patient Instructions (Addendum)
Thank you for coming in today. I think you may have broken your pelvis.  We will do a CT scan.  We will contact you with results.  Use a walker and keep the weight off the leg for the most part.

## 2016-05-08 NOTE — Progress Notes (Signed)
Theresa Huang is a 82 y.o. female who presents to [facility] today for buttocks pain. Patient has had frequent falls in the last several months. She fell 3 days ago and landed on her right buttocks. She has continued pain in her buttocks and right groin radiating to the posterior thigh to the knee area pain is worse with ambulation but does still exist with rest. She denies any pain radiating below the level of her knee. She denies any weakness or numbness.  She notes ongoing left shoulder pain. This is been present for several months and occurred without specific injury. That she has fallen several times also however. She has pain is worse with motion and better with rest. Pain is worse with overhead motion. She has pain and difficulty reaching behind her back as well. She denies any radiating pain weakness or numbness.  She uses a walker to ambulate at times and takes over-the-counter medications for pain which help as well.   Past Medical History:  Diagnosis Date  . Anemia   . Cancer ([place])    SCC  . Carotid artery occlusion   . COPD (chronic obstructive pulmonary disease) (HCC)    wears oxygen at 3L prn daytime and 3L at night   . DDD (degenerative disc disease)   . Diabetes mellitus (HCC)    fasting blood sugar 80-100  . Diastolic dysfunction   . Dizziness   . Glaucoma   . Headache(784.0)    migranes  . Hearing loss of both ears    wears bilateral hearing aids  . Hemorrhoids   . Holter monitor, abnormal    wearing for 21 days  . Hyperlipidemia   . Hypothyroid   . Inflammatory polyps of colon with rectal bleeding ([place])   . Irregular heart beat   . Myocardial infarction ([place]) 2006  . Pneumonia    hx  . Retinopathy of left eye   . Shortness of breath    most of time  . Syncope    Past Surgical History:  Procedure Laterality Date  . ABDOMINAL  HYSTERECTOMY    . CAROTID ENDARTERECTOMY    . COLONOSCOPY W/ POLYPECTOMY    . ENDARTERECTOMY  09/01/2012   Procedure: ENDARTERECTOMY CAROTID;  Surgeon: Conrad Audubon, MD;  Location: [facility];  Service: Vascular;  Laterality: Right;  . EYE SURGERY     cataracts  . HEMORRHOID SURGERY    . PATCH ANGIOPLASTY  09/01/2012   Procedure: PATCH ANGIOPLASTY;  Surgeon: Conrad Milford, MD;  Location: [facility];  Service: Vascular;  Laterality: Right;  Vascu-Guard Patch Angioplasty  . squamous cell removed    . THROAT SURGERY    . TONSILLECTOMY     Social History  Substance Use Topics  . Smoking status: Former Smoker    Types: Cigarettes    Quit date: 09/17/1987  . Smokeless tobacco: Never Used  . Alcohol use No   family history includes Breast cancer in her daughter; CAD in her father and mother; Cancer in her daughter; Depression in her mother; Diabetes in her brother and mother; Heart disease in her father and mother; Hyperlipidemia in her daughter; Hypertension in her son.  ROS as above:  Medications: Current Outpatient Prescriptions  Medication Sig Dispense Refill  . acetaminophen (TYLENOL ARTHRITIS PAIN) 650 MG CR tablet Take 650 mg by mouth every 8 (eight) hours as needed. For pain    . ADVAIR DISKUS 250-50 MCG/DOSE  AEPB INHALE 1 PUFF TWICE DAILY 60 each 0  . alendronate (FOSAMAX) 70 MG tablet TAKE 1 TABLET ONCE A WEEK 4 tablet 5  . AMBULATORY NON FORMULARY MEDICATION Medication Name: Home continuous oxygen.  3 liters. Please provide portable prn. Dx: COPD 1 Device prn  . AMBULATORY NON FORMULARY MEDICATION Medication Name: glucometer strips to test BID.  Dx. 250.00 100 Units PRN  . aspirin 81 MG tablet Take 81 mg by mouth daily.      . Calcium-Vitamin D (CALTRATE 600 PLUS-VIT D PO) Take 1 tablet by mouth daily.     [name] FLUoxetine (PROZAC) 10 MG capsule TAKE 2 CAPSULES BY MOUTH ONCE DAILY 60 capsule 0  . GLIPIZIDE XL 5 MG 24 hr tablet TAKE 1 TABLET BY MOUTH ONCE DAILY 30 tablet 0  . glucose blood  (TRUETRACK TEST) test strip TESTING TWICE DAILY. DX: E11.9 100 each PRN  . LANTUS SOLOSTAR 100 UNIT/ML Solostar Pen INJECT 8 UNITS INTO THE SKIN AT BEDTIME 3 mL 0  . loratadine (CLARITIN) 10 MG tablet Take 1 tablet (10 mg total) by mouth daily. 30 tablet 11  . Multiple Vitamin (MULTIVITAMIN) tablet Take 1 tablet by mouth daily.      . naproxen (NAPROSYN) 500 MG tablet TAKE ONE TABLET BY MOUTH 2 TIMES A DAY WITH A MEAL AS NEEDED FOR JAW AND EAR PAIN 60 tablet 0  . nitroGLYCERIN (NITROSTAT) 0.4 MG SL tablet Place 1 tablet (0.4 mg total) under the tongue every 5 (five) minutes as needed. For chest pain 30 tablet 0  . QC LANCETS SUPER THIN MISC use for testing TWICE DAILY. DX: E11.9 100 each 11  . sertraline (ZOLOFT) 100 MG tablet TAKE 1 & 1/2 TABLETS BY MOUTH EVERY DAY 45 tablet 0  . simvastatin (ZOCOR) 20 MG tablet Take 1 tablet (20 mg total) by mouth at bedtime. 90 tablet 3  . SPIRIVA HANDIHALER 18 MCG inhalation capsule INHALE 1 CAPSULE BY MOUTH EVERY DAY 30 capsule 0  . SYNTHROID 75 MCG tablet TAKE ONE TABLET BY MOUTH EVERY DAY BEFORE BREAKFAST 30 tablet 0  . traMADol (ULTRAM) 50 MG tablet TAKE 1 TABLET BY MOUTH DAILY AS NEEDED 30 tablet 2  . UNIFINE PENTIPS 29G X 12MM MISC USE ONCE DAILY AS DIRECTED 100 each 0  . VENTOLIN HFA 108 (90 Base) MCG/ACT inhaler INHALE 2 PUFFS INTO THE LUNGS EVERY 4 HOURS AS NEEDED FOR WHEEZING 18 g 0   No current facility-administered medications for this visit.    Allergies  Allergen Reactions  . Tetracyclines & Related Anaphylaxis  . Tetracycline     REACTION: arms swelling, itching, blisters     Exam:  BP 123/68 (BP Location: Right Arm, Patient Position: Sitting, Cuff Size: Normal)   Pulse 76   Temp 98 F (36.7 C) (Oral)   Resp 18   Ht 5\' 4"  (1.626 m)   Wt 147 lb (66.7 kg)   SpO2 94%   BMI 25.23 kg/m  Gen: Well NAD Left shoulder: Normal-appearing nontender. Range of motion Abduction: 120 limited active and passive External rotation 20 beyond  neutral position active and passive Internal rotation to the lumbar spine Strength 4+/5 in all planes Negative Hawkins and Neer's test. Positive empty can test. Negative Yergason's and speeds test.  Back: Nontender to spinal midline nontender throughout lower back. Decreased back motion due to pain. Hips laterally are nontender in the lateral aspect. Normal motion with mild pain in the groin bilaterally. Tender to palpation in the anterior groin  and into the buttocks or the initial tuberosity in the right side Patient does not have any pain with resisted knee flexion or extension.  X-ray L-spine shows diffuse DDD without acute fracture visible  X-ray pelvis shows lucency at the right inferior ramus near the pubic symphysis  X-ray left shoulder shows degenerative joint disease but no acute injury  Awaiting formal radiology review  No results found for this or any previous visit (from the past 24 hour(s)). No results found.    Assessment and Plan: 82 y.o. female with  Right buttocks pain in the setting of frequent falls. X-ray and exam is concerning for an acute pubic rami fracture.  Plan for Noncontrast CT scan of the pelvis to further elucidate injury. Meantime use walker for ambulation  Left shoulder pain: Likely degenerative joint disease with possible rotator cuff injury. Plan likely for home physical therapy however plan may change based on CT scan results of pelvis.   Orders Placed This Encounter  Procedures  . DG Lumbar Spine Complete    Standing Status:   Future    Number of Occurrences:   1    Standing Expiration Date:   07/08/2017    Order Specific Question:   Reason for Exam (SYMPTOM  OR DIAGNOSIS REQUIRED)    Answer:   fall right buttock pain concern radiculpathy    Order Specific Question:   Preferred imaging location?    Answer:   [facility]  . DG Shoulder Left    Standing Status:   Future    Number of Occurrences:   1    Standing Expiration  Date:   07/08/2017    Order Specific Question:   Reason for Exam (SYMPTOM  OR DIAGNOSIS REQUIRED)    Answer:   4 months left shoulder pain concern djd    Order Specific Question:   Preferred imaging location?    Answer:   [facility]  . DG Pelvis 1-2 Views    Standing Status:   Future    Number of Occurrences:   1    Standing Expiration Date:   07/08/2017    Order Specific Question:   Reason for Exam (SYMPTOM  OR DIAGNOSIS REQUIRED)    Answer:   fall right buttock pain concern fx    Order Specific Question:   Preferred imaging location?    Answer:   [facility]  . CT PELVIS WO CONTRAST    Standing Status:   Future    Standing Expiration Date:   08/08/2017    Order Specific Question:   Reason for Exam (SYMPTOM  OR DIAGNOSIS REQUIRED)    Answer:   eval possible rt pubic rami fracture    Order Specific Question:   Preferred imaging location?    Answer:   [facility]    Discussed warning signs or symptoms. Please see discharge instructions. Patient expresses understanding.  CC: METHENEY,CATHERINE, MD

## 2016-05-08 NOTE — Addendum Note (Signed)
Addended by: Gregor Hams on: 05/08/2016 01:15 PM   Modules accepted: Orders

## 2016-05-09 DIAGNOSIS — M25512 Pain in left shoulder: Secondary | ICD-10-CM | POA: Diagnosis not present

## 2016-05-09 DIAGNOSIS — M810 Age-related osteoporosis without current pathological fracture: Secondary | ICD-10-CM | POA: Diagnosis not present

## 2016-05-09 DIAGNOSIS — E1122 Type 2 diabetes mellitus with diabetic chronic kidney disease: Secondary | ICD-10-CM | POA: Diagnosis not present

## 2016-05-09 DIAGNOSIS — M545 Low back pain: Secondary | ICD-10-CM | POA: Diagnosis not present

## 2016-05-09 DIAGNOSIS — J449 Chronic obstructive pulmonary disease, unspecified: Secondary | ICD-10-CM | POA: Diagnosis not present

## 2016-05-09 DIAGNOSIS — N183 Chronic kidney disease, stage 3 (moderate): Secondary | ICD-10-CM | POA: Diagnosis not present

## 2016-05-09 DIAGNOSIS — I6523 Occlusion and stenosis of bilateral carotid arteries: Secondary | ICD-10-CM | POA: Diagnosis not present

## 2016-05-09 DIAGNOSIS — R296 Repeated falls: Secondary | ICD-10-CM | POA: Diagnosis not present

## 2016-05-09 DIAGNOSIS — M791 Myalgia: Secondary | ICD-10-CM | POA: Diagnosis not present

## 2016-05-13 DIAGNOSIS — J449 Chronic obstructive pulmonary disease, unspecified: Secondary | ICD-10-CM | POA: Diagnosis not present

## 2016-05-13 DIAGNOSIS — I6523 Occlusion and stenosis of bilateral carotid arteries: Secondary | ICD-10-CM | POA: Diagnosis not present

## 2016-05-13 DIAGNOSIS — N183 Chronic kidney disease, stage 3 (moderate): Secondary | ICD-10-CM | POA: Diagnosis not present

## 2016-05-13 DIAGNOSIS — M810 Age-related osteoporosis without current pathological fracture: Secondary | ICD-10-CM | POA: Diagnosis not present

## 2016-05-13 DIAGNOSIS — M791 Myalgia: Secondary | ICD-10-CM | POA: Diagnosis not present

## 2016-05-13 DIAGNOSIS — R296 Repeated falls: Secondary | ICD-10-CM | POA: Diagnosis not present

## 2016-05-13 DIAGNOSIS — E1122 Type 2 diabetes mellitus with diabetic chronic kidney disease: Secondary | ICD-10-CM | POA: Diagnosis not present

## 2016-05-13 DIAGNOSIS — M545 Low back pain: Secondary | ICD-10-CM | POA: Diagnosis not present

## 2016-05-13 DIAGNOSIS — M25512 Pain in left shoulder: Secondary | ICD-10-CM | POA: Diagnosis not present

## 2016-05-14 ENCOUNTER — Other Ambulatory Visit: Payer: Self-pay | Admitting: Family Medicine

## 2016-05-14 DIAGNOSIS — J449 Chronic obstructive pulmonary disease, unspecified: Secondary | ICD-10-CM

## 2016-05-14 DIAGNOSIS — N183 Chronic kidney disease, stage 3 (moderate): Secondary | ICD-10-CM

## 2016-05-14 DIAGNOSIS — M25512 Pain in left shoulder: Secondary | ICD-10-CM | POA: Diagnosis not present

## 2016-05-14 DIAGNOSIS — E1122 Type 2 diabetes mellitus with diabetic chronic kidney disease: Secondary | ICD-10-CM | POA: Diagnosis not present

## 2016-05-14 DIAGNOSIS — R296 Repeated falls: Secondary | ICD-10-CM | POA: Diagnosis not present

## 2016-05-14 DIAGNOSIS — M791 Myalgia: Secondary | ICD-10-CM | POA: Diagnosis not present

## 2016-05-15 DIAGNOSIS — I6523 Occlusion and stenosis of bilateral carotid arteries: Secondary | ICD-10-CM | POA: Diagnosis not present

## 2016-05-15 DIAGNOSIS — M810 Age-related osteoporosis without current pathological fracture: Secondary | ICD-10-CM | POA: Diagnosis not present

## 2016-05-15 DIAGNOSIS — R296 Repeated falls: Secondary | ICD-10-CM | POA: Diagnosis not present

## 2016-05-15 DIAGNOSIS — J449 Chronic obstructive pulmonary disease, unspecified: Secondary | ICD-10-CM | POA: Diagnosis not present

## 2016-05-15 DIAGNOSIS — M545 Low back pain: Secondary | ICD-10-CM | POA: Diagnosis not present

## 2016-05-15 DIAGNOSIS — M25512 Pain in left shoulder: Secondary | ICD-10-CM | POA: Diagnosis not present

## 2016-05-15 DIAGNOSIS — N183 Chronic kidney disease, stage 3 (moderate): Secondary | ICD-10-CM | POA: Diagnosis not present

## 2016-05-15 DIAGNOSIS — M791 Myalgia: Secondary | ICD-10-CM | POA: Diagnosis not present

## 2016-05-15 DIAGNOSIS — E1122 Type 2 diabetes mellitus with diabetic chronic kidney disease: Secondary | ICD-10-CM | POA: Diagnosis not present

## 2016-05-17 ENCOUNTER — Other Ambulatory Visit: Payer: Self-pay | Admitting: Family Medicine

## 2016-05-20 DIAGNOSIS — R296 Repeated falls: Secondary | ICD-10-CM | POA: Diagnosis not present

## 2016-05-20 DIAGNOSIS — E1122 Type 2 diabetes mellitus with diabetic chronic kidney disease: Secondary | ICD-10-CM | POA: Diagnosis not present

## 2016-05-20 DIAGNOSIS — M791 Myalgia: Secondary | ICD-10-CM | POA: Diagnosis not present

## 2016-05-20 DIAGNOSIS — M25512 Pain in left shoulder: Secondary | ICD-10-CM | POA: Diagnosis not present

## 2016-05-20 DIAGNOSIS — N183 Chronic kidney disease, stage 3 (moderate): Secondary | ICD-10-CM | POA: Diagnosis not present

## 2016-05-20 DIAGNOSIS — I6523 Occlusion and stenosis of bilateral carotid arteries: Secondary | ICD-10-CM | POA: Diagnosis not present

## 2016-05-20 DIAGNOSIS — M545 Low back pain: Secondary | ICD-10-CM | POA: Diagnosis not present

## 2016-05-20 DIAGNOSIS — M810 Age-related osteoporosis without current pathological fracture: Secondary | ICD-10-CM | POA: Diagnosis not present

## 2016-05-20 DIAGNOSIS — J449 Chronic obstructive pulmonary disease, unspecified: Secondary | ICD-10-CM | POA: Diagnosis not present

## 2016-05-23 DIAGNOSIS — J449 Chronic obstructive pulmonary disease, unspecified: Secondary | ICD-10-CM | POA: Diagnosis not present

## 2016-05-23 DIAGNOSIS — M810 Age-related osteoporosis without current pathological fracture: Secondary | ICD-10-CM | POA: Diagnosis not present

## 2016-05-23 DIAGNOSIS — M25512 Pain in left shoulder: Secondary | ICD-10-CM | POA: Diagnosis not present

## 2016-05-23 DIAGNOSIS — N183 Chronic kidney disease, stage 3 (moderate): Secondary | ICD-10-CM | POA: Diagnosis not present

## 2016-05-23 DIAGNOSIS — M545 Low back pain: Secondary | ICD-10-CM | POA: Diagnosis not present

## 2016-05-23 DIAGNOSIS — M791 Myalgia: Secondary | ICD-10-CM | POA: Diagnosis not present

## 2016-05-23 DIAGNOSIS — I6523 Occlusion and stenosis of bilateral carotid arteries: Secondary | ICD-10-CM | POA: Diagnosis not present

## 2016-05-23 DIAGNOSIS — R296 Repeated falls: Secondary | ICD-10-CM | POA: Diagnosis not present

## 2016-05-23 DIAGNOSIS — E1122 Type 2 diabetes mellitus with diabetic chronic kidney disease: Secondary | ICD-10-CM | POA: Diagnosis not present

## 2016-05-31 DIAGNOSIS — E1122 Type 2 diabetes mellitus with diabetic chronic kidney disease: Secondary | ICD-10-CM | POA: Diagnosis not present

## 2016-05-31 DIAGNOSIS — M25512 Pain in left shoulder: Secondary | ICD-10-CM | POA: Diagnosis not present

## 2016-05-31 DIAGNOSIS — M791 Myalgia: Secondary | ICD-10-CM | POA: Diagnosis not present

## 2016-05-31 DIAGNOSIS — J449 Chronic obstructive pulmonary disease, unspecified: Secondary | ICD-10-CM | POA: Diagnosis not present

## 2016-05-31 DIAGNOSIS — N183 Chronic kidney disease, stage 3 (moderate): Secondary | ICD-10-CM | POA: Diagnosis not present

## 2016-05-31 DIAGNOSIS — M545 Low back pain: Secondary | ICD-10-CM | POA: Diagnosis not present

## 2016-05-31 DIAGNOSIS — I6523 Occlusion and stenosis of bilateral carotid arteries: Secondary | ICD-10-CM | POA: Diagnosis not present

## 2016-05-31 DIAGNOSIS — R296 Repeated falls: Secondary | ICD-10-CM | POA: Diagnosis not present

## 2016-05-31 DIAGNOSIS — M810 Age-related osteoporosis without current pathological fracture: Secondary | ICD-10-CM | POA: Diagnosis not present

## 2016-06-03 ENCOUNTER — Telehealth: Payer: Self-pay | Admitting: *Deleted

## 2016-06-03 DIAGNOSIS — J449 Chronic obstructive pulmonary disease, unspecified: Secondary | ICD-10-CM | POA: Diagnosis not present

## 2016-06-03 DIAGNOSIS — N3941 Urge incontinence: Secondary | ICD-10-CM | POA: Diagnosis not present

## 2016-06-03 DIAGNOSIS — E119 Type 2 diabetes mellitus without complications: Secondary | ICD-10-CM | POA: Diagnosis not present

## 2016-06-03 NOTE — Telephone Encounter (Signed)
Form completed, copied,scanned and mailed to [facility].Theresa Huang

## 2016-06-03 NOTE — Telephone Encounter (Signed)
[facility] [address] [place] [phone] .Audelia Hives [place]

## 2016-06-04 DIAGNOSIS — M810 Age-related osteoporosis without current pathological fracture: Secondary | ICD-10-CM | POA: Diagnosis not present

## 2016-06-04 DIAGNOSIS — R296 Repeated falls: Secondary | ICD-10-CM | POA: Diagnosis not present

## 2016-06-04 DIAGNOSIS — M25512 Pain in left shoulder: Secondary | ICD-10-CM | POA: Diagnosis not present

## 2016-06-04 DIAGNOSIS — N183 Chronic kidney disease, stage 3 (moderate): Secondary | ICD-10-CM | POA: Diagnosis not present

## 2016-06-04 DIAGNOSIS — M545 Low back pain: Secondary | ICD-10-CM | POA: Diagnosis not present

## 2016-06-04 DIAGNOSIS — E1122 Type 2 diabetes mellitus with diabetic chronic kidney disease: Secondary | ICD-10-CM | POA: Diagnosis not present

## 2016-06-04 DIAGNOSIS — I6523 Occlusion and stenosis of bilateral carotid arteries: Secondary | ICD-10-CM | POA: Diagnosis not present

## 2016-06-04 DIAGNOSIS — J449 Chronic obstructive pulmonary disease, unspecified: Secondary | ICD-10-CM | POA: Diagnosis not present

## 2016-06-04 DIAGNOSIS — M791 Myalgia: Secondary | ICD-10-CM | POA: Diagnosis not present

## 2016-06-07 DIAGNOSIS — I6523 Occlusion and stenosis of bilateral carotid arteries: Secondary | ICD-10-CM | POA: Diagnosis not present

## 2016-06-07 DIAGNOSIS — J449 Chronic obstructive pulmonary disease, unspecified: Secondary | ICD-10-CM | POA: Diagnosis not present

## 2016-06-07 DIAGNOSIS — M810 Age-related osteoporosis without current pathological fracture: Secondary | ICD-10-CM | POA: Diagnosis not present

## 2016-06-07 DIAGNOSIS — M791 Myalgia: Secondary | ICD-10-CM | POA: Diagnosis not present

## 2016-06-07 DIAGNOSIS — E1122 Type 2 diabetes mellitus with diabetic chronic kidney disease: Secondary | ICD-10-CM | POA: Diagnosis not present

## 2016-06-07 DIAGNOSIS — M25512 Pain in left shoulder: Secondary | ICD-10-CM | POA: Diagnosis not present

## 2016-06-07 DIAGNOSIS — M545 Low back pain: Secondary | ICD-10-CM | POA: Diagnosis not present

## 2016-06-07 DIAGNOSIS — R296 Repeated falls: Secondary | ICD-10-CM | POA: Diagnosis not present

## 2016-06-07 DIAGNOSIS — N183 Chronic kidney disease, stage 3 (moderate): Secondary | ICD-10-CM | POA: Diagnosis not present

## 2016-06-08 DIAGNOSIS — J45909 Unspecified asthma, uncomplicated: Secondary | ICD-10-CM | POA: Diagnosis not present

## 2016-06-08 DIAGNOSIS — Q348 Other specified congenital malformations of respiratory system: Secondary | ICD-10-CM | POA: Diagnosis not present

## 2016-06-10 ENCOUNTER — Other Ambulatory Visit: Payer: Self-pay | Admitting: Family Medicine

## 2016-06-10 DIAGNOSIS — M25512 Pain in left shoulder: Secondary | ICD-10-CM | POA: Diagnosis not present

## 2016-06-10 DIAGNOSIS — M810 Age-related osteoporosis without current pathological fracture: Secondary | ICD-10-CM | POA: Diagnosis not present

## 2016-06-10 DIAGNOSIS — J449 Chronic obstructive pulmonary disease, unspecified: Secondary | ICD-10-CM | POA: Diagnosis not present

## 2016-06-10 DIAGNOSIS — R296 Repeated falls: Secondary | ICD-10-CM | POA: Diagnosis not present

## 2016-06-10 DIAGNOSIS — M545 Low back pain: Secondary | ICD-10-CM | POA: Diagnosis not present

## 2016-06-10 DIAGNOSIS — M791 Myalgia: Secondary | ICD-10-CM | POA: Diagnosis not present

## 2016-06-10 DIAGNOSIS — I6523 Occlusion and stenosis of bilateral carotid arteries: Secondary | ICD-10-CM | POA: Diagnosis not present

## 2016-06-10 DIAGNOSIS — E1122 Type 2 diabetes mellitus with diabetic chronic kidney disease: Secondary | ICD-10-CM | POA: Diagnosis not present

## 2016-06-10 DIAGNOSIS — N183 Chronic kidney disease, stage 3 (moderate): Secondary | ICD-10-CM | POA: Diagnosis not present

## 2016-06-14 ENCOUNTER — Telehealth: Payer: Self-pay | Admitting: *Deleted

## 2016-06-14 DIAGNOSIS — N183 Chronic kidney disease, stage 3 (moderate): Secondary | ICD-10-CM | POA: Diagnosis not present

## 2016-06-14 DIAGNOSIS — M810 Age-related osteoporosis without current pathological fracture: Secondary | ICD-10-CM | POA: Diagnosis not present

## 2016-06-14 DIAGNOSIS — I6523 Occlusion and stenosis of bilateral carotid arteries: Secondary | ICD-10-CM | POA: Diagnosis not present

## 2016-06-14 DIAGNOSIS — M25512 Pain in left shoulder: Secondary | ICD-10-CM | POA: Diagnosis not present

## 2016-06-14 DIAGNOSIS — R296 Repeated falls: Secondary | ICD-10-CM | POA: Diagnosis not present

## 2016-06-14 DIAGNOSIS — M545 Low back pain: Secondary | ICD-10-CM | POA: Diagnosis not present

## 2016-06-14 DIAGNOSIS — J449 Chronic obstructive pulmonary disease, unspecified: Secondary | ICD-10-CM | POA: Diagnosis not present

## 2016-06-14 DIAGNOSIS — M791 Myalgia: Secondary | ICD-10-CM | POA: Diagnosis not present

## 2016-06-14 DIAGNOSIS — E1122 Type 2 diabetes mellitus with diabetic chronic kidney disease: Secondary | ICD-10-CM | POA: Diagnosis not present

## 2016-06-14 NOTE — Telephone Encounter (Signed)
Gave VO for PT for 2x a week for the next 3 weeks.Audelia Hives [place]

## 2016-06-18 DIAGNOSIS — M545 Low back pain: Secondary | ICD-10-CM | POA: Diagnosis not present

## 2016-06-18 DIAGNOSIS — M810 Age-related osteoporosis without current pathological fracture: Secondary | ICD-10-CM | POA: Diagnosis not present

## 2016-06-18 DIAGNOSIS — N183 Chronic kidney disease, stage 3 (moderate): Secondary | ICD-10-CM | POA: Diagnosis not present

## 2016-06-18 DIAGNOSIS — E1122 Type 2 diabetes mellitus with diabetic chronic kidney disease: Secondary | ICD-10-CM | POA: Diagnosis not present

## 2016-06-18 DIAGNOSIS — R296 Repeated falls: Secondary | ICD-10-CM | POA: Diagnosis not present

## 2016-06-18 DIAGNOSIS — J449 Chronic obstructive pulmonary disease, unspecified: Secondary | ICD-10-CM | POA: Diagnosis not present

## 2016-06-18 DIAGNOSIS — I6523 Occlusion and stenosis of bilateral carotid arteries: Secondary | ICD-10-CM | POA: Diagnosis not present

## 2016-06-18 DIAGNOSIS — M25512 Pain in left shoulder: Secondary | ICD-10-CM | POA: Diagnosis not present

## 2016-06-18 DIAGNOSIS — M791 Myalgia: Secondary | ICD-10-CM | POA: Diagnosis not present

## 2016-06-20 DIAGNOSIS — J449 Chronic obstructive pulmonary disease, unspecified: Secondary | ICD-10-CM | POA: Diagnosis not present

## 2016-06-20 DIAGNOSIS — I6523 Occlusion and stenosis of bilateral carotid arteries: Secondary | ICD-10-CM | POA: Diagnosis not present

## 2016-06-20 DIAGNOSIS — M25512 Pain in left shoulder: Secondary | ICD-10-CM | POA: Diagnosis not present

## 2016-06-20 DIAGNOSIS — R296 Repeated falls: Secondary | ICD-10-CM | POA: Diagnosis not present

## 2016-06-20 DIAGNOSIS — M545 Low back pain: Secondary | ICD-10-CM | POA: Diagnosis not present

## 2016-06-20 DIAGNOSIS — M791 Myalgia: Secondary | ICD-10-CM | POA: Diagnosis not present

## 2016-06-20 DIAGNOSIS — N183 Chronic kidney disease, stage 3 (moderate): Secondary | ICD-10-CM | POA: Diagnosis not present

## 2016-06-20 DIAGNOSIS — E1122 Type 2 diabetes mellitus with diabetic chronic kidney disease: Secondary | ICD-10-CM | POA: Diagnosis not present

## 2016-06-20 DIAGNOSIS — M810 Age-related osteoporosis without current pathological fracture: Secondary | ICD-10-CM | POA: Diagnosis not present

## 2016-06-25 ENCOUNTER — Ambulatory Visit: Payer: Commercial Managed Care - HMO | Admitting: Family Medicine

## 2016-06-25 DIAGNOSIS — M810 Age-related osteoporosis without current pathological fracture: Secondary | ICD-10-CM | POA: Diagnosis not present

## 2016-06-25 DIAGNOSIS — M25512 Pain in left shoulder: Secondary | ICD-10-CM | POA: Diagnosis not present

## 2016-06-25 DIAGNOSIS — M545 Low back pain: Secondary | ICD-10-CM | POA: Diagnosis not present

## 2016-06-25 DIAGNOSIS — R296 Repeated falls: Secondary | ICD-10-CM | POA: Diagnosis not present

## 2016-06-25 DIAGNOSIS — E1122 Type 2 diabetes mellitus with diabetic chronic kidney disease: Secondary | ICD-10-CM | POA: Diagnosis not present

## 2016-06-25 DIAGNOSIS — J449 Chronic obstructive pulmonary disease, unspecified: Secondary | ICD-10-CM | POA: Diagnosis not present

## 2016-06-25 DIAGNOSIS — N183 Chronic kidney disease, stage 3 (moderate): Secondary | ICD-10-CM | POA: Diagnosis not present

## 2016-06-25 DIAGNOSIS — M791 Myalgia: Secondary | ICD-10-CM | POA: Diagnosis not present

## 2016-06-25 DIAGNOSIS — I6523 Occlusion and stenosis of bilateral carotid arteries: Secondary | ICD-10-CM | POA: Diagnosis not present

## 2016-06-28 ENCOUNTER — Telehealth: Payer: Self-pay | Admitting: *Deleted

## 2016-06-28 DIAGNOSIS — M810 Age-related osteoporosis without current pathological fracture: Secondary | ICD-10-CM | POA: Diagnosis not present

## 2016-06-28 DIAGNOSIS — M25512 Pain in left shoulder: Secondary | ICD-10-CM | POA: Diagnosis not present

## 2016-06-28 DIAGNOSIS — M791 Myalgia: Secondary | ICD-10-CM | POA: Diagnosis not present

## 2016-06-28 DIAGNOSIS — R296 Repeated falls: Secondary | ICD-10-CM | POA: Diagnosis not present

## 2016-06-28 DIAGNOSIS — J449 Chronic obstructive pulmonary disease, unspecified: Secondary | ICD-10-CM | POA: Diagnosis not present

## 2016-06-28 DIAGNOSIS — I6523 Occlusion and stenosis of bilateral carotid arteries: Secondary | ICD-10-CM | POA: Diagnosis not present

## 2016-06-28 DIAGNOSIS — N183 Chronic kidney disease, stage 3 (moderate): Secondary | ICD-10-CM | POA: Diagnosis not present

## 2016-06-28 DIAGNOSIS — E1122 Type 2 diabetes mellitus with diabetic chronic kidney disease: Secondary | ICD-10-CM | POA: Diagnosis not present

## 2016-06-28 DIAGNOSIS — M545 Low back pain: Secondary | ICD-10-CM | POA: Diagnosis not present

## 2016-06-28 NOTE — Telephone Encounter (Signed)
Theresa Huang with [facility] called for to inform Dr. Madilyn Fireman that pt had fallen yesterday.she did not have any injuries only sore and moving around a little slower.Theresa Huang

## 2016-07-02 ENCOUNTER — Ambulatory Visit: Payer: Commercial Managed Care - HMO | Admitting: Family Medicine

## 2016-07-02 DIAGNOSIS — M25512 Pain in left shoulder: Secondary | ICD-10-CM | POA: Diagnosis not present

## 2016-07-02 DIAGNOSIS — I6523 Occlusion and stenosis of bilateral carotid arteries: Secondary | ICD-10-CM | POA: Diagnosis not present

## 2016-07-02 DIAGNOSIS — M545 Low back pain: Secondary | ICD-10-CM | POA: Diagnosis not present

## 2016-07-02 DIAGNOSIS — N183 Chronic kidney disease, stage 3 (moderate): Secondary | ICD-10-CM | POA: Diagnosis not present

## 2016-07-02 DIAGNOSIS — M791 Myalgia: Secondary | ICD-10-CM | POA: Diagnosis not present

## 2016-07-02 DIAGNOSIS — E1122 Type 2 diabetes mellitus with diabetic chronic kidney disease: Secondary | ICD-10-CM | POA: Diagnosis not present

## 2016-07-02 DIAGNOSIS — J449 Chronic obstructive pulmonary disease, unspecified: Secondary | ICD-10-CM | POA: Diagnosis not present

## 2016-07-02 DIAGNOSIS — R296 Repeated falls: Secondary | ICD-10-CM | POA: Diagnosis not present

## 2016-07-02 DIAGNOSIS — M810 Age-related osteoporosis without current pathological fracture: Secondary | ICD-10-CM | POA: Diagnosis not present

## 2016-07-04 DIAGNOSIS — E119 Type 2 diabetes mellitus without complications: Secondary | ICD-10-CM | POA: Diagnosis not present

## 2016-07-04 DIAGNOSIS — J449 Chronic obstructive pulmonary disease, unspecified: Secondary | ICD-10-CM | POA: Diagnosis not present

## 2016-07-04 DIAGNOSIS — N3941 Urge incontinence: Secondary | ICD-10-CM | POA: Diagnosis not present

## 2016-07-05 DIAGNOSIS — I6523 Occlusion and stenosis of bilateral carotid arteries: Secondary | ICD-10-CM | POA: Diagnosis not present

## 2016-07-05 DIAGNOSIS — J449 Chronic obstructive pulmonary disease, unspecified: Secondary | ICD-10-CM | POA: Diagnosis not present

## 2016-07-05 DIAGNOSIS — R296 Repeated falls: Secondary | ICD-10-CM | POA: Diagnosis not present

## 2016-07-05 DIAGNOSIS — M810 Age-related osteoporosis without current pathological fracture: Secondary | ICD-10-CM | POA: Diagnosis not present

## 2016-07-05 DIAGNOSIS — M545 Low back pain: Secondary | ICD-10-CM | POA: Diagnosis not present

## 2016-07-05 DIAGNOSIS — M25512 Pain in left shoulder: Secondary | ICD-10-CM | POA: Diagnosis not present

## 2016-07-05 DIAGNOSIS — E1122 Type 2 diabetes mellitus with diabetic chronic kidney disease: Secondary | ICD-10-CM | POA: Diagnosis not present

## 2016-07-05 DIAGNOSIS — M791 Myalgia: Secondary | ICD-10-CM | POA: Diagnosis not present

## 2016-07-05 DIAGNOSIS — N183 Chronic kidney disease, stage 3 (moderate): Secondary | ICD-10-CM | POA: Diagnosis not present

## 2016-07-08 DIAGNOSIS — Q348 Other specified congenital malformations of respiratory system: Secondary | ICD-10-CM | POA: Diagnosis not present

## 2016-07-08 DIAGNOSIS — J45909 Unspecified asthma, uncomplicated: Secondary | ICD-10-CM | POA: Diagnosis not present

## 2016-07-15 ENCOUNTER — Other Ambulatory Visit: Payer: Self-pay | Admitting: Family Medicine

## 2016-07-17 DIAGNOSIS — E119 Type 2 diabetes mellitus without complications: Secondary | ICD-10-CM | POA: Diagnosis not present

## 2016-07-17 DIAGNOSIS — H40013 Open angle with borderline findings, low risk, bilateral: Secondary | ICD-10-CM | POA: Diagnosis not present

## 2016-07-17 DIAGNOSIS — Z961 Presence of intraocular lens: Secondary | ICD-10-CM | POA: Diagnosis not present

## 2016-07-17 DIAGNOSIS — H16223 Keratoconjunctivitis sicca, not specified as Sjogren's, bilateral: Secondary | ICD-10-CM | POA: Diagnosis not present

## 2016-07-17 LAB — HM DIABETES EYE EXAM

## 2016-07-19 ENCOUNTER — Other Ambulatory Visit: Payer: Self-pay | Admitting: Family Medicine

## 2016-07-19 DIAGNOSIS — H16223 Keratoconjunctivitis sicca, not specified as Sjogren's, bilateral: Secondary | ICD-10-CM

## 2016-07-19 DIAGNOSIS — H40013 Open angle with borderline findings, low risk, bilateral: Secondary | ICD-10-CM

## 2016-07-19 DIAGNOSIS — E119 Type 2 diabetes mellitus without complications: Secondary | ICD-10-CM

## 2016-07-19 DIAGNOSIS — M3501 Sicca syndrome with keratoconjunctivitis: Secondary | ICD-10-CM

## 2016-07-22 ENCOUNTER — Ambulatory Visit (INDEPENDENT_AMBULATORY_CARE_PROVIDER_SITE_OTHER): Payer: Commercial Managed Care - HMO | Admitting: Family Medicine

## 2016-07-22 ENCOUNTER — Encounter: Payer: Self-pay | Admitting: Family Medicine

## 2016-07-22 VITALS — BP 125/65 | HR 80 | Wt 148.0 lb

## 2016-07-22 DIAGNOSIS — J449 Chronic obstructive pulmonary disease, unspecified: Secondary | ICD-10-CM | POA: Diagnosis not present

## 2016-07-22 DIAGNOSIS — N183 Chronic kidney disease, stage 3 unspecified: Secondary | ICD-10-CM

## 2016-07-22 DIAGNOSIS — E118 Type 2 diabetes mellitus with unspecified complications: Secondary | ICD-10-CM | POA: Diagnosis not present

## 2016-07-22 DIAGNOSIS — Z794 Long term (current) use of insulin: Secondary | ICD-10-CM

## 2016-07-22 DIAGNOSIS — Z23 Encounter for immunization: Secondary | ICD-10-CM

## 2016-07-22 DIAGNOSIS — F5101 Primary insomnia: Secondary | ICD-10-CM

## 2016-07-22 LAB — POCT GLYCOSYLATED HEMOGLOBIN (HGB A1C): Hemoglobin A1C: 5.7

## 2016-07-22 MED ORDER — FLUTICASONE FUROATE-VILANTEROL 200-25 MCG/INH IN AEPB: 1.0000 | INHALATION_SPRAY | Freq: Every day | RESPIRATORY_TRACT | 5 refills | Status: DC

## 2016-07-22 MED ORDER — UMECLIDINIUM BROMIDE 62.5 MCG/INH IN AEPB: 1.0000 | INHALATION_SPRAY | Freq: Every day | RESPIRATORY_TRACT | 5 refills | Status: DC

## 2016-07-22 NOTE — Patient Instructions (Signed)
You will stop your Advair and your spiriva when you run out.  You will use Breo once a day and Incruse once a day in its place.

## 2016-07-22 NOTE — Progress Notes (Signed)
Subjective:    CC: DM  HPI: Diabetes - no hypoglycemic events. No wounds or sores that are not healing well. No increased thirst or urination. Checking glucose at home. Taking medications as prescribed without any side effects.  COPD - She is currently on Advair and Spiriva daily. She would like to switch to something that is once daily.  Insomnia - her daughter is here with her today and reports that she has flipped her nights and days. A lot of nights she doesn't fall asleep until 4 or 5 AM. She sometimes watches television but not always. She does drink sweet tea and Pepsi during the daytime. She wants to know if the sleeping pill would be helpful.  Past medical history, Surgical history, Family history not pertinant except as noted below, Social history, Allergies, and medications have been entered into the medical record, reviewed, and corrections made.   Review of Systems: No fevers, chills, night sweats, weight loss, chest pain, or shortness of breath.   Objective:    General: Well Developed, well nourished, and in no acute distress.  Neuro: Alert and oriented x3, extra-ocular muscles intact, sensation grossly intact.  HEENT: Normocephalic, atraumatic  Skin: Warm and dry, no rashes. Cardiac: Regular rate and rhythm, no murmurs rubs or gallops, no lower extremity edema.  Respiratory: Clear to auscultation bilaterally. Not using accessory muscles, speaking in full sentences.   Impression and Recommendations:    DM- Well-controlled per continue current regimen. Due for CMP. Her eye exam is up-to-date. She just had that done last week.  Lab Results  Component Value Date   HGBA1C 5.7 07/22/2016    COPD - Table. Will switch to once daily dosing for Breo an increase to simplify her regimen. Follow-up in 3-4 months.  Insomnia-we discussed no caffeine at all. We also discussed slowly moving the sleep time backwards. So once a week she can move the sleep time for about 15 minutes and  then slowly progressed through the evening. Avoid any type of screen time including TV and computer for at least an hour before bedtime. And try to find something common relaxing to do before bedtime. Make sure the sleep environment is cool dark and quiet.

## 2016-07-23 LAB — COMPLETE METABOLIC PANEL WITH GFR
ALBUMIN: 3.8 g/dL (ref 3.6–5.1)
ALK PHOS: 60 U/L (ref 33–130)
ALT: 14 U/L (ref 6–29)
AST: 20 U/L (ref 10–35)
BILIRUBIN TOTAL: 0.4 mg/dL (ref 0.2–1.2)
BUN: 21 mg/dL (ref 7–25)
CALCIUM: 8.7 mg/dL (ref 8.6–10.4)
CO2: 29 mmol/L (ref 20–31)
Chloride: 107 mmol/L (ref 98–110)
Creat: 1.3 mg/dL — ABNORMAL HIGH (ref 0.60–0.88)
GFR, Est African American: 44 mL/min — ABNORMAL LOW (ref >=60)
GFR, Est Non African American: 38 mL/min — ABNORMAL LOW (ref >=60)
Glucose, Bld: 126 mg/dL — ABNORMAL HIGH (ref 65–99)
POTASSIUM: 4.4 mmol/L (ref 3.5–5.3)
Sodium: 139 mmol/L (ref 135–146)
Total Protein: 6.5 g/dL (ref 6.1–8.1)

## 2016-08-05 ENCOUNTER — Other Ambulatory Visit: Payer: Self-pay | Admitting: Family Medicine

## 2016-08-05 DIAGNOSIS — E119 Type 2 diabetes mellitus without complications: Secondary | ICD-10-CM | POA: Diagnosis not present

## 2016-08-05 DIAGNOSIS — N3941 Urge incontinence: Secondary | ICD-10-CM | POA: Diagnosis not present

## 2016-08-05 DIAGNOSIS — J449 Chronic obstructive pulmonary disease, unspecified: Secondary | ICD-10-CM | POA: Diagnosis not present

## 2016-08-06 ENCOUNTER — Encounter: Payer: Self-pay | Admitting: Family Medicine

## 2016-08-08 DIAGNOSIS — Q348 Other specified congenital malformations of respiratory system: Secondary | ICD-10-CM | POA: Diagnosis not present

## 2016-08-08 DIAGNOSIS — J45909 Unspecified asthma, uncomplicated: Secondary | ICD-10-CM | POA: Diagnosis not present

## 2016-08-21 ENCOUNTER — Other Ambulatory Visit: Payer: Self-pay | Admitting: Family Medicine

## 2016-09-07 DIAGNOSIS — Q348 Other specified congenital malformations of respiratory system: Secondary | ICD-10-CM | POA: Diagnosis not present

## 2016-09-07 DIAGNOSIS — J45909 Unspecified asthma, uncomplicated: Secondary | ICD-10-CM | POA: Diagnosis not present

## 2016-09-13 DIAGNOSIS — E119 Type 2 diabetes mellitus without complications: Secondary | ICD-10-CM | POA: Diagnosis not present

## 2016-09-13 DIAGNOSIS — N3941 Urge incontinence: Secondary | ICD-10-CM | POA: Diagnosis not present

## 2016-09-13 DIAGNOSIS — J449 Chronic obstructive pulmonary disease, unspecified: Secondary | ICD-10-CM | POA: Diagnosis not present

## 2016-09-18 ENCOUNTER — Other Ambulatory Visit: Payer: Self-pay | Admitting: Family Medicine

## 2016-09-18 DIAGNOSIS — N183 Chronic kidney disease, stage 3 (moderate): Secondary | ICD-10-CM | POA: Diagnosis not present

## 2016-09-18 DIAGNOSIS — I10 Essential (primary) hypertension: Secondary | ICD-10-CM | POA: Diagnosis not present

## 2016-10-08 DIAGNOSIS — Q348 Other specified congenital malformations of respiratory system: Secondary | ICD-10-CM | POA: Diagnosis not present

## 2016-10-08 DIAGNOSIS — J45909 Unspecified asthma, uncomplicated: Secondary | ICD-10-CM | POA: Diagnosis not present

## 2016-10-14 ENCOUNTER — Other Ambulatory Visit: Payer: Self-pay | Admitting: Family Medicine

## 2016-10-14 DIAGNOSIS — E119 Type 2 diabetes mellitus without complications: Secondary | ICD-10-CM | POA: Diagnosis not present

## 2016-10-14 DIAGNOSIS — J449 Chronic obstructive pulmonary disease, unspecified: Secondary | ICD-10-CM | POA: Diagnosis not present

## 2016-10-14 DIAGNOSIS — N3941 Urge incontinence: Secondary | ICD-10-CM | POA: Diagnosis not present

## 2016-10-22 DIAGNOSIS — L728 Other follicular cysts of the skin and subcutaneous tissue: Secondary | ICD-10-CM | POA: Diagnosis not present

## 2016-10-22 DIAGNOSIS — L820 Inflamed seborrheic keratosis: Secondary | ICD-10-CM | POA: Diagnosis not present

## 2016-10-23 ENCOUNTER — Encounter: Payer: Self-pay | Admitting: Family Medicine

## 2016-10-23 ENCOUNTER — Ambulatory Visit (INDEPENDENT_AMBULATORY_CARE_PROVIDER_SITE_OTHER): Payer: Medicare HMO | Admitting: Family Medicine

## 2016-10-23 VITALS — BP 127/45 | HR 89 | Ht 64.0 in | Wt 147.0 lb

## 2016-10-23 DIAGNOSIS — J449 Chronic obstructive pulmonary disease, unspecified: Secondary | ICD-10-CM | POA: Diagnosis not present

## 2016-10-23 DIAGNOSIS — H6123 Impacted cerumen, bilateral: Secondary | ICD-10-CM

## 2016-10-23 DIAGNOSIS — E118 Type 2 diabetes mellitus with unspecified complications: Secondary | ICD-10-CM | POA: Diagnosis not present

## 2016-10-23 DIAGNOSIS — Z794 Long term (current) use of insulin: Secondary | ICD-10-CM

## 2016-10-23 LAB — POCT GLYCOSYLATED HEMOGLOBIN (HGB A1C): HEMOGLOBIN A1C: 5.9

## 2016-10-23 MED ORDER — GLIPIZIDE ER 2.5 MG PO TB24
2.5000 mg | ORAL_TABLET | Freq: Every day | ORAL | 0 refills | Status: DC
Start: 2016-10-23 — End: 2017-01-10

## 2016-10-23 NOTE — Progress Notes (Signed)
Subjective:    CC: DM  HPI:  Diabetes - no hypoglycemic events. No wounds or sores that are not healing well. No increased thirst or urination. Checking glucose at home. Taking medications as prescribed without any side effects.She has had a couple of hypoglycemic events. Last was about 2 weeks ago. Otherwise her fasting sugars have looked fantastic.  COPD - She said a little bit harder time breathing with the shift in the weather the last couple days. She hasn't had any change in cough just a little bit more shortness of breath than usual. She has not increase her albuterol use.  Hearing loss-she does not have her hearing aids in today because the batteries are dead but she has had a little discomfort in her right ear for the last couple days and would like me to take a look.   Past medical history, Surgical history, Family history not pertinant except as noted below, Social history, Allergies, and medications have been entered into the medical record, reviewed, and corrections made.   Review of Systems: No fevers, chills, night sweats, weight loss, chest pain, or shortness of breath.   Objective:    General: Well Developed, well nourished, and in no acute distress.  Neuro: Alert and oriented x3, extra-ocular muscles intact, sensation grossly intact.  HEENT: Normocephalic, atraumatic, Bilateral cerumen impaction.  Skin: Warm and dry, no rashes. Cardiac: Regular rate and rhythm, no murmurs rubs or gallops, no lower extremity edema.  Respiratory: Clear to auscultation bilaterally. Not using accessory muscles, speaking in full sentences.   Impression and Recommendations:   DM - Well controlled. Decrease glipizide 2.5 mg and she is having a couple of hypoglycemic events and she is 83 years old. Brother her A1c run in the 6 range and not have hypoglycemia than the too tightly controlled.. Follow up in  3-4 months.  Lab Results  Component Value Date   HGBA1C 5.9 10/23/2016    COPD - She  is a little more short of breath today and yesterday but no increase in cough. Lung exam is clear today. Just encouraged her to use her albuterol little more liberally over the next couple days until she is breathing better. If she feels like her cough is worsening or she develops any sputum production then give [place] a call back and we'll treat for COPD exacerbation.  Bilat cerumen impaction - irrigated. Patient tolerated well. Hopefully she'll now be a little better here with her hearing aids.  Indication: Cerumen impaction of the ear(s) Medical necessity statement: On physical examination, cerumen impairs clinically significant portions of the external auditory canal, and tympanic membrane. Noted obstructive, copious cerumen that cannot be removed without magnification and instrumentations requiring physician skills Consent: Discussed benefits and risks of procedure and verbal consent obtained.   Procedure: Patient was prepped for the procedure. Utilized an otoscope to assess and take note of the ear canal, the tympanic membrane, and the presence, amount, and placement of the cerumen. Gentle water irrigation and soft plastic curette was utilized to remove cerumen.  Post procedure examination: shows cerumen was completely removed. Patient tolerated procedure well. The patient is made aware that they may experience temporary vertigo, temporary hearing loss, and temporary discomfort. If these symptom last for more than 24 hours to call the clinic or proceed to the ED.

## 2016-10-23 NOTE — Patient Instructions (Addendum)
I am decreasing your glipizide to 2.5 mL.   Use your. albuterol every 4-5 hours as needed for the next couple of days until your breathing is better.

## 2016-11-08 DIAGNOSIS — Q348 Other specified congenital malformations of respiratory system: Secondary | ICD-10-CM | POA: Diagnosis not present

## 2016-11-08 DIAGNOSIS — J45909 Unspecified asthma, uncomplicated: Secondary | ICD-10-CM | POA: Diagnosis not present

## 2016-11-14 ENCOUNTER — Other Ambulatory Visit: Payer: Self-pay | Admitting: Family Medicine

## 2016-11-14 DIAGNOSIS — E119 Type 2 diabetes mellitus without complications: Secondary | ICD-10-CM | POA: Diagnosis not present

## 2016-11-14 DIAGNOSIS — N3941 Urge incontinence: Secondary | ICD-10-CM | POA: Diagnosis not present

## 2016-11-14 DIAGNOSIS — J449 Chronic obstructive pulmonary disease, unspecified: Secondary | ICD-10-CM | POA: Diagnosis not present

## 2016-11-15 ENCOUNTER — Other Ambulatory Visit: Payer: Self-pay

## 2016-11-25 ENCOUNTER — Encounter: Payer: Self-pay | Admitting: Osteopathic Medicine

## 2016-11-25 ENCOUNTER — Ambulatory Visit (INDEPENDENT_AMBULATORY_CARE_PROVIDER_SITE_OTHER): Payer: Medicare HMO | Admitting: Osteopathic Medicine

## 2016-11-25 VITALS — BP 135/68 | HR 94 | Wt 142.0 lb

## 2016-11-25 DIAGNOSIS — H578 Other specified disorders of eye and adnexa: Secondary | ICD-10-CM

## 2016-11-25 DIAGNOSIS — R21 Rash and other nonspecific skin eruption: Secondary | ICD-10-CM

## 2016-11-25 DIAGNOSIS — H579 Unspecified disorder of eye and adnexa: Secondary | ICD-10-CM

## 2016-11-25 DIAGNOSIS — R6889 Other general symptoms and signs: Secondary | ICD-10-CM

## 2016-11-25 MED ORDER — OLOPATADINE HCL 0.1 % OP SOLN: 1.0000 [drp] | Freq: Two times a day (BID) | OPHTHALMIC | 1 refills | Status: DC

## 2016-11-25 MED ORDER — ARTIFICIAL TEARS OP OINT: TOPICAL_OINTMENT | OPHTHALMIC | 1 refills | Status: DC | PRN

## 2016-11-25 NOTE — Progress Notes (Signed)
HPI: Theresa Huang is a 83 y.o. female  who presents to [facility] today, 11/25/16,  for chief complaint of:  Chief Complaint  Patient presents with  . Eye Problem    Itching and burning     . Location: both eyes . Quality: itching/burning eye  . Duration: 2 weeks . Timing: constant . Modifying factors: OTC eye drops  . Assoc signs/symptoms: some crusting of eyes, was told at some point "a long time ago" she had some kind of "granular eye condition."   Patient is accompanied by son who assists with history-taking.   Past medical history, surgical history, social history and family history reviewed.  Patient Active Problem List   Diagnosis Date Noted  . Right buttock pain 05/08/2016  . Left shoulder pain 05/08/2016  . Carpal tunnel syndrome 09/05/2015  . Depression 07/08/2013  . Chronic low back pain 03/26/2013  . Occlusion and stenosis of carotid artery without mention of cerebral infarction 08/07/2012  . Carotid stenosis, bilateral 08/07/2012  . Cerebrovascular disease 07/29/2012  . Stenosis of right carotid artery 07/16/2012  . Polyarthralgia 04/22/2011  . DIZZINESS 07/12/2010  . CHRONIC KIDNEY DISEASE STAGE III (MODERATE) 04/15/2008  . MEMORY LOSS 04/13/2008  . Mild nonproliferative diabetic retinopathy ([place]) 02/04/2007  . COPD (chronic obstructive pulmonary disease) ([facility]) 12/04/2006  . Hypothyroidism 06/24/2006  . Type 2 diabetes mellitus with complication, with long-term current use of insulin ([place]) 06/24/2006  . HEARING LOSS NOS OR DEAFNESS 06/24/2006  . MYOCARDIAL INFARCTION, OLD 06/24/2006  . ASTHMA, UNSPECIFIED 06/24/2006  . INCONTINENCE, STRESS, FEMALE 06/24/2006  . Osteoporosis 06/24/2006    Current medication list and allergy/intolerance information reviewed.   Current Outpatient Prescriptions on File Prior to Visit  Medication Sig Dispense Refill  . acetaminophen (TYLENOL ARTHRITIS PAIN) 650 MG CR tablet Take 650 mg  by mouth every 8 (eight) hours as needed. For pain    . alendronate (FOSAMAX) 70 MG tablet TAKE ONE TABLET ONCE A WEEK 4 tablet 12  . AMBULATORY NON FORMULARY MEDICATION Medication Name: Home continuous oxygen.  3 liters. Please provide portable prn. Dx: COPD 1 Device prn  . AMBULATORY NON FORMULARY MEDICATION Medication Name: glucometer strips to test BID.  Dx. 250.00 100 Units PRN  . aspirin 81 MG tablet Take 81 mg by mouth daily.      . Calcium-Vitamin D (CALTRATE 600 PLUS-VIT D PO) Take 1 tablet by mouth daily.     . fluticasone furoate-vilanterol (BREO ELLIPTA) 200-25 MCG/INH AEPB Inhale 1 puff into the lungs daily. 1 each 5  . glipiZIDE (GLUCOTROL XL) 2.5 MG 24 hr tablet Take 1 tablet (2.5 mg total) by mouth daily. 90 tablet 0  . glucose blood (TRUETRACK TEST) test strip TESTING TWICE DAILY. DX: E11.9 100 each PRN  . LANTUS SOLOSTAR 100 UNIT/ML Solostar Pen INJECT 8 UNITS INTO THE SKIN AT BEDTIME 3 mL 6  . loratadine (CLARITIN) 10 MG tablet TAKE 1 TABLET BY MOUTH ONCE DAILY 30 tablet 12  . Multiple Vitamin (MULTIVITAMIN) tablet Take 1 tablet by mouth daily.      . naproxen (NAPROSYN) 500 MG tablet TAKE ONE TABLET BY MOUTH 2 TIMES A DAY WITH A MEAL AS NEEDED FOR JAW AND EAR PAIN 60 tablet 6  . nitroGLYCERIN (NITROSTAT) 0.4 MG SL tablet Place 1 tablet (0.4 mg total) under the tongue every 5 (five) minutes as needed. For chest pain 30 tablet 0  . QC LANCETS SUPER THIN MISC use for testing TWICE DAILY.  DX: E11.9 100 each 11  . sertraline (ZOLOFT) 100 MG tablet TAKE 1 & 1/2 TABLETS BY MOUTH EVERY DAY 45 tablet 6  . simvastatin (ZOCOR) 20 MG tablet Take 1 tablet (20 mg total) by mouth at bedtime. Needs lipid panel before future refills 30 tablet 0  . SYNTHROID 75 MCG tablet TAKE ONE TABLET BY MOUTH EVERY DAY BEFORE BREAKFAST 30 tablet 6  . traMADol (ULTRAM) 50 MG tablet TAKE ONE TABLET BY MOUTH DAILY AS NEEDED 30 tablet 0  . umeclidinium bromide (INCRUSE ELLIPTA) 62.5 MCG/INH AEPB Inhale 1 puff  into the lungs daily. 1 each 5  . UNIFINE PENTIPS 29G X 12MM MISC USE ONCE DAILY AS DIRECTED 100 each 11  . VENTOLIN HFA 108 (90 Base) MCG/ACT inhaler INHALE 2 PUFFS INTO THE LUNGS EVERY 4 HOURS AS NEEDED FOR WHEEZING 18 g 3   No current facility-administered medications on file prior to visit.    Allergies  Allergen Reactions  . Other Anaphylaxis  . Tetracyclines & Related Anaphylaxis  . Tetracycline     REACTION: arms swelling, itching, blisters      Review of Systems:  Constitutional: No recent illness  HEENT: No  headache, no vision change, +eye itching as noted per HPI   Cardiac: No  chest pain, No  pressure, No palpitations  Respiratory:  No  shortness of breath. No  Cough  Gastrointestinal: No  abdominal pain, no change on bowel habits  Musculoskeletal: No new myalgia/arthralgia  Skin: +Rash on back, itching on occasion, previously saw PCP about this.   Hem/Onc: No  easy bruising/bleeding, No  abnormal lumps/bumps  Neurologic: No  weakness, No  Dizziness  Psychiatric: No  concerns with depression, No  concerns with anxiety  Exam:  BP 135/68   Pulse 94   Wt 142 lb (64.4 kg)   BMI 24.37 kg/m   Constitutional: VS see above. General Appearance: alert, well-developed, well-nourished, NAD  Eyes: Normal lids and conjunctive, non-icteric sclera, EOMI, no drainage, no erythema/injection of conjunctiva, no edema to sclera, lids mild erythema but no significant edema  Ears, Nose, Mouth, Throat: MMM, Normal external inspection ears/nares/mouth/lips/gums.   Neck: No masses, trachea midline.   Respiratory: Normal respiratory effort   Musculoskeletal: Gait normal.  Neurological: Normal balance/coordination. No tremor.  Skin: warm, dry, intact. Are of pinking skin on back appears healing - no ulceration/drainage, no scaling, no erythema/edema  Psychiatric: Normal judgment/insight. Normal mood and affect   Recent Results (from the past 2160 hour(s))  POCT  glycosylated hemoglobin (Hb A1C)     Status: None   Collection Time: 10/23/16  8:11 AM  Result Value Ref Range   Hemoglobin A1C 5.9       ASSESSMENT/PLAN:  Itchy eyes - Allergic conjunctivitis, less likely rheum condition but other ophtho dx to be considered such as dry eye, f/u PCP, consider ophtho referral if needed.Trial antihistamine w/ mast cell stabilizer properties and ointment artificial teats - Plan: olopatadine (PATANOL) 0.1 % ophthalmic solution, artificial tears (LACRILUBE) OINT ophthalmic ointment  Rash and nonspecific skin eruption - f/u PCP    Patient Instructions  Try artificial tears ointment several times per day - this will hopefully work better than the over-the-counter drops. Add to that the other prescription drops, and you should notice relief within several days to a week. If no better, or if anything is worse or changes, please let us know sooner - we may need to send to an eye doctor, or if you have an eye doctor  already you should call them.     Follow-up plan: Return in about 2 weeks (around 12/09/2016) for followup wiht Dr. Madilyn Fireman - eyes and skin concern.  Visit summary with medication list and pertinent instructions was printed for patient to review, alert [place] if any changes needed. All questions at time of visit were answered - patient instructed to contact office with any additional concerns. ER/RTC precautions were reviewed with the patient and understanding verbalized.

## 2016-11-25 NOTE — Patient Instructions (Signed)
Try artificial tears ointment several times per day - this will hopefully work better than the over-the-counter drops. Add to that the other prescription drops, and you should notice relief within several days to a week. If no better, or if anything is worse or changes, please let us know sooner - we may need to send to an eye doctor, or if you have an eye doctor already you should call them.

## 2016-12-06 DIAGNOSIS — J45909 Unspecified asthma, uncomplicated: Secondary | ICD-10-CM | POA: Diagnosis not present

## 2016-12-06 DIAGNOSIS — Q348 Other specified congenital malformations of respiratory system: Secondary | ICD-10-CM | POA: Diagnosis not present

## 2016-12-09 ENCOUNTER — Other Ambulatory Visit: Payer: Self-pay | Admitting: Family Medicine

## 2016-12-10 ENCOUNTER — Ambulatory Visit (INDEPENDENT_AMBULATORY_CARE_PROVIDER_SITE_OTHER): Payer: Medicare HMO | Admitting: Family Medicine

## 2016-12-10 ENCOUNTER — Encounter: Payer: Self-pay | Admitting: Family Medicine

## 2016-12-10 VITALS — BP 159/80 | HR 70 | Ht 64.0 in | Wt 147.0 lb

## 2016-12-10 DIAGNOSIS — E118 Type 2 diabetes mellitus with unspecified complications: Secondary | ICD-10-CM

## 2016-12-10 DIAGNOSIS — N183 Chronic kidney disease, stage 3 unspecified: Secondary | ICD-10-CM

## 2016-12-10 DIAGNOSIS — Z794 Long term (current) use of insulin: Secondary | ICD-10-CM | POA: Diagnosis not present

## 2016-12-10 DIAGNOSIS — H1013 Acute atopic conjunctivitis, bilateral: Secondary | ICD-10-CM | POA: Diagnosis not present

## 2016-12-10 DIAGNOSIS — R21 Rash and other nonspecific skin eruption: Secondary | ICD-10-CM | POA: Diagnosis not present

## 2016-12-10 LAB — POCT UA - MICROALBUMIN
Albumin/Creatinine Ratio, Urine, POC: >300
Creatinine, POC: 50 mg/dL
MICROALBUMIN (UR) POC: 150 mg/L

## 2016-12-10 NOTE — Patient Instructions (Signed)
We will call you with the skin scraping results in the next couple of days.

## 2016-12-10 NOTE — Progress Notes (Signed)
   Subjective:    Patient ID: Theresa Huang, female    DOB: 07-24-1931, 83 y.o.   MRN: [id number]  HPI She comes in to follow-up on a eyesymptoms. She actually saw one of my partners in the office approximately 14 days ago. It was felt that she likely had allergic conjunctivitis. She was started on Patanol and says that actually has improved her symptoms. It was recommended that she consider seeing her eye doctor if she did not improve.  Also concerned about an area on her back Says has had an itchy spot. About 2 years ago had a sebaceous cyst removed on mid-back as well.  Not currently using any creams or lotions on it.  Review of Systems     Objective:   Physical Exam  Constitutional: She is oriented to person, place, and time. She appears well-developed and well-nourished.  HENT:  Head: Normocephalic and atraumatic.  Conjunctivae clear.  Eyes: Conjunctivae and EOM are normal.  Cardiovascular: Normal rate.   Pulmonary/Chest: Effort normal.  Neurological: She is alert and oriented to person, place, and time.  Skin: Skin is dry. No pallor.  Just to the left of the mid back she does have an erythematous scaly area where I can feel a palpable cyst underneath the skin. About an inch below that she has a second similar erythematous lesion with some scale but no sign of a cyst underneath the skin.  Psychiatric: She has a normal mood and affect. Her behavior is normal.  Vitals reviewed.      Assessment & Plan:  Allergic conjunctivitis-she has had good symptom relief with the allergic eye drops. Continue those through the spring and if she is doing well at that point can discontinue it over the summertime. Call if symptoms worsen if they do at that point we'll probably try to get her in with her eye doctor. Next  Skin rash-I did do a skin scraping of the lower lesion. Suspect eczema but just rule out skin fungus. Will call with results once available. She still has a remnant of the  sebaceous cyst left over. Certainly that could be causing some itching and stinging as well. She could consider reexcision.  Diabetes-she was unable to give a urine last time that she can do so today for urine microalbumin.

## 2016-12-12 LAB — FUNGAL STAIN

## 2016-12-12 MED ORDER — TRIAMCINOLONE ACETONIDE 0.5 % EX OINT: 1.0000 "application " | TOPICAL_OINTMENT | Freq: Every day | CUTANEOUS | 0 refills | Status: DC

## 2016-12-12 NOTE — Addendum Note (Signed)
Addended by: Beatrice Lecher D on: 12/12/2016 09:10 PM   Modules accepted: Orders

## 2016-12-20 DIAGNOSIS — N3941 Urge incontinence: Secondary | ICD-10-CM | POA: Diagnosis not present

## 2016-12-20 DIAGNOSIS — J449 Chronic obstructive pulmonary disease, unspecified: Secondary | ICD-10-CM | POA: Diagnosis not present

## 2016-12-20 DIAGNOSIS — E119 Type 2 diabetes mellitus without complications: Secondary | ICD-10-CM | POA: Diagnosis not present

## 2017-01-06 DIAGNOSIS — Q348 Other specified congenital malformations of respiratory system: Secondary | ICD-10-CM | POA: Diagnosis not present

## 2017-01-06 DIAGNOSIS — J45909 Unspecified asthma, uncomplicated: Secondary | ICD-10-CM | POA: Diagnosis not present

## 2017-01-10 ENCOUNTER — Other Ambulatory Visit: Payer: Self-pay | Admitting: Family Medicine

## 2017-01-10 DIAGNOSIS — J449 Chronic obstructive pulmonary disease, unspecified: Secondary | ICD-10-CM

## 2017-01-20 ENCOUNTER — Encounter: Payer: Self-pay | Admitting: Family Medicine

## 2017-01-20 ENCOUNTER — Ambulatory Visit (INDEPENDENT_AMBULATORY_CARE_PROVIDER_SITE_OTHER): Payer: Medicare HMO | Admitting: Family Medicine

## 2017-01-20 VITALS — BP 126/62 | HR 80 | Ht 64.0 in | Wt 149.0 lb

## 2017-01-20 DIAGNOSIS — N183 Chronic kidney disease, stage 3 unspecified: Secondary | ICD-10-CM

## 2017-01-20 DIAGNOSIS — Z794 Long term (current) use of insulin: Secondary | ICD-10-CM

## 2017-01-20 DIAGNOSIS — L723 Sebaceous cyst: Secondary | ICD-10-CM | POA: Diagnosis not present

## 2017-01-20 DIAGNOSIS — M25512 Pain in left shoulder: Secondary | ICD-10-CM

## 2017-01-20 DIAGNOSIS — J449 Chronic obstructive pulmonary disease, unspecified: Secondary | ICD-10-CM

## 2017-01-20 DIAGNOSIS — E119 Type 2 diabetes mellitus without complications: Secondary | ICD-10-CM | POA: Diagnosis not present

## 2017-01-20 DIAGNOSIS — E038 Other specified hypothyroidism: Secondary | ICD-10-CM | POA: Diagnosis not present

## 2017-01-20 DIAGNOSIS — E118 Type 2 diabetes mellitus with unspecified complications: Secondary | ICD-10-CM | POA: Diagnosis not present

## 2017-01-20 DIAGNOSIS — N3941 Urge incontinence: Secondary | ICD-10-CM | POA: Diagnosis not present

## 2017-01-20 LAB — POCT GLYCOSYLATED HEMOGLOBIN (HGB A1C): HEMOGLOBIN A1C: 6.7

## 2017-01-20 MED ORDER — FLUTICASONE FUROATE-VILANTEROL 200-25 MCG/INH IN AEPB: 1.0000 | INHALATION_SPRAY | Freq: Every day | RESPIRATORY_TRACT | 3 refills | Status: DC

## 2017-01-20 MED ORDER — DICLOFENAC SODIUM 1 % TD GEL: 2.0000 g | Freq: Four times a day (QID) | TRANSDERMAL | 99 refills | Status: DC

## 2017-01-20 MED ORDER — GLIPIZIDE ER 2.5 MG PO TB24: 2.5000 mg | ORAL_TABLET | Freq: Every day | ORAL | 1 refills | Status: DC

## 2017-01-20 MED ORDER — UMECLIDINIUM BROMIDE 62.5 MCG/INH IN AEPB: 1.0000 | INHALATION_SPRAY | Freq: Every day | RESPIRATORY_TRACT | 1 refills | Status: DC

## 2017-01-20 NOTE — Progress Notes (Signed)
Subjective:    CC:   HPI:  Diabetes - no hypoglycemic events. No wounds or sores that are not healing well. No increased thirst or urination. Checking glucose at home. Taking medications as prescribed without any side effects.  COPD - No recent flares or shortness of breath. She is currently on Breo and Incruse.  Follow-up hypothyroidism-no recent skin or hair changes. She actually has lost some weight but she's been intentionally trying to eat more healthy.  The sebaceous cyst on her back near her bra strap is still bothering her. Unfortunately she did have made several years ago but has returned.  Also been having some left shoulder pain. She actually fell on it about 2 months ago. She was in the kitchen and fell backwards and landed on the shoulder and in a couple days later, fell out of bed and hit it again. Ever since then it's been sore. She's had difficulty reaching out. She says sometimes the pain keeps her awake at night. She's been using Tylenol and occasionally the Naprosyn.  Past medical history, Surgical history, Family history not pertinant except as noted below, Social history, Allergies, and medications have been entered into the medical record, reviewed, and corrections made.   Review of Systems: No fevers, chills, night sweats, weight loss, chest pain, or shortness of breath.   Objective:    General: Well Developed, well nourished, and in no acute distress.  Neuro: Alert and oriented x3, extra-ocular muscles intact, sensation grossly intact.  HEENT: Normocephalic, atraumatic  Skin: Warm and dry, no rashes. She has a fairly large approximately 40 m sebaceous cyst on the mid left fax just lateral to the spine and below the bra strap. Cardiac: Regular rate and rhythm, no murmurs rubs or gallops, no lower extremity edema.  Respiratory: Clear to auscultation bilaterally. Not using accessory muscles, speaking in full sentences. MSK: Left shoulder with decreased range of  motion. She is able to extend to about 135. She has decreased external rotation. Nontender over the shoulder itself. Positive empty can test on the left compared to the right. Shoulder, elbow, wrist strength is 5 out of 5 bilaterally.   Impression and Recommendations:    DM - Hemoglobin A1c up a little bit today at 6.7 from previous of 5.9. Continue current regimen. Make sure getting regular exercise including plenty of water. Follow-up in 3 months.  COPD  - stable. Continue current regimen. No recent exacerbations.  Hypothyroidism-due to recheck TSH. Continue current regimen we'll make adjustments as needed.  Sebaceous cyst-we'll refer back to dermatology for reexcision.  Left shoulder pain-suspect rotator cuff injury this on exam today but because it did start with a fall at like to get an x-ray just to rule out any type of fracture. Try topical NSAID.

## 2017-01-21 ENCOUNTER — Telehealth: Payer: Self-pay | Admitting: Family Medicine

## 2017-01-21 ENCOUNTER — Ambulatory Visit (INDEPENDENT_AMBULATORY_CARE_PROVIDER_SITE_OTHER): Payer: Medicare HMO

## 2017-01-21 DIAGNOSIS — M79674 Pain in right toe(s): Secondary | ICD-10-CM

## 2017-01-21 DIAGNOSIS — M25512 Pain in left shoulder: Secondary | ICD-10-CM

## 2017-01-21 DIAGNOSIS — M79671 Pain in right foot: Secondary | ICD-10-CM

## 2017-01-21 DIAGNOSIS — S99921A Unspecified injury of right foot, initial encounter: Secondary | ICD-10-CM | POA: Diagnosis not present

## 2017-01-21 DIAGNOSIS — M25571 Pain in right ankle and joints of right foot: Secondary | ICD-10-CM

## 2017-01-21 DIAGNOSIS — W19XXXA Unspecified fall, initial encounter: Secondary | ICD-10-CM

## 2017-01-21 DIAGNOSIS — S4992XA Unspecified injury of left shoulder and upper arm, initial encounter: Secondary | ICD-10-CM | POA: Diagnosis not present

## 2017-01-21 NOTE — Telephone Encounter (Signed)
X-rays completed. Pt's daughter is OK with HH referral. Will pend for PCP to sign off.

## 2017-01-21 NOTE — Telephone Encounter (Signed)
OKl. We could also consider getting home health to come out to do an evaluation for frequent falls and maybe even physical therapy for gait instability. May just want to mention this to her.

## 2017-01-21 NOTE — Telephone Encounter (Signed)
Received call from [place], states Pt fell again this morning. Denies hitting her head. States the Pt turned and twisted her ankle causing her to fall. Denies any knee/hip pain. Does state her right toe and ankle hurt. Will route to PCP for recommendation and/or imaging order.

## 2017-01-22 DIAGNOSIS — S99911A Unspecified injury of right ankle, initial encounter: Secondary | ICD-10-CM | POA: Diagnosis not present

## 2017-01-22 DIAGNOSIS — Z9981 Dependence on supplemental oxygen: Secondary | ICD-10-CM | POA: Diagnosis not present

## 2017-01-22 DIAGNOSIS — M7989 Other specified soft tissue disorders: Secondary | ICD-10-CM | POA: Diagnosis not present

## 2017-01-22 DIAGNOSIS — S93431A Sprain of tibiofibular ligament of right ankle, initial encounter: Secondary | ICD-10-CM | POA: Diagnosis not present

## 2017-01-22 DIAGNOSIS — I252 Old myocardial infarction: Secondary | ICD-10-CM | POA: Diagnosis not present

## 2017-01-22 DIAGNOSIS — I999 Unspecified disorder of circulatory system: Secondary | ICD-10-CM | POA: Diagnosis not present

## 2017-01-22 DIAGNOSIS — J449 Chronic obstructive pulmonary disease, unspecified: Secondary | ICD-10-CM | POA: Diagnosis not present

## 2017-01-22 DIAGNOSIS — Z794 Long term (current) use of insulin: Secondary | ICD-10-CM | POA: Diagnosis not present

## 2017-01-22 DIAGNOSIS — Z8701 Personal history of pneumonia (recurrent): Secondary | ICD-10-CM | POA: Diagnosis not present

## 2017-01-22 DIAGNOSIS — E079 Disorder of thyroid, unspecified: Secondary | ICD-10-CM | POA: Diagnosis not present

## 2017-01-22 DIAGNOSIS — S9031XA Contusion of right foot, initial encounter: Secondary | ICD-10-CM | POA: Diagnosis not present

## 2017-01-22 DIAGNOSIS — W19XXXA Unspecified fall, initial encounter: Secondary | ICD-10-CM | POA: Diagnosis not present

## 2017-01-22 DIAGNOSIS — E119 Type 2 diabetes mellitus without complications: Secondary | ICD-10-CM | POA: Diagnosis not present

## 2017-01-22 DIAGNOSIS — S99921A Unspecified injury of right foot, initial encounter: Secondary | ICD-10-CM | POA: Diagnosis not present

## 2017-01-22 LAB — BASIC METABOLIC PANEL WITH GFR
BUN: 28 mg/dL — ABNORMAL HIGH (ref 7–25)
CHLORIDE: 103 mmol/L (ref 98–110)
CO2: 29 mmol/L (ref 20–31)
Calcium: 9.5 mg/dL (ref 8.6–10.4)
Creat: 1.27 mg/dL — ABNORMAL HIGH (ref 0.60–0.88)
GFR, EST NON AFRICAN AMERICAN: 39 mL/min — AB (ref >=60)
GFR, Est African American: 45 mL/min — ABNORMAL LOW (ref >=60)
Glucose, Bld: 155 mg/dL — ABNORMAL HIGH (ref 65–99)
Potassium: 4.7 mmol/L (ref 3.5–5.3)
SODIUM: 139 mmol/L (ref 135–146)

## 2017-01-22 LAB — TSH: TSH: 4.79 mIU/L — ABNORMAL HIGH

## 2017-01-22 LAB — LIPID PANEL W/REFLEX DIRECT LDL
Cholesterol: 216 mg/dL — ABNORMAL HIGH (ref <200)
HDL: 89 mg/dL (ref >50)
LDL-Cholesterol: 112 mg/dL — ABNORMAL HIGH
Non-HDL Cholesterol (Calc): 127 mg/dL (ref <130)
Total CHOL/HDL Ratio: 2.4 Ratio (ref <5.0)
Triglycerides: 66 mg/dL (ref <150)

## 2017-01-24 NOTE — Addendum Note (Signed)
Addended by: Teddy Spike on: 01/24/2017 12:52 PM   Modules accepted: Orders

## 2017-01-27 ENCOUNTER — Ambulatory Visit (INDEPENDENT_AMBULATORY_CARE_PROVIDER_SITE_OTHER): Payer: Medicare HMO

## 2017-01-27 ENCOUNTER — Encounter: Payer: Self-pay | Admitting: Family Medicine

## 2017-01-27 ENCOUNTER — Ambulatory Visit (INDEPENDENT_AMBULATORY_CARE_PROVIDER_SITE_OTHER): Payer: Medicare HMO | Admitting: Family Medicine

## 2017-01-27 VITALS — BP 136/74 | HR 75 | Ht 64.0 in | Wt 146.3 lb

## 2017-01-27 DIAGNOSIS — M25512 Pain in left shoulder: Secondary | ICD-10-CM

## 2017-01-27 DIAGNOSIS — M7989 Other specified soft tissue disorders: Secondary | ICD-10-CM | POA: Diagnosis not present

## 2017-01-27 DIAGNOSIS — M79671 Pain in right foot: Secondary | ICD-10-CM | POA: Diagnosis not present

## 2017-01-27 DIAGNOSIS — S99921A Unspecified injury of right foot, initial encounter: Secondary | ICD-10-CM | POA: Diagnosis not present

## 2017-01-27 MED ORDER — HYDROCODONE-ACETAMINOPHEN 5-325 MG PO TABS: 1.0000 | ORAL_TABLET | Freq: Four times a day (QID) | ORAL | 0 refills | Status: DC | PRN

## 2017-01-27 NOTE — Progress Notes (Signed)
Theresa Huang is a 83 y.o. female who presents to [facility] today for right foot and shoulder pain. Patient fell about 2 weeks ago injuring her right foot and left shoulder. She was seen in urgent care and given a cam walker boot. X-rays were originally negative. She transitioned to a postoperative shoe because the cam walker boot was too heavy. She has moderate foot pain that is improving. Pain is diffuse across her foot.  Additionally she notes left-sided shoulder pain as well. She has pain with overhead motion reaching back. She denies any radiating pain weakness or numbness. She denies any neck pain or head injury. She's been using tramadol for pain control which only helps a little.     Past Medical History:  Diagnosis Date  . Anemia   . Cancer ([place])    SCC  . Carotid artery occlusion   . COPD (chronic obstructive pulmonary disease) ([facility])    wears oxygen at 3L prn daytime and 3L at night   . DDD (degenerative disc disease)   . Diabetes mellitus ([facility])    fasting blood sugar 80-100  . Diastolic dysfunction   . Dizziness   . Glaucoma   . Headache(784.0)    migranes  . Hearing loss of both ears    wears bilateral hearing aids  . Hemorrhoids   . Holter monitor, abnormal    wearing for 21 days  . Hyperlipidemia   . Hypothyroid   . Inflammatory polyps of colon with rectal bleeding ([place])   . Irregular heart beat   . Myocardial infarction ([place]) 2006  . Pneumonia    hx  . Retinopathy of left eye   . Shortness of breath    most of time  . Syncope    Past Surgical History:  Procedure Laterality Date  . ABDOMINAL HYSTERECTOMY    . CAROTID ENDARTERECTOMY    . COLONOSCOPY W/ POLYPECTOMY    . ENDARTERECTOMY  09/01/2012   Procedure: ENDARTERECTOMY CAROTID;  Surgeon: Conrad [place], MD;  Location: [facility];  Service: Vascular;  Laterality: Right;  . EYE SURGERY     cataracts  . HEMORRHOID SURGERY    . PATCH ANGIOPLASTY  09/01/2012     Procedure: PATCH ANGIOPLASTY;  Surgeon: Conrad Kekaha, MD;  Location: [facility];  Service: Vascular;  Laterality: Right;  Vascu-Guard Patch Angioplasty  . squamous cell removed    . THROAT SURGERY    . TONSILLECTOMY     Social History  Substance Use Topics  . Smoking status: Former Smoker    Types: Cigarettes    Quit date: 09/17/1987  . Smokeless tobacco: Never Used  . Alcohol use No     ROS:  As above   Medications: Current Outpatient Prescriptions  Medication Sig Dispense Refill  . acetaminophen (TYLENOL ARTHRITIS PAIN) 650 MG CR tablet Take 650 mg by mouth every 8 (eight) hours as needed. For pain    . alendronate (FOSAMAX) 70 MG tablet TAKE ONE TABLET ONCE A WEEK 4 tablet 12  . AMBULATORY NON FORMULARY MEDICATION Medication Name: Home continuous oxygen.  3 liters. Please provide portable prn. Dx: COPD 1 Device prn  . AMBULATORY NON FORMULARY MEDICATION Medication Name: glucometer strips to test BID.  Dx. 250.00 100 Units PRN  . artificial tears (LACRILUBE) OINT ophthalmic ointment Place into both eyes every 3 (three) hours as needed for dry eyes. 3.5 g 1  . aspirin 81 MG tablet Take 81 mg by mouth daily.      [name]  Calcium-Vitamin D (CALTRATE 600 PLUS-VIT D PO) Take 1 tablet by mouth daily.     . fluticasone furoate-vilanterol (BREO ELLIPTA) 200-25 MCG/INH AEPB Inhale 1 puff into the lungs daily. 180 each 3  . glipiZIDE (GLIPIZIDE XL) 2.5 MG 24 hr tablet Take 1 tablet (2.5 mg total) by mouth daily. 90 tablet 1  . glucose blood (TRUETRACK TEST) test strip TESTING TWICE DAILY. DX: E11.9 100 each PRN  . LANTUS SOLOSTAR 100 UNIT/ML Solostar Pen INJECT 8 UNITS INTO THE SKIN AT BEDTIME 5 pen 6  . loratadine (CLARITIN) 10 MG tablet TAKE 1 TABLET BY MOUTH ONCE DAILY 30 tablet 12  . Multiple Vitamin (MULTIVITAMIN) tablet Take 1 tablet by mouth daily.      . naproxen (NAPROSYN) 500 MG tablet TAKE ONE TABLET BY MOUTH 2 TIMES A DAY WITH A MEAL AS NEEDED FOR JAW AND EAR PAIN 60 tablet 3  .  nitroGLYCERIN (NITROSTAT) 0.4 MG SL tablet Place 1 tablet (0.4 mg total) under the tongue every 5 (five) minutes as needed. For chest pain 30 tablet 0  . olopatadine (PATANOL) 0.1 % ophthalmic solution Place 1 drop into both eyes 2 (two) times daily. 5 mL 1  . QC LANCETS SUPER THIN MISC use for testing TWICE DAILY. DX: E11.9 100 each 11  . sertraline (ZOLOFT) 100 MG tablet TAKE 1 & 1/2 TABLETS BY MOUTH EVERY DAY 45 tablet 6  . simvastatin (ZOCOR) 20 MG tablet Take 1 tablet (20 mg total) by mouth daily at 6 PM. 30 tablet 11  . SYNTHROID 75 MCG tablet TAKE ONE TABLET BY MOUTH EVERY DAY BEFORE BREAKFAST (Patient taking differently: 1 tablet daily except for 1.5 tablet on Sunday.) 30 tablet 6  . traMADol (ULTRAM) 50 MG tablet TAKE ONE TABLET BY MOUTH DAILY AS NEEDED 30 tablet 0  . umeclidinium bromide (INCRUSE ELLIPTA) 62.5 MCG/INH AEPB Inhale 1 puff into the lungs daily. 90 each 1  . UNIFINE PENTIPS 29G X 12MM MISC USE ONCE DAILY AS DIRECTED 100 each 11  . VENTOLIN HFA 108 (90 Base) MCG/ACT inhaler INHALE 2 PUFFS INTO THE LUNGS EVERY 4 HOURS AS NEEDED FOR WHEEZING 18 g 3  . HYDROcodone-acetaminophen (NORCO/VICODIN) 5-325 MG tablet Take 1 tablet by mouth every 6 (six) hours as needed. 15 tablet 0  . triamcinolone ointment (KENALOG) 0.5 % Apply 1 application topically at bedtime. (Patient not taking: Reported on 01/27/2017) 30 g 0   No current facility-administered medications for this visit.    Allergies  Allergen Reactions  . Other Anaphylaxis  . Tetracyclines & Related Anaphylaxis  . Tetracycline     REACTION: arms swelling, itching, blisters     Exam:  BP 136/74   Pulse 75   Ht 5\' 4"  (1.626 m)   Wt 146 lb 4.8 oz (66.4 kg)   SpO2 96%   BMI 25.11 kg/m  General: Well Developed, well nourished, and in no acute distress.  Neuro/Psych: Alert and oriented x3, extra-ocular muscles intact, able to move all 4 extremities, sensation grossly intact. Skin: Warm and dry, no rashes noted.    Respiratory: Not using accessory muscles, speaking in full sentences, trachea midline.  Cardiovascular: Pulses palpable, no extremity edema. Abdomen: Does not appear distended. MSK:  Right foot significantly bruised. Diffusely tender. Pulses capillary refill and sensation are intact.  Left shoulder normal-appearing nontender motion is limited by pain. Positive Hawkins and Neer's test. Positive empty can test. Strength is otherwise intact but slightly weak to abduction and external rotation.  No results found for this or any previous visit (from the past 48 hour(s)). Dg Foot Complete Right  Result Date: 01/27/2017 CLINICAL DATA:  Status post fall on May 8th with persistent pain over the tarsals and proximal metatarsals associated with swelling and bruising throughout the foot. EXAM: RIGHT FOOT COMPLETE - 3+ VIEW COMPARISON:  Right foot series of Jan 21, 2017 FINDINGS: The bones are subjectively osteopenic. No acute displaced or nondisplaced fracture is observed. There is no lytic or blastic lesion or periosteal reaction. There is mild narrowing of the interphalangeal joints and of the first MTP joint space. Very mild hallux valgus contour of the first ray is noted but is less conspicuous than on the previous study. IMPRESSION: There is no acute or healing fracture of the right foot. If the patient's symptoms persist, MRI would be a useful next imaging step. Electronically Signed   By: David  Martinique M.D.   On: 01/27/2017 10:05      Assessment and Plan: 82 y.o. female with  Right foot pain likely contusion or strain. Doubtful for Lisfranc injury based on x-ray appearance. Continue watchful waiting with immobilization. I think a cam walker boot would be better but patient declines this intervention. Plan for home health physical therapy  Left shoulder pain: Rotator cuff strain. Patient has intact strength and motion. Plan for home physical therapy and recheck soon as needed.    Orders  Placed This Encounter  Procedures  . DG Foot Complete Right    Standing Status:   Future    Number of Occurrences:   1    Standing Expiration Date:   03/29/2018    Order Specific Question:   Reason for Exam (SYMPTOM  OR DIAGNOSIS REQUIRED)    Answer:   eval pain midfoot. Including additionally weightbearing AP view    Order Specific Question:   Preferred imaging location?    Answer:   [facility]    Order Specific Question:   Radiology Contrast Protocol - do NOT remove file path    Answer:   [email or web address]    Discussed warning signs or symptoms. Please see discharge instructions. Patient expresses understanding.

## 2017-01-27 NOTE — Patient Instructions (Signed)
Thank you for coming in today. If pain is not controlled use the walking boot.  Attend Home Health PT.  Use norco for severe pain mostly at night.  Recheck in 2 weeks.  Return sooner if needed.   Foot Sprain A foot sprain is an injury to one of the strong bands of tissue (ligaments) that connect and support the many bones in your feet. The ligament can be stretched too much or it can tear. A tear can be either partial or complete. The severity of the sprain depends on how much of the ligament was damaged or torn. What are the causes? A foot sprain is usually caused by suddenly twisting or pivoting your foot. What increases the risk? This injury is more likely to occur in people who:  Play a sport, such as basketball or football.  Exercise or play a sport without warming up.  Start a new workout or sport.  Suddenly increase how long or hard they exercise or play a sport. What are the signs or symptoms? Symptoms of this condition start soon after an injury and include:  Pain, especially in the arch of the foot.  Bruising.  Swelling.  Inability to walk or use the foot to support body weight. How is this diagnosed? This condition is diagnosed with a medical history and physical exam. You may also have imaging tests, such as:  X-rays to make sure there are no broken bones (fractures).  MRI to see if the ligament has torn. How is this treated? Treatment varies depending on the severity of your sprain. Mild sprains can be treated with rest, ice, compression, and elevation (RICE). If your ligament is overstretched or partially torn, treatment usually involves keeping your foot in a fixed position (immobilization) for a period of time. To help you do this, your health care provider will apply a bandage, splint, or walking boot to keep your foot from moving until it heals. You may also be advised to use crutches or a scooter for a few weeks to avoid bearing weight on your foot while it is  healing. If your ligament is fully torn, you may need surgery to reconnect the ligament to the bone. After surgery, a cast or splint will be applied and will need to stay on your foot while it heals. Your health care provider may also suggest exercises or physical therapy to strengthen your foot. Follow these instructions at home: If You Have a Bandage, Splint, or Walking Boot:   Wear it as directed by your health care provider. Remove it only as directed by your health care provider.  Loosen the bandage, splint, or walking boot if your toes become numb and tingle, or if they turn cold and blue. Bathing   If your health care provider approves bathing and showering, cover the bandage or splint with a watertight plastic bag to protect it from water. Do not let the bandage or splint get wet. Managing pain, stiffness, and swelling   If directed, apply ice to the injured area:  Put ice in a plastic bag.  Place a towel between your skin and the bag.  Leave the ice on for 20 minutes, 2-3 times per day.  Move your toes often to avoid stiffness and to lessen swelling.  Raise (elevate) the injured area above the level of your heart while you are sitting or lying down. Driving   Do not drive or operate heavy machinery while taking pain medicine.  Ask your health care provider when  it is safe to drive if you have a bandage, splint, or walking boot on your foot. Activity   Rest as directed by your health care provider.  Do not use the injured foot to support your body weight until your health care provider says that you can. Use crutches or other supportive devices as directed by your health care provider.  Ask your health care provider what activities are safe for you. Gradually increase how much and how far you walk until your health care provider says it is safe to return to full activity.  Do any exercise or physical therapy as directed by your health care provider. General instructions    If a splint was applied, do not put pressure on any part of it until it is fully hardened. This may take several hours.  Take medicines only as directed by your health care provider. These include over-the-counter medicines and prescription medicines.  Keep all follow-up visits as directed by your health care provider. This is important.  When you can walk without pain, wear supportive shoes that have stiff soles. Do not wear flip-flops, and do not walk barefoot. Contact a health care provider if:  Your pain is not controlled with medicine.  Your bruising or swelling gets worse or does not get better with treatment.  Your splint or walking boot is damaged. Get help right away if:  You develop severe numbness or tingling in your foot.  Your foot turns blue, white, or gray, and it feels cold. This information is not intended to replace advice given to you by your health care provider. Make sure you discuss any questions you have with your health care provider. Document Released: 02/22/2002 Document Revised: 02/08/2016 Document Reviewed: 07/06/2014 Elsevier Interactive Patient Education  2017 [employer].

## 2017-02-03 DIAGNOSIS — M25512 Pain in left shoulder: Secondary | ICD-10-CM | POA: Diagnosis not present

## 2017-02-03 DIAGNOSIS — G8929 Other chronic pain: Secondary | ICD-10-CM | POA: Diagnosis not present

## 2017-02-03 DIAGNOSIS — M549 Dorsalgia, unspecified: Secondary | ICD-10-CM | POA: Diagnosis not present

## 2017-02-03 DIAGNOSIS — S93401D Sprain of unspecified ligament of right ankle, subsequent encounter: Secondary | ICD-10-CM | POA: Diagnosis not present

## 2017-02-03 DIAGNOSIS — E113299 Type 2 diabetes mellitus with mild nonproliferative diabetic retinopathy without macular edema, unspecified eye: Secondary | ICD-10-CM | POA: Diagnosis not present

## 2017-02-03 DIAGNOSIS — J449 Chronic obstructive pulmonary disease, unspecified: Secondary | ICD-10-CM | POA: Diagnosis not present

## 2017-02-03 DIAGNOSIS — N183 Chronic kidney disease, stage 3 (moderate): Secondary | ICD-10-CM | POA: Diagnosis not present

## 2017-02-03 DIAGNOSIS — R2681 Unsteadiness on feet: Secondary | ICD-10-CM | POA: Diagnosis not present

## 2017-02-03 DIAGNOSIS — E1122 Type 2 diabetes mellitus with diabetic chronic kidney disease: Secondary | ICD-10-CM | POA: Diagnosis not present

## 2017-02-05 DIAGNOSIS — J449 Chronic obstructive pulmonary disease, unspecified: Secondary | ICD-10-CM | POA: Diagnosis not present

## 2017-02-05 DIAGNOSIS — S93401D Sprain of unspecified ligament of right ankle, subsequent encounter: Secondary | ICD-10-CM | POA: Diagnosis not present

## 2017-02-05 DIAGNOSIS — M25512 Pain in left shoulder: Secondary | ICD-10-CM | POA: Diagnosis not present

## 2017-02-05 DIAGNOSIS — J45909 Unspecified asthma, uncomplicated: Secondary | ICD-10-CM | POA: Diagnosis not present

## 2017-02-05 DIAGNOSIS — E113299 Type 2 diabetes mellitus with mild nonproliferative diabetic retinopathy without macular edema, unspecified eye: Secondary | ICD-10-CM | POA: Diagnosis not present

## 2017-02-05 DIAGNOSIS — R2681 Unsteadiness on feet: Secondary | ICD-10-CM | POA: Diagnosis not present

## 2017-02-05 DIAGNOSIS — Q348 Other specified congenital malformations of respiratory system: Secondary | ICD-10-CM | POA: Diagnosis not present

## 2017-02-05 DIAGNOSIS — E1122 Type 2 diabetes mellitus with diabetic chronic kidney disease: Secondary | ICD-10-CM | POA: Diagnosis not present

## 2017-02-05 DIAGNOSIS — M549 Dorsalgia, unspecified: Secondary | ICD-10-CM | POA: Diagnosis not present

## 2017-02-05 DIAGNOSIS — G8929 Other chronic pain: Secondary | ICD-10-CM | POA: Diagnosis not present

## 2017-02-05 DIAGNOSIS — N183 Chronic kidney disease, stage 3 (moderate): Secondary | ICD-10-CM | POA: Diagnosis not present

## 2017-02-10 DIAGNOSIS — N183 Chronic kidney disease, stage 3 (moderate): Secondary | ICD-10-CM | POA: Diagnosis not present

## 2017-02-10 DIAGNOSIS — M549 Dorsalgia, unspecified: Secondary | ICD-10-CM | POA: Diagnosis not present

## 2017-02-10 DIAGNOSIS — J449 Chronic obstructive pulmonary disease, unspecified: Secondary | ICD-10-CM | POA: Diagnosis not present

## 2017-02-10 DIAGNOSIS — E1122 Type 2 diabetes mellitus with diabetic chronic kidney disease: Secondary | ICD-10-CM | POA: Diagnosis not present

## 2017-02-10 DIAGNOSIS — R2681 Unsteadiness on feet: Secondary | ICD-10-CM | POA: Diagnosis not present

## 2017-02-10 DIAGNOSIS — M25512 Pain in left shoulder: Secondary | ICD-10-CM | POA: Diagnosis not present

## 2017-02-10 DIAGNOSIS — S93401D Sprain of unspecified ligament of right ankle, subsequent encounter: Secondary | ICD-10-CM | POA: Diagnosis not present

## 2017-02-10 DIAGNOSIS — E113299 Type 2 diabetes mellitus with mild nonproliferative diabetic retinopathy without macular edema, unspecified eye: Secondary | ICD-10-CM | POA: Diagnosis not present

## 2017-02-10 DIAGNOSIS — G8929 Other chronic pain: Secondary | ICD-10-CM | POA: Diagnosis not present

## 2017-02-12 DIAGNOSIS — M549 Dorsalgia, unspecified: Secondary | ICD-10-CM | POA: Diagnosis not present

## 2017-02-12 DIAGNOSIS — N183 Chronic kidney disease, stage 3 (moderate): Secondary | ICD-10-CM | POA: Diagnosis not present

## 2017-02-12 DIAGNOSIS — J449 Chronic obstructive pulmonary disease, unspecified: Secondary | ICD-10-CM | POA: Diagnosis not present

## 2017-02-12 DIAGNOSIS — G8929 Other chronic pain: Secondary | ICD-10-CM | POA: Diagnosis not present

## 2017-02-12 DIAGNOSIS — S93401D Sprain of unspecified ligament of right ankle, subsequent encounter: Secondary | ICD-10-CM | POA: Diagnosis not present

## 2017-02-12 DIAGNOSIS — M25512 Pain in left shoulder: Secondary | ICD-10-CM | POA: Diagnosis not present

## 2017-02-12 DIAGNOSIS — E1122 Type 2 diabetes mellitus with diabetic chronic kidney disease: Secondary | ICD-10-CM | POA: Diagnosis not present

## 2017-02-12 DIAGNOSIS — E113299 Type 2 diabetes mellitus with mild nonproliferative diabetic retinopathy without macular edema, unspecified eye: Secondary | ICD-10-CM | POA: Diagnosis not present

## 2017-02-12 DIAGNOSIS — R2681 Unsteadiness on feet: Secondary | ICD-10-CM | POA: Diagnosis not present

## 2017-02-13 DIAGNOSIS — J449 Chronic obstructive pulmonary disease, unspecified: Secondary | ICD-10-CM | POA: Diagnosis not present

## 2017-02-13 DIAGNOSIS — M549 Dorsalgia, unspecified: Secondary | ICD-10-CM | POA: Diagnosis not present

## 2017-02-13 DIAGNOSIS — G8929 Other chronic pain: Secondary | ICD-10-CM | POA: Diagnosis not present

## 2017-02-13 DIAGNOSIS — M25512 Pain in left shoulder: Secondary | ICD-10-CM | POA: Diagnosis not present

## 2017-02-13 DIAGNOSIS — N183 Chronic kidney disease, stage 3 (moderate): Secondary | ICD-10-CM | POA: Diagnosis not present

## 2017-02-13 DIAGNOSIS — S93401D Sprain of unspecified ligament of right ankle, subsequent encounter: Secondary | ICD-10-CM | POA: Diagnosis not present

## 2017-02-13 DIAGNOSIS — E1122 Type 2 diabetes mellitus with diabetic chronic kidney disease: Secondary | ICD-10-CM | POA: Diagnosis not present

## 2017-02-13 DIAGNOSIS — R2681 Unsteadiness on feet: Secondary | ICD-10-CM | POA: Diagnosis not present

## 2017-02-13 DIAGNOSIS — E113299 Type 2 diabetes mellitus with mild nonproliferative diabetic retinopathy without macular edema, unspecified eye: Secondary | ICD-10-CM | POA: Diagnosis not present

## 2017-02-17 ENCOUNTER — Ambulatory Visit (INDEPENDENT_AMBULATORY_CARE_PROVIDER_SITE_OTHER): Payer: Medicare HMO

## 2017-02-17 ENCOUNTER — Encounter: Payer: Self-pay | Admitting: Family Medicine

## 2017-02-17 ENCOUNTER — Ambulatory Visit (INDEPENDENT_AMBULATORY_CARE_PROVIDER_SITE_OTHER): Payer: Medicare HMO | Admitting: Family Medicine

## 2017-02-17 VITALS — BP 151/101 | HR 87 | Wt 147.0 lb

## 2017-02-17 DIAGNOSIS — S92511A Displaced fracture of proximal phalanx of right lesser toe(s), initial encounter for closed fracture: Secondary | ICD-10-CM

## 2017-02-17 DIAGNOSIS — M79671 Pain in right foot: Secondary | ICD-10-CM | POA: Diagnosis not present

## 2017-02-17 DIAGNOSIS — B353 Tinea pedis: Secondary | ICD-10-CM | POA: Diagnosis not present

## 2017-02-17 DIAGNOSIS — B029 Zoster without complications: Secondary | ICD-10-CM | POA: Diagnosis not present

## 2017-02-17 DIAGNOSIS — M25512 Pain in left shoulder: Secondary | ICD-10-CM | POA: Diagnosis not present

## 2017-02-17 DIAGNOSIS — W19XXXA Unspecified fall, initial encounter: Secondary | ICD-10-CM

## 2017-02-17 DIAGNOSIS — E1122 Type 2 diabetes mellitus with diabetic chronic kidney disease: Secondary | ICD-10-CM | POA: Diagnosis not present

## 2017-02-17 DIAGNOSIS — R2681 Unsteadiness on feet: Secondary | ICD-10-CM | POA: Diagnosis not present

## 2017-02-17 DIAGNOSIS — S93401D Sprain of unspecified ligament of right ankle, subsequent encounter: Secondary | ICD-10-CM | POA: Diagnosis not present

## 2017-02-17 MED ORDER — TERBINAFINE HCL 1 % EX CREA: TOPICAL_CREAM | CUTANEOUS | 3 refills | Status: DC

## 2017-02-17 NOTE — Patient Instructions (Signed)
Thank you for coming in today. Try to transition back into a regular shoe.  Let me know if this does not work out.   You do have shingles.  Use a barrier cream on the rash.   Recheck as needed.

## 2017-02-17 NOTE — Progress Notes (Signed)
Theresa Huang is a 83 y.o. female who presents to [facility] [place] today for follow-up right foot pain. Patient was seen May 14 after injuring her right foot. She was originally treated in a cam walker boot which she did not tolerate. She was switched to a postoperative shoe. In the interim she noted significant improvement in pain but continues to experience lateral foot pain. She also notes cracking and peeling of the skin on her foot. She denies any fevers or chills nausea vomiting or diarrhea and feels well otherwise.  She notes several skin issues as well. She notes dry cracked skin on the right foot is somewhat itchy. She suspects this may be athlete's foot. She has not tried treatment yet.  Additionally she notes a itchy tingling rash on her sacrum consistent with previous episodes of shingles. This is been present for about a week and she has not tried any treatment for it yet.   Past Medical History:  Diagnosis Date  . Anemia   . Cancer ([place])    SCC  . Carotid artery occlusion   . COPD (chronic obstructive pulmonary disease) (HCC)    wears oxygen at 3L prn daytime and 3L at night   . DDD (degenerative disc disease)   . Diabetes mellitus (HCC)    fasting blood sugar 80-100  . Diastolic dysfunction   . Dizziness   . Glaucoma   . Headache(784.0)    migranes  . Hearing loss of both ears    wears bilateral hearing aids  . Hemorrhoids   . Holter monitor, abnormal    wearing for 21 days  . Hyperlipidemia   . Hypothyroid   . Inflammatory polyps of colon with rectal bleeding ([place])   . Irregular heart beat   . Myocardial infarction ([place]) 2006  . Pneumonia    hx  . Retinopathy of left eye   . Shortness of breath    most of time  . Syncope    Past Surgical History:  Procedure Laterality Date  . ABDOMINAL HYSTERECTOMY    . CAROTID ENDARTERECTOMY    .  COLONOSCOPY W/ POLYPECTOMY    . ENDARTERECTOMY  09/01/2012   Procedure: ENDARTERECTOMY CAROTID;  Surgeon: Conrad North Newton, MD;  Location: [facility];  Service: Vascular;  Laterality: Right;  . EYE SURGERY     cataracts  . HEMORRHOID SURGERY    . PATCH ANGIOPLASTY  09/01/2012   Procedure: PATCH ANGIOPLASTY;  Surgeon: Conrad Rebecca, MD;  Location: [place];  Service: Vascular;  Laterality: Right;  Vascu-Guard Patch Angioplasty  . squamous cell removed    . THROAT SURGERY    . TONSILLECTOMY     Social History  Substance Use Topics  . Smoking status: Former Smoker    Types: Cigarettes    Quit date: 09/17/1987  . Smokeless tobacco: Never Used  . Alcohol use No   family history includes Breast cancer in her daughter; CAD in her father and mother; Cancer in her daughter; Depression in her mother; Diabetes in her brother and mother; Heart disease in her father and mother; Hyperlipidemia in her daughter; Hypertension in her son.  ROS as above:  Medications: Current Outpatient Prescriptions  Medication Sig Dispense Refill  . acetaminophen (TYLENOL ARTHRITIS PAIN) 650 MG CR tablet Take 650 mg by mouth every 8 (eight) hours as needed. For pain    . alendronate (FOSAMAX) 70 MG tablet TAKE ONE TABLET ONCE A WEEK  4 tablet 12  . AMBULATORY NON FORMULARY MEDICATION Medication Name: Home continuous oxygen.  3 liters. Please provide portable prn. Dx: COPD 1 Device prn  . AMBULATORY NON FORMULARY MEDICATION Medication Name: glucometer strips to test BID.  Dx. 250.00 100 Units PRN  . artificial tears (LACRILUBE) OINT ophthalmic ointment Place into both eyes every 3 (three) hours as needed for dry eyes. 3.5 g 1  . aspirin 81 MG tablet Take 81 mg by mouth daily.      . Calcium-Vitamin D (CALTRATE 600 PLUS-VIT D PO) Take 1 tablet by mouth daily.     . fluticasone furoate-vilanterol (BREO ELLIPTA) 200-25 MCG/INH AEPB Inhale 1 puff into the lungs daily. 180 each 3  . glipiZIDE (GLIPIZIDE XL) 2.5 MG 24 hr tablet Take 1  tablet (2.5 mg total) by mouth daily. 90 tablet 1  . glucose blood (TRUETRACK TEST) test strip TESTING TWICE DAILY. DX: E11.9 100 each PRN  . HYDROcodone-acetaminophen (NORCO/VICODIN) 5-325 MG tablet Take 1 tablet by mouth every 6 (six) hours as needed. 15 tablet 0  . LANTUS SOLOSTAR 100 UNIT/ML Solostar Pen INJECT 8 UNITS INTO THE SKIN AT BEDTIME 5 pen 6  . loratadine (CLARITIN) 10 MG tablet TAKE 1 TABLET BY MOUTH ONCE DAILY 30 tablet 12  . Multiple Vitamin (MULTIVITAMIN) tablet Take 1 tablet by mouth daily.      . naproxen (NAPROSYN) 500 MG tablet TAKE ONE TABLET BY MOUTH 2 TIMES A DAY WITH A MEAL AS NEEDED FOR JAW AND EAR PAIN 60 tablet 3  . nitroGLYCERIN (NITROSTAT) 0.4 MG SL tablet Place 1 tablet (0.4 mg total) under the tongue every 5 (five) minutes as needed. For chest pain 30 tablet 0  . olopatadine (PATANOL) 0.1 % ophthalmic solution Place 1 drop into both eyes 2 (two) times daily. 5 mL 1  . QC LANCETS SUPER THIN MISC use for testing TWICE DAILY. DX: E11.9 100 each 11  . sertraline (ZOLOFT) 100 MG tablet TAKE 1 & 1/2 TABLETS BY MOUTH EVERY DAY 45 tablet 6  . simvastatin (ZOCOR) 20 MG tablet Take 1 tablet (20 mg total) by mouth daily at 6 PM. 30 tablet 11  . SYNTHROID 75 MCG tablet TAKE ONE TABLET BY MOUTH EVERY DAY BEFORE BREAKFAST (Patient taking differently: 1 tablet daily except for 1.5 tablet on Sunday.) 30 tablet 6  . traMADol (ULTRAM) 50 MG tablet TAKE ONE TABLET BY MOUTH DAILY AS NEEDED 30 tablet 0  . triamcinolone ointment (KENALOG) 0.5 % Apply 1 application topically at bedtime. (Patient not taking: Reported on 01/27/2017) 30 g 0  . umeclidinium bromide (INCRUSE ELLIPTA) 62.5 MCG/INH AEPB Inhale 1 puff into the lungs daily. 90 each 1  . UNIFINE PENTIPS 29G X 12MM MISC USE ONCE DAILY AS DIRECTED 100 each 11  . VENTOLIN HFA 108 (90 Base) MCG/ACT inhaler INHALE 2 PUFFS INTO THE LUNGS EVERY 4 HOURS AS NEEDED FOR WHEEZING 18 g 3   No current facility-administered medications for this  visit.    Allergies  Allergen Reactions  . Other Anaphylaxis  . Tetracyclines & Related Anaphylaxis  . Tetracycline     REACTION: arms swelling, itching, blisters    Health Maintenance Health Maintenance  Topic Date Due  . INFLUENZA VACCINE  04/16/2017  . OPHTHALMOLOGY EXAM  07/17/2017  . HEMOGLOBIN A1C  07/23/2017  . URINE MICROALBUMIN  12/10/2017  . FOOT EXAM  01/20/2018  . TETANUS/TDAP  12/04/2025  . DEXA SCAN  Completed  . PNA vac Low Risk Adult  Completed     Exam:  BP (!) 151/101   Pulse 87   Wt 147 lb (66.7 kg)   BMI 25.23 kg/m  Gen: Well NAD HEENT: EOMI,  MMM Lungs: Normal work of breathing. CTABL Heart: RRR no MRG Abd: NABS, Soft. Nondistended, Nontender Exts: Brisk capillary refill, warm and well perfused.  MSK: Right foot lateral foot is erythematous and mildly tender along the course of the fifth metatarsal Pulses capillary refill and sensation are intact.  Skin: Cracked peeling skin on the dorsum and plantar aspect of the foot consistent with athlete's foot.  On the sacrum she has an erythematous rash with several crusted papules consistent with shingles.  X-ray right foot shows no obvious fractures. Awaiting formal radiology review     Assessment and Plan: 83 y.o. female with  Right foot pain: Resolving contusion versus sprain. Transition to regular she will continue Gilford Rile return as needed.  Tinea pedis: Recommend terbinafine cream twice daily for one month.  Shingles: Patient is outside of treatment window for oral valacyclovir at this point. Recommend barrier cream for the sacrum area and symptomatic management as needed. Avoid people who are vulnerable to chickenpox infections.   Orders Placed This Encounter  Procedures  . DG Foot Complete Right    Standing Status:   Future    Number of Occurrences:   1    Standing Expiration Date:   04/19/2018    Order Specific Question:   Reason for Exam (SYMPTOM  OR DIAGNOSIS REQUIRED)    Answer:    eval lateral foot pain    Order Specific Question:   Preferred imaging location?    Answer:   [facility]    Order Specific Question:   Radiology Contrast Protocol - do NOT remove file path    Answer:   [email or web address]   No orders of the defined types were placed in this encounter.    Discussed warning signs or symptoms. Please see discharge instructions. Patient expresses understanding.

## 2017-02-19 DIAGNOSIS — E1122 Type 2 diabetes mellitus with diabetic chronic kidney disease: Secondary | ICD-10-CM | POA: Diagnosis not present

## 2017-02-19 DIAGNOSIS — G8929 Other chronic pain: Secondary | ICD-10-CM | POA: Diagnosis not present

## 2017-02-19 DIAGNOSIS — S93401D Sprain of unspecified ligament of right ankle, subsequent encounter: Secondary | ICD-10-CM | POA: Diagnosis not present

## 2017-02-19 DIAGNOSIS — M549 Dorsalgia, unspecified: Secondary | ICD-10-CM | POA: Diagnosis not present

## 2017-02-19 DIAGNOSIS — R2681 Unsteadiness on feet: Secondary | ICD-10-CM | POA: Diagnosis not present

## 2017-02-19 DIAGNOSIS — J449 Chronic obstructive pulmonary disease, unspecified: Secondary | ICD-10-CM | POA: Diagnosis not present

## 2017-02-19 DIAGNOSIS — N183 Chronic kidney disease, stage 3 (moderate): Secondary | ICD-10-CM | POA: Diagnosis not present

## 2017-02-19 DIAGNOSIS — E113299 Type 2 diabetes mellitus with mild nonproliferative diabetic retinopathy without macular edema, unspecified eye: Secondary | ICD-10-CM | POA: Diagnosis not present

## 2017-02-19 DIAGNOSIS — M25512 Pain in left shoulder: Secondary | ICD-10-CM | POA: Diagnosis not present

## 2017-02-20 DIAGNOSIS — J449 Chronic obstructive pulmonary disease, unspecified: Secondary | ICD-10-CM | POA: Diagnosis not present

## 2017-02-20 DIAGNOSIS — N3941 Urge incontinence: Secondary | ICD-10-CM | POA: Diagnosis not present

## 2017-02-20 DIAGNOSIS — E119 Type 2 diabetes mellitus without complications: Secondary | ICD-10-CM | POA: Diagnosis not present

## 2017-02-21 DIAGNOSIS — N183 Chronic kidney disease, stage 3 (moderate): Secondary | ICD-10-CM | POA: Diagnosis not present

## 2017-02-21 DIAGNOSIS — M25512 Pain in left shoulder: Secondary | ICD-10-CM | POA: Diagnosis not present

## 2017-02-21 DIAGNOSIS — E113299 Type 2 diabetes mellitus with mild nonproliferative diabetic retinopathy without macular edema, unspecified eye: Secondary | ICD-10-CM | POA: Diagnosis not present

## 2017-02-21 DIAGNOSIS — S93401D Sprain of unspecified ligament of right ankle, subsequent encounter: Secondary | ICD-10-CM | POA: Diagnosis not present

## 2017-02-21 DIAGNOSIS — G8929 Other chronic pain: Secondary | ICD-10-CM | POA: Diagnosis not present

## 2017-02-21 DIAGNOSIS — E1122 Type 2 diabetes mellitus with diabetic chronic kidney disease: Secondary | ICD-10-CM | POA: Diagnosis not present

## 2017-02-21 DIAGNOSIS — R2681 Unsteadiness on feet: Secondary | ICD-10-CM | POA: Diagnosis not present

## 2017-02-21 DIAGNOSIS — M549 Dorsalgia, unspecified: Secondary | ICD-10-CM | POA: Diagnosis not present

## 2017-02-21 DIAGNOSIS — J449 Chronic obstructive pulmonary disease, unspecified: Secondary | ICD-10-CM | POA: Diagnosis not present

## 2017-02-24 DIAGNOSIS — E113299 Type 2 diabetes mellitus with mild nonproliferative diabetic retinopathy without macular edema, unspecified eye: Secondary | ICD-10-CM | POA: Diagnosis not present

## 2017-02-24 DIAGNOSIS — M549 Dorsalgia, unspecified: Secondary | ICD-10-CM | POA: Diagnosis not present

## 2017-02-24 DIAGNOSIS — G8929 Other chronic pain: Secondary | ICD-10-CM | POA: Diagnosis not present

## 2017-02-24 DIAGNOSIS — R2681 Unsteadiness on feet: Secondary | ICD-10-CM | POA: Diagnosis not present

## 2017-02-24 DIAGNOSIS — J449 Chronic obstructive pulmonary disease, unspecified: Secondary | ICD-10-CM | POA: Diagnosis not present

## 2017-02-24 DIAGNOSIS — S93401D Sprain of unspecified ligament of right ankle, subsequent encounter: Secondary | ICD-10-CM | POA: Diagnosis not present

## 2017-02-24 DIAGNOSIS — E1122 Type 2 diabetes mellitus with diabetic chronic kidney disease: Secondary | ICD-10-CM | POA: Diagnosis not present

## 2017-02-24 DIAGNOSIS — N183 Chronic kidney disease, stage 3 (moderate): Secondary | ICD-10-CM | POA: Diagnosis not present

## 2017-02-24 DIAGNOSIS — M25512 Pain in left shoulder: Secondary | ICD-10-CM | POA: Diagnosis not present

## 2017-02-25 DIAGNOSIS — G8929 Other chronic pain: Secondary | ICD-10-CM | POA: Diagnosis not present

## 2017-02-25 DIAGNOSIS — M549 Dorsalgia, unspecified: Secondary | ICD-10-CM | POA: Diagnosis not present

## 2017-02-25 DIAGNOSIS — E1122 Type 2 diabetes mellitus with diabetic chronic kidney disease: Secondary | ICD-10-CM | POA: Diagnosis not present

## 2017-02-25 DIAGNOSIS — R2681 Unsteadiness on feet: Secondary | ICD-10-CM | POA: Diagnosis not present

## 2017-02-25 DIAGNOSIS — S93401D Sprain of unspecified ligament of right ankle, subsequent encounter: Secondary | ICD-10-CM | POA: Diagnosis not present

## 2017-02-25 DIAGNOSIS — E113299 Type 2 diabetes mellitus with mild nonproliferative diabetic retinopathy without macular edema, unspecified eye: Secondary | ICD-10-CM | POA: Diagnosis not present

## 2017-02-25 DIAGNOSIS — N183 Chronic kidney disease, stage 3 (moderate): Secondary | ICD-10-CM | POA: Diagnosis not present

## 2017-02-25 DIAGNOSIS — M25512 Pain in left shoulder: Secondary | ICD-10-CM | POA: Diagnosis not present

## 2017-02-25 DIAGNOSIS — J449 Chronic obstructive pulmonary disease, unspecified: Secondary | ICD-10-CM | POA: Diagnosis not present

## 2017-02-26 DIAGNOSIS — R2681 Unsteadiness on feet: Secondary | ICD-10-CM | POA: Diagnosis not present

## 2017-02-26 DIAGNOSIS — G8929 Other chronic pain: Secondary | ICD-10-CM | POA: Diagnosis not present

## 2017-02-26 DIAGNOSIS — E1122 Type 2 diabetes mellitus with diabetic chronic kidney disease: Secondary | ICD-10-CM | POA: Diagnosis not present

## 2017-02-26 DIAGNOSIS — E113299 Type 2 diabetes mellitus with mild nonproliferative diabetic retinopathy without macular edema, unspecified eye: Secondary | ICD-10-CM | POA: Diagnosis not present

## 2017-02-26 DIAGNOSIS — M25512 Pain in left shoulder: Secondary | ICD-10-CM | POA: Diagnosis not present

## 2017-02-26 DIAGNOSIS — S93401D Sprain of unspecified ligament of right ankle, subsequent encounter: Secondary | ICD-10-CM | POA: Diagnosis not present

## 2017-02-26 DIAGNOSIS — N183 Chronic kidney disease, stage 3 (moderate): Secondary | ICD-10-CM | POA: Diagnosis not present

## 2017-02-26 DIAGNOSIS — M549 Dorsalgia, unspecified: Secondary | ICD-10-CM | POA: Diagnosis not present

## 2017-02-26 DIAGNOSIS — J449 Chronic obstructive pulmonary disease, unspecified: Secondary | ICD-10-CM | POA: Diagnosis not present

## 2017-03-03 DIAGNOSIS — N183 Chronic kidney disease, stage 3 (moderate): Secondary | ICD-10-CM | POA: Diagnosis not present

## 2017-03-03 DIAGNOSIS — M25512 Pain in left shoulder: Secondary | ICD-10-CM | POA: Diagnosis not present

## 2017-03-03 DIAGNOSIS — E1122 Type 2 diabetes mellitus with diabetic chronic kidney disease: Secondary | ICD-10-CM | POA: Diagnosis not present

## 2017-03-03 DIAGNOSIS — M549 Dorsalgia, unspecified: Secondary | ICD-10-CM | POA: Diagnosis not present

## 2017-03-03 DIAGNOSIS — E113299 Type 2 diabetes mellitus with mild nonproliferative diabetic retinopathy without macular edema, unspecified eye: Secondary | ICD-10-CM | POA: Diagnosis not present

## 2017-03-03 DIAGNOSIS — G8929 Other chronic pain: Secondary | ICD-10-CM | POA: Diagnosis not present

## 2017-03-03 DIAGNOSIS — R2681 Unsteadiness on feet: Secondary | ICD-10-CM | POA: Diagnosis not present

## 2017-03-03 DIAGNOSIS — J449 Chronic obstructive pulmonary disease, unspecified: Secondary | ICD-10-CM | POA: Diagnosis not present

## 2017-03-03 DIAGNOSIS — S93401D Sprain of unspecified ligament of right ankle, subsequent encounter: Secondary | ICD-10-CM | POA: Diagnosis not present

## 2017-03-05 DIAGNOSIS — E1122 Type 2 diabetes mellitus with diabetic chronic kidney disease: Secondary | ICD-10-CM | POA: Diagnosis not present

## 2017-03-05 DIAGNOSIS — R2681 Unsteadiness on feet: Secondary | ICD-10-CM | POA: Diagnosis not present

## 2017-03-05 DIAGNOSIS — G8929 Other chronic pain: Secondary | ICD-10-CM | POA: Diagnosis not present

## 2017-03-05 DIAGNOSIS — N183 Chronic kidney disease, stage 3 (moderate): Secondary | ICD-10-CM | POA: Diagnosis not present

## 2017-03-05 DIAGNOSIS — E113299 Type 2 diabetes mellitus with mild nonproliferative diabetic retinopathy without macular edema, unspecified eye: Secondary | ICD-10-CM | POA: Diagnosis not present

## 2017-03-05 DIAGNOSIS — M549 Dorsalgia, unspecified: Secondary | ICD-10-CM | POA: Diagnosis not present

## 2017-03-05 DIAGNOSIS — S93401D Sprain of unspecified ligament of right ankle, subsequent encounter: Secondary | ICD-10-CM | POA: Diagnosis not present

## 2017-03-05 DIAGNOSIS — M25512 Pain in left shoulder: Secondary | ICD-10-CM | POA: Diagnosis not present

## 2017-03-05 DIAGNOSIS — J449 Chronic obstructive pulmonary disease, unspecified: Secondary | ICD-10-CM | POA: Diagnosis not present

## 2017-03-07 DIAGNOSIS — S93401D Sprain of unspecified ligament of right ankle, subsequent encounter: Secondary | ICD-10-CM | POA: Diagnosis not present

## 2017-03-07 DIAGNOSIS — J449 Chronic obstructive pulmonary disease, unspecified: Secondary | ICD-10-CM | POA: Diagnosis not present

## 2017-03-07 DIAGNOSIS — G8929 Other chronic pain: Secondary | ICD-10-CM | POA: Diagnosis not present

## 2017-03-07 DIAGNOSIS — N183 Chronic kidney disease, stage 3 (moderate): Secondary | ICD-10-CM | POA: Diagnosis not present

## 2017-03-07 DIAGNOSIS — E113299 Type 2 diabetes mellitus with mild nonproliferative diabetic retinopathy without macular edema, unspecified eye: Secondary | ICD-10-CM | POA: Diagnosis not present

## 2017-03-07 DIAGNOSIS — R2681 Unsteadiness on feet: Secondary | ICD-10-CM | POA: Diagnosis not present

## 2017-03-07 DIAGNOSIS — E1122 Type 2 diabetes mellitus with diabetic chronic kidney disease: Secondary | ICD-10-CM | POA: Diagnosis not present

## 2017-03-07 DIAGNOSIS — M25512 Pain in left shoulder: Secondary | ICD-10-CM | POA: Diagnosis not present

## 2017-03-07 DIAGNOSIS — M549 Dorsalgia, unspecified: Secondary | ICD-10-CM | POA: Diagnosis not present

## 2017-03-08 DIAGNOSIS — Q348 Other specified congenital malformations of respiratory system: Secondary | ICD-10-CM | POA: Diagnosis not present

## 2017-03-08 DIAGNOSIS — J45909 Unspecified asthma, uncomplicated: Secondary | ICD-10-CM | POA: Diagnosis not present

## 2017-03-10 DIAGNOSIS — M549 Dorsalgia, unspecified: Secondary | ICD-10-CM | POA: Diagnosis not present

## 2017-03-10 DIAGNOSIS — J449 Chronic obstructive pulmonary disease, unspecified: Secondary | ICD-10-CM | POA: Diagnosis not present

## 2017-03-10 DIAGNOSIS — S93401D Sprain of unspecified ligament of right ankle, subsequent encounter: Secondary | ICD-10-CM | POA: Diagnosis not present

## 2017-03-10 DIAGNOSIS — E1122 Type 2 diabetes mellitus with diabetic chronic kidney disease: Secondary | ICD-10-CM | POA: Diagnosis not present

## 2017-03-10 DIAGNOSIS — N183 Chronic kidney disease, stage 3 (moderate): Secondary | ICD-10-CM | POA: Diagnosis not present

## 2017-03-10 DIAGNOSIS — G8929 Other chronic pain: Secondary | ICD-10-CM | POA: Diagnosis not present

## 2017-03-10 DIAGNOSIS — E113299 Type 2 diabetes mellitus with mild nonproliferative diabetic retinopathy without macular edema, unspecified eye: Secondary | ICD-10-CM | POA: Diagnosis not present

## 2017-03-10 DIAGNOSIS — R2681 Unsteadiness on feet: Secondary | ICD-10-CM | POA: Diagnosis not present

## 2017-03-10 DIAGNOSIS — M25512 Pain in left shoulder: Secondary | ICD-10-CM | POA: Diagnosis not present

## 2017-03-11 DIAGNOSIS — S93401D Sprain of unspecified ligament of right ankle, subsequent encounter: Secondary | ICD-10-CM | POA: Diagnosis not present

## 2017-03-11 DIAGNOSIS — R2681 Unsteadiness on feet: Secondary | ICD-10-CM | POA: Diagnosis not present

## 2017-03-11 DIAGNOSIS — M549 Dorsalgia, unspecified: Secondary | ICD-10-CM | POA: Diagnosis not present

## 2017-03-11 DIAGNOSIS — M25512 Pain in left shoulder: Secondary | ICD-10-CM | POA: Diagnosis not present

## 2017-03-11 DIAGNOSIS — E1122 Type 2 diabetes mellitus with diabetic chronic kidney disease: Secondary | ICD-10-CM | POA: Diagnosis not present

## 2017-03-11 DIAGNOSIS — N183 Chronic kidney disease, stage 3 (moderate): Secondary | ICD-10-CM | POA: Diagnosis not present

## 2017-03-11 DIAGNOSIS — E113299 Type 2 diabetes mellitus with mild nonproliferative diabetic retinopathy without macular edema, unspecified eye: Secondary | ICD-10-CM | POA: Diagnosis not present

## 2017-03-11 DIAGNOSIS — J449 Chronic obstructive pulmonary disease, unspecified: Secondary | ICD-10-CM | POA: Diagnosis not present

## 2017-03-11 DIAGNOSIS — G8929 Other chronic pain: Secondary | ICD-10-CM | POA: Diagnosis not present

## 2017-03-14 DIAGNOSIS — M549 Dorsalgia, unspecified: Secondary | ICD-10-CM | POA: Diagnosis not present

## 2017-03-14 DIAGNOSIS — E1122 Type 2 diabetes mellitus with diabetic chronic kidney disease: Secondary | ICD-10-CM | POA: Diagnosis not present

## 2017-03-14 DIAGNOSIS — M25512 Pain in left shoulder: Secondary | ICD-10-CM | POA: Diagnosis not present

## 2017-03-14 DIAGNOSIS — S93401D Sprain of unspecified ligament of right ankle, subsequent encounter: Secondary | ICD-10-CM | POA: Diagnosis not present

## 2017-03-14 DIAGNOSIS — E113299 Type 2 diabetes mellitus with mild nonproliferative diabetic retinopathy without macular edema, unspecified eye: Secondary | ICD-10-CM | POA: Diagnosis not present

## 2017-03-14 DIAGNOSIS — N183 Chronic kidney disease, stage 3 (moderate): Secondary | ICD-10-CM | POA: Diagnosis not present

## 2017-03-14 DIAGNOSIS — G8929 Other chronic pain: Secondary | ICD-10-CM | POA: Diagnosis not present

## 2017-03-14 DIAGNOSIS — R2681 Unsteadiness on feet: Secondary | ICD-10-CM | POA: Diagnosis not present

## 2017-03-14 DIAGNOSIS — J449 Chronic obstructive pulmonary disease, unspecified: Secondary | ICD-10-CM | POA: Diagnosis not present

## 2017-03-17 DIAGNOSIS — M549 Dorsalgia, unspecified: Secondary | ICD-10-CM | POA: Diagnosis not present

## 2017-03-17 DIAGNOSIS — E1122 Type 2 diabetes mellitus with diabetic chronic kidney disease: Secondary | ICD-10-CM | POA: Diagnosis not present

## 2017-03-17 DIAGNOSIS — N183 Chronic kidney disease, stage 3 (moderate): Secondary | ICD-10-CM | POA: Diagnosis not present

## 2017-03-17 DIAGNOSIS — E113299 Type 2 diabetes mellitus with mild nonproliferative diabetic retinopathy without macular edema, unspecified eye: Secondary | ICD-10-CM | POA: Diagnosis not present

## 2017-03-17 DIAGNOSIS — J449 Chronic obstructive pulmonary disease, unspecified: Secondary | ICD-10-CM | POA: Diagnosis not present

## 2017-03-17 DIAGNOSIS — R2681 Unsteadiness on feet: Secondary | ICD-10-CM | POA: Diagnosis not present

## 2017-03-17 DIAGNOSIS — M25512 Pain in left shoulder: Secondary | ICD-10-CM | POA: Diagnosis not present

## 2017-03-17 DIAGNOSIS — G8929 Other chronic pain: Secondary | ICD-10-CM | POA: Diagnosis not present

## 2017-03-17 DIAGNOSIS — S93401D Sprain of unspecified ligament of right ankle, subsequent encounter: Secondary | ICD-10-CM | POA: Diagnosis not present

## 2017-03-20 DIAGNOSIS — N183 Chronic kidney disease, stage 3 (moderate): Secondary | ICD-10-CM | POA: Diagnosis not present

## 2017-03-20 DIAGNOSIS — S93401D Sprain of unspecified ligament of right ankle, subsequent encounter: Secondary | ICD-10-CM | POA: Diagnosis not present

## 2017-03-20 DIAGNOSIS — M25512 Pain in left shoulder: Secondary | ICD-10-CM | POA: Diagnosis not present

## 2017-03-20 DIAGNOSIS — J449 Chronic obstructive pulmonary disease, unspecified: Secondary | ICD-10-CM | POA: Diagnosis not present

## 2017-03-20 DIAGNOSIS — E113299 Type 2 diabetes mellitus with mild nonproliferative diabetic retinopathy without macular edema, unspecified eye: Secondary | ICD-10-CM | POA: Diagnosis not present

## 2017-03-20 DIAGNOSIS — R2681 Unsteadiness on feet: Secondary | ICD-10-CM | POA: Diagnosis not present

## 2017-03-20 DIAGNOSIS — G8929 Other chronic pain: Secondary | ICD-10-CM | POA: Diagnosis not present

## 2017-03-20 DIAGNOSIS — M549 Dorsalgia, unspecified: Secondary | ICD-10-CM | POA: Diagnosis not present

## 2017-03-20 DIAGNOSIS — E1122 Type 2 diabetes mellitus with diabetic chronic kidney disease: Secondary | ICD-10-CM | POA: Diagnosis not present

## 2017-03-24 DIAGNOSIS — N3941 Urge incontinence: Secondary | ICD-10-CM | POA: Diagnosis not present

## 2017-03-24 DIAGNOSIS — E119 Type 2 diabetes mellitus without complications: Secondary | ICD-10-CM | POA: Diagnosis not present

## 2017-03-24 DIAGNOSIS — J449 Chronic obstructive pulmonary disease, unspecified: Secondary | ICD-10-CM | POA: Diagnosis not present

## 2017-03-25 DIAGNOSIS — G8929 Other chronic pain: Secondary | ICD-10-CM | POA: Diagnosis not present

## 2017-03-25 DIAGNOSIS — S93401D Sprain of unspecified ligament of right ankle, subsequent encounter: Secondary | ICD-10-CM | POA: Diagnosis not present

## 2017-03-25 DIAGNOSIS — R2681 Unsteadiness on feet: Secondary | ICD-10-CM | POA: Diagnosis not present

## 2017-03-25 DIAGNOSIS — M25512 Pain in left shoulder: Secondary | ICD-10-CM | POA: Diagnosis not present

## 2017-03-25 DIAGNOSIS — E113299 Type 2 diabetes mellitus with mild nonproliferative diabetic retinopathy without macular edema, unspecified eye: Secondary | ICD-10-CM | POA: Diagnosis not present

## 2017-03-25 DIAGNOSIS — E1122 Type 2 diabetes mellitus with diabetic chronic kidney disease: Secondary | ICD-10-CM | POA: Diagnosis not present

## 2017-03-25 DIAGNOSIS — M549 Dorsalgia, unspecified: Secondary | ICD-10-CM | POA: Diagnosis not present

## 2017-03-25 DIAGNOSIS — N183 Chronic kidney disease, stage 3 (moderate): Secondary | ICD-10-CM | POA: Diagnosis not present

## 2017-03-25 DIAGNOSIS — J449 Chronic obstructive pulmonary disease, unspecified: Secondary | ICD-10-CM | POA: Diagnosis not present

## 2017-04-07 DIAGNOSIS — Q348 Other specified congenital malformations of respiratory system: Secondary | ICD-10-CM | POA: Diagnosis not present

## 2017-04-07 DIAGNOSIS — J45909 Unspecified asthma, uncomplicated: Secondary | ICD-10-CM | POA: Diagnosis not present

## 2017-04-22 ENCOUNTER — Other Ambulatory Visit: Payer: Self-pay | Admitting: Family Medicine

## 2017-04-24 DIAGNOSIS — E119 Type 2 diabetes mellitus without complications: Secondary | ICD-10-CM | POA: Diagnosis not present

## 2017-04-24 DIAGNOSIS — J449 Chronic obstructive pulmonary disease, unspecified: Secondary | ICD-10-CM | POA: Diagnosis not present

## 2017-04-24 DIAGNOSIS — N3941 Urge incontinence: Secondary | ICD-10-CM | POA: Diagnosis not present

## 2017-05-08 DIAGNOSIS — Q348 Other specified congenital malformations of respiratory system: Secondary | ICD-10-CM | POA: Diagnosis not present

## 2017-05-08 DIAGNOSIS — J45909 Unspecified asthma, uncomplicated: Secondary | ICD-10-CM | POA: Diagnosis not present

## 2017-05-26 DIAGNOSIS — E119 Type 2 diabetes mellitus without complications: Secondary | ICD-10-CM | POA: Diagnosis not present

## 2017-05-26 DIAGNOSIS — N3941 Urge incontinence: Secondary | ICD-10-CM | POA: Diagnosis not present

## 2017-05-26 DIAGNOSIS — J449 Chronic obstructive pulmonary disease, unspecified: Secondary | ICD-10-CM | POA: Diagnosis not present

## 2017-06-08 DIAGNOSIS — J45909 Unspecified asthma, uncomplicated: Secondary | ICD-10-CM | POA: Diagnosis not present

## 2017-06-08 DIAGNOSIS — Q348 Other specified congenital malformations of respiratory system: Secondary | ICD-10-CM | POA: Diagnosis not present

## 2017-06-18 ENCOUNTER — Other Ambulatory Visit: Payer: Self-pay | Admitting: Family Medicine

## 2017-06-25 DIAGNOSIS — N3941 Urge incontinence: Secondary | ICD-10-CM | POA: Diagnosis not present

## 2017-06-25 DIAGNOSIS — E119 Type 2 diabetes mellitus without complications: Secondary | ICD-10-CM | POA: Diagnosis not present

## 2017-07-08 DIAGNOSIS — Q348 Other specified congenital malformations of respiratory system: Secondary | ICD-10-CM | POA: Diagnosis not present

## 2017-07-08 DIAGNOSIS — J45909 Unspecified asthma, uncomplicated: Secondary | ICD-10-CM | POA: Diagnosis not present

## 2017-07-10 DIAGNOSIS — E1165 Type 2 diabetes mellitus with hyperglycemia: Secondary | ICD-10-CM | POA: Diagnosis not present

## 2017-07-10 DIAGNOSIS — I252 Old myocardial infarction: Secondary | ICD-10-CM | POA: Diagnosis not present

## 2017-07-10 DIAGNOSIS — E039 Hypothyroidism, unspecified: Secondary | ICD-10-CM | POA: Diagnosis not present

## 2017-07-10 DIAGNOSIS — J441 Chronic obstructive pulmonary disease with (acute) exacerbation: Secondary | ICD-10-CM | POA: Diagnosis not present

## 2017-07-10 DIAGNOSIS — Z794 Long term (current) use of insulin: Secondary | ICD-10-CM | POA: Diagnosis not present

## 2017-07-10 DIAGNOSIS — Z23 Encounter for immunization: Secondary | ICD-10-CM | POA: Diagnosis not present

## 2017-07-10 DIAGNOSIS — R070 Pain in throat: Secondary | ICD-10-CM | POA: Diagnosis not present

## 2017-07-10 DIAGNOSIS — R0603 Acute respiratory distress: Secondary | ICD-10-CM | POA: Insufficient documentation

## 2017-07-10 DIAGNOSIS — R918 Other nonspecific abnormal finding of lung field: Secondary | ICD-10-CM | POA: Diagnosis not present

## 2017-07-10 DIAGNOSIS — Z881 Allergy status to other antibiotic agents status: Secondary | ICD-10-CM | POA: Diagnosis not present

## 2017-07-10 DIAGNOSIS — J80 Acute respiratory distress syndrome: Secondary | ICD-10-CM | POA: Diagnosis not present

## 2017-07-10 DIAGNOSIS — R079 Chest pain, unspecified: Secondary | ICD-10-CM | POA: Diagnosis not present

## 2017-07-10 DIAGNOSIS — N183 Chronic kidney disease, stage 3 (moderate): Secondary | ICD-10-CM | POA: Diagnosis not present

## 2017-07-10 DIAGNOSIS — J9621 Acute and chronic respiratory failure with hypoxia: Secondary | ICD-10-CM | POA: Diagnosis not present

## 2017-07-10 DIAGNOSIS — R0602 Shortness of breath: Secondary | ICD-10-CM | POA: Diagnosis not present

## 2017-07-10 DIAGNOSIS — Z7982 Long term (current) use of aspirin: Secondary | ICD-10-CM | POA: Diagnosis not present

## 2017-07-10 DIAGNOSIS — Z9981 Dependence on supplemental oxygen: Secondary | ICD-10-CM | POA: Diagnosis not present

## 2017-07-10 DIAGNOSIS — I081 Rheumatic disorders of both mitral and tricuspid valves: Secondary | ICD-10-CM | POA: Diagnosis not present

## 2017-07-10 DIAGNOSIS — Z87891 Personal history of nicotine dependence: Secondary | ICD-10-CM | POA: Diagnosis not present

## 2017-07-10 DIAGNOSIS — J9622 Acute and chronic respiratory failure with hypercapnia: Secondary | ICD-10-CM | POA: Diagnosis not present

## 2017-07-10 DIAGNOSIS — E119 Type 2 diabetes mellitus without complications: Secondary | ICD-10-CM | POA: Diagnosis not present

## 2017-07-11 DIAGNOSIS — J9621 Acute and chronic respiratory failure with hypoxia: Secondary | ICD-10-CM | POA: Insufficient documentation

## 2017-07-11 DIAGNOSIS — J9622 Acute and chronic respiratory failure with hypercapnia: Secondary | ICD-10-CM | POA: Insufficient documentation

## 2017-07-11 DIAGNOSIS — R778 Other specified abnormalities of plasma proteins: Secondary | ICD-10-CM | POA: Insufficient documentation

## 2017-07-14 DIAGNOSIS — Z23 Encounter for immunization: Secondary | ICD-10-CM | POA: Diagnosis not present

## 2017-07-16 ENCOUNTER — Telehealth: Payer: Self-pay | Admitting: Family Medicine

## 2017-07-16 ENCOUNTER — Other Ambulatory Visit: Payer: Self-pay | Admitting: *Deleted

## 2017-07-16 NOTE — Telephone Encounter (Signed)
Pt was recently admitted to [facility] for respiratory issues. She was put on prednisone. This is causing her blood sugars to be elevated, last night her sugar was 574. She is not having any symptoms. Pt is currently taking 8units of lantus at night, questions if this needs to be adjusted. She will be on prednisone for the next 4 days.

## 2017-07-16 NOTE — Telephone Encounter (Signed)
Increase Lantus to 15 units at bedtime while on prednisone. Needs f/U appt scheduled ASAP

## 2017-07-16 NOTE — Telephone Encounter (Signed)
Left VM for Pt's daughter.

## 2017-07-17 NOTE — Patient Outreach (Signed)
[facility]) Care Management 07/16/17  Theresa Huang [birth year] [id number]  Transition of Care Referral  Referral Date: 07/16/17 Referral Source: [employer] Date of Discharge: 07/14/17 Facility: [facility] Discharge Diagnosis: COPD Exacerbation Insurance: [employer] telephone call to patient. HIPAA identifiers verified with patient's daughter/POA Theresa Huang). Theresa Huang stated, she handles all of the patient's affairs and she is the primary caregiver. Patient has a private paid personal care service worker to assist Theresa Huang, as needed. Theresa Huang confirmed that patient was in the hospital for COPD exacerbation. Theresa Huang reported, patient was discharged with Prednisone prior to leaving the hospital. Theresa Huang believes the Prednisone was causing patient to have side effects. Theresa Huang verbalized concern about the Prednisone, however patient only had one day left of Prednisone. Theresa Huang confirmed patient is dependent on Oxygen, continuously. Patient has an upcoming appointment with her primary MD on 07/21/17. Theresa Huang plans to speak with the MD about the medications that were discontinued during patient's hospital stay. Patient has a history of COPD, DM, Hypothyroidism, CKD 3, HTN, MI, and Asthma. Her last Hgb A1C was 5.3. Patient's health is improving, per Theresa Huang. [facility] services and benefits explained to Theresa Huang. She agreed to [facility] services.    Plan: RN CM will send [facility] referral for further education and support in managing COPD. RN CM advised patient to contact RNCM for any needs or concerns.   Lake Bells, RN, BSN, MHA/MSL, [name] Telephonic Care Manager Coordinator [facility] Direct Phone: [phone] Toll Free: [phone] Fax: [phone]

## 2017-07-18 ENCOUNTER — Other Ambulatory Visit: Payer: Self-pay

## 2017-07-18 NOTE — Patient Outreach (Signed)
[facility]) Care Management  07/18/2017  TKAI LARGE 1930-12-10 [id number]   Telephone call to patient for Initial Assessment.  No answer. HIPAA compliant voice message left  Plan: Thomas will make outreach attempt to patient within five business days.  Lazaro Arms RN, BSN, [place] Direct Dial:  [phone] Fax: [phone]

## 2017-07-22 ENCOUNTER — Encounter: Payer: Self-pay | Admitting: Family Medicine

## 2017-07-22 ENCOUNTER — Ambulatory Visit (INDEPENDENT_AMBULATORY_CARE_PROVIDER_SITE_OTHER): Payer: Medicare HMO | Admitting: Family Medicine

## 2017-07-22 VITALS — BP 135/78 | HR 96 | Ht 64.0 in | Wt 147.0 lb

## 2017-07-22 DIAGNOSIS — Z794 Long term (current) use of insulin: Secondary | ICD-10-CM

## 2017-07-22 DIAGNOSIS — J449 Chronic obstructive pulmonary disease, unspecified: Secondary | ICD-10-CM | POA: Diagnosis not present

## 2017-07-22 DIAGNOSIS — E118 Type 2 diabetes mellitus with unspecified complications: Secondary | ICD-10-CM | POA: Diagnosis not present

## 2017-07-22 DIAGNOSIS — L723 Sebaceous cyst: Secondary | ICD-10-CM | POA: Diagnosis not present

## 2017-07-22 LAB — POCT GLYCOSYLATED HEMOGLOBIN (HGB A1C): HEMOGLOBIN A1C: 6.9

## 2017-07-22 MED ORDER — INSULIN GLARGINE 100 UNIT/ML SOLOSTAR PEN: 12.0000 [IU] | PEN_INJECTOR | Freq: Every day | SUBCUTANEOUS | 6 refills | Status: DC

## 2017-07-22 NOTE — Patient Instructions (Addendum)
When you run out of the Incruse you can use of the Unity. When you run out of that then we can change you the Trelegy which has the Breo and Incruse all in one.   Increase Lantus to 12 units at bedtime.

## 2017-07-24 ENCOUNTER — Ambulatory Visit: Payer: Self-pay | Admitting: *Deleted

## 2017-07-24 ENCOUNTER — Telehealth: Payer: Self-pay | Admitting: Family Medicine

## 2017-07-24 ENCOUNTER — Other Ambulatory Visit: Payer: Self-pay

## 2017-07-24 NOTE — Patient Outreach (Addendum)
[facility]) Care Management  07/24/2017  Theresa Huang 1931-05-30 [id number]   Telephone call to patient for initial assessment. Spoke with daughter who is the POA. HIPAA verified.  The daughter was unable to speak at this time.  Plan: RN Health Coach will make outreach attempt to patient within three business days.   Lazaro Arms RN, BSN, [place] Direct Dial:  [phone] Fax: [phone]

## 2017-07-24 NOTE — Progress Notes (Deleted)
Assessment and Plan:  Hypertension: Continue medication, monitor blood pressure at home. Continue DASH diet. Cholesterol: Continue diet and exercise. Check cholesterol.  Diabetes-Continue diet and exercise. Check A1C Vitamin D Def- check level and continue medications.   Continue diet and meds as discussed. Further disposition pending results of labs. Discussed med's effects and SE's.    HPI 83 y.o. female  presents for 3 month follow up with hypertension, hyperlipidemia, diabetes and vitamin D. Her blood pressure {HAS HAS NOT:18834} been controlled at home, today their BP is BP: 135/78 She {DOES_DOES ZWC:58527} workout. She denies chest pain, shortness of breath, dizziness.  She {ACTION; IS/IS POE:42353614} on cholesterol medication and denies myalgias. Her cholesterol {ACTION; IS/IS NOT:21021397} at goal. The cholesterol last visit was:   Lab Results  Component Value Date   CHOL 216 (H) 01/20/2017   HDL 89 01/20/2017   LDLCALC 37 09/22/2015   LDLCALC 37 09/22/2015   LDLDIRECT 82 08/31/2007   TRIG 66 01/20/2017   CHOLHDL 2.4 01/20/2017   She {Has/has not:18111} been working on diet and exercise for Diabetes, and denies {Symptoms; diabetes w/o none:19199}. Last A1C in the office was:  Lab Results  Component Value Date   HGBA1C 6.9 07/22/2017   Patient is on Vitamin D supplement. Lab Results  Component Value Date   VD25OH 39 10/20/2014      Current Medications:  Current Outpatient Medications on File Prior to Visit  Medication Sig Dispense Refill  . acetaminophen (TYLENOL ARTHRITIS PAIN) 650 MG CR tablet Take 650 mg by mouth every 8 (eight) hours as needed. For pain    . alendronate (FOSAMAX) 70 MG tablet TAKE ONE TABLET ONCE A WEEK 4 tablet 12  . AMBULATORY NON FORMULARY MEDICATION Medication Name: Home continuous oxygen.  3 liters. Please provide portable prn. Dx: COPD 1 Device prn  . AMBULATORY NON FORMULARY MEDICATION Medication Name: glucometer strips to test BID.  Dx.  250.00 100 Units PRN  . artificial tears (LACRILUBE) OINT ophthalmic ointment Place into both eyes every 3 (three) hours as needed for dry eyes. (Patient not taking: Reported on 07/24/2017) 3.5 g 1  . aspirin 81 MG tablet Take 81 mg by mouth daily.      . Calcium-Vitamin D (CALTRATE 600 PLUS-VIT D PO) Take 1 tablet by mouth daily.     . fluticasone furoate-vilanterol (BREO ELLIPTA) 200-25 MCG/INH AEPB Inhale 1 puff into the lungs daily. 180 each 3  . loratadine (CLARITIN) 10 MG tablet TAKE 1 TABLET BY MOUTH ONCE DAILY 30 tablet 12  . Multiple Vitamin (MULTIVITAMIN) tablet Take 1 tablet by mouth daily.      . naproxen (NAPROSYN) 500 MG tablet TAKE ONE TABLET BY MOUTH 2 TIMES A DAY WITH A MEAL AS NEEDED FOR JAW AND EAR PAIN 60 tablet 3  . nitroGLYCERIN (NITROSTAT) 0.4 MG SL tablet Place 1 tablet (0.4 mg total) under the tongue every 5 (five) minutes as needed. For chest pain 30 tablet 0  . olopatadine (PATANOL) 0.1 % ophthalmic solution Place 1 drop into both eyes 2 (two) times daily. (Patient not taking: Reported on 07/24/2017) 5 mL 1  . QC LANCETS SUPER THIN MISC TEST TWICE DAILY 100 each 2  . sertraline (ZOLOFT) 100 MG tablet TAKE 1 & 1/2 TABLETS BY MOUTH EVERY DAY 45 tablet 6  . simvastatin (ZOCOR) 20 MG tablet Take 1 tablet (20 mg total) by mouth daily at 6 PM. 30 tablet 11  . SYNTHROID 75 MCG tablet TAKE ONE TABLET BY MOUTH EVERY  DAY BEFORE BREAKFAST (Patient taking differently: 1 tablet daily except for 1.5 tablet on Sunday.) 30 tablet 6  . terbinafine (LAMISIL) 1 % cream Apply to affected area BID until rash gone, then apply 2 more days. 30 g 3  . traMADol (ULTRAM) 50 MG tablet TAKE ONE TABLET BY MOUTH DAILY AS NEEDED 30 tablet 0  . TRUETRACK TEST test strip TEST 2 TIMES A DAY AS DIRECTED 100 each PRN  . umeclidinium bromide (INCRUSE ELLIPTA) 62.5 MCG/INH AEPB Inhale 1 puff into the lungs daily. 90 each 1  . UNIFINE PENTIPS 29G X 12MM MISC USE ONCE DAILY AS DIRECTED 100 each 11  . VENTOLIN HFA  108 (90 Base) MCG/ACT inhaler INHALE 2 PUFFS INTO THE LUNGS EVERY 4 HOURS AS NEEDED FOR WHEEZING 18 g 3   No current facility-administered medications on file prior to visit.    Medical History:  Past Medical History:  Diagnosis Date  . Anemia   . Cancer ([place])    SCC  . Carotid artery occlusion   . COPD (chronic obstructive pulmonary disease) (HCC)    wears oxygen at 3L prn daytime and 3L at night   . DDD (degenerative disc disease)   . Diabetes mellitus (HCC)    fasting blood sugar 80-100  . Diastolic dysfunction   . Dizziness   . Glaucoma   . Headache(784.0)    migranes  . Hearing loss of both ears    wears bilateral hearing aids  . Hemorrhoids   . Holter monitor, abnormal    wearing for 21 days  . Hyperlipidemia   . Hypothyroid   . Inflammatory polyps of colon with rectal bleeding ([place])   . Irregular heart beat   . Myocardial infarction ([place]) 2006  . Pneumonia    hx  . Retinopathy of left eye   . Shortness of breath    most of time  . Syncope    Allergies:  Allergies  Allergen Reactions  . Other Anaphylaxis  . Tetracyclines & Related Anaphylaxis  . Tetracycline     REACTION: arms swelling, itching, blisters     Review of Systems: [X]  = complains of  [ ]  = denies  General: Fatigue [ ]  Fever [ ]  Chills [ ]  Weakness [ ]   Insomnia [ ]  Eyes: Redness [ ]  Blurred vision [ ]  Diplopia [ ]   ENT: Congestion [ ]  Sinus Pain [ ]  Post Nasal Drip [ ]  Sore Throat [ ]  Earache [ ]   Cardiac: Chest pain/pressure [ ]  SOB [ ]  Orthopnea [ ]   Palpitations [ ]   Paroxysmal nocturnal dyspnea[ ]  Claudication [ ]  Edema [ ]   Pulmonary: Cough [ ]  Wheezing[ ]   SOB [ ]   Snoring [ ]   GI: Nausea [ ]  Vomiting[ ]  Dysphagia[ ]  Heartburn[ ]  Abdominal pain [ ]  Constipation [ ] ; Diarrhea [ ] ; BRBPR [ ]  Melena[ ]  GU: Hematuria[ ]  Dysuria [ ]  Nocturia[ ]  Urgency [ ]   Hesitancy [ ]  Discharge [ ]  Neuro: Headaches[ ]  Vertigo[ ]  Paresthesias[ ]  Spasm [ ]  Speech changes [ ]  Incoordination [ ]   Ortho:  Arthritis [ ]  Joint pain [ ]  Muscle pain [ ]  Joint swelling [ ]  Back Pain [ ]  Skin:  Rash [ ]   Pruritis [ ]  Change in skin lesion [ ]   Psych: Depression[ ]  Anxiety[ ]  Confusion [ ]  Memory loss [ ]   Heme/Lypmh: Bleeding [ ]  Bruising [ ]  Enlarged lymph nodes [ ]   Endocrine: Visual blurring [ ]  Paresthesia [ ]  Polyuria [ ]   Polydypsea [ ]    Heat/cold intolerance [ ]  Hypoglycemia [ ]   Family history- Review and unchanged Social history- Review and unchanged Physical Exam: BP 135/78   Pulse 96   Ht 5\' 4"  (1.626 m)   Wt 147 lb (66.7 kg)   SpO2 93%   BMI 25.23 kg/m  Wt Readings from Last 3 Encounters:  07/22/17 147 lb (66.7 kg)  02/17/17 147 lb (66.7 kg)  01/27/17 146 lb 4.8 oz (66.4 kg)   General Appearance: Well nourished, in no apparent distress. Eyes: PERRLA, EOMs, conjunctiva no swelling or erythema Sinuses: No Frontal/maxillary tenderness ENT/Mouth: Ext aud canals clear, TMs without erythema, bulging. No erythema, swelling, or exudate on post pharynx.  Tonsils not swollen or erythematous. Hearing normal.  Neck: Supple, thyroid normal.  Respiratory: Respiratory effort normal, BS equal bilaterally without rales, rhonchi, wheezing or stridor.  Cardio: RRR with no MRGs. Brisk peripheral pulses without edema.  Abdomen: Soft, + BS.  Non tender, no guarding, rebound, hernias, masses. Lymphatics: Non tender without lymphadenopathy.  Musculoskeletal: Full ROM, 5/5 strength, normal gait.  Skin: Warm, dry without rashes, lesions, ecchymosis.  Neuro: Cranial nerves intact. No cerebellar symptoms. Sensation intact.  Psych: Awake and oriented X 3, normal affect, Insight and Judgment appropriate.    Beatrice Lecher 9:35 PM

## 2017-07-24 NOTE — Telephone Encounter (Signed)
Call pt: please schedule her with Dr. Darene Lamer for sebaceous cyst removal.

## 2017-07-24 NOTE — Progress Notes (Signed)
Subjective:    Patient ID: Theresa Huang, female    DOB: [birth year], 83 y.o.   MRN: [id number]  HPI 83 year old female is here to follow-up for recent hospitalization for acute respiratory distress for COPD exacerbation.  She was admitted to [facility] on October 25.  The patient is experiencing excess drowsiness and her oxygen saturations were in the 80% range.  Discharged home 4 days later on October 29.  They discontinued her Spiriva as well as her fluoxetine and her glipizide.  They reported that her hemoglobin A1c was 5.3 on upon admission.  They discharged her home on prednisone and azithromycin and switched her to the Advair Diskus. She is now on Breo.  Completed her antibiotics on her prednisone.  She went back down to 8 units on her Lantus.  DM-they increased her Lantus to 18 units while she was on the prednisone.  But they also discontinued her glipizide.   COPD-she is requesting a portable oxygen concentrator.she is feeling much better and doing well at home.   She says the cyst on her back has come back. It was removed by dermatology a couple of years ago.   Hypothyroidism - doing well. Taking her medication.   Lab Results  Component Value Date   TSH 4.79 (H) 01/20/2017     Review of Systems  BP 135/78   Pulse 96   Ht 5\' 4"  (1.626 m)   Wt 147 lb (66.7 kg)   SpO2 93%   BMI 25.23 kg/m     Allergies  Allergen Reactions  . Other Anaphylaxis  . Tetracyclines & Related Anaphylaxis  . Tetracycline     REACTION: arms swelling, itching, blisters    Past Medical History:  Diagnosis Date  . Anemia   . Cancer ([place])    SCC  . Carotid artery occlusion   . COPD (chronic obstructive pulmonary disease) (HCC)    wears oxygen at 3L prn daytime and 3L at night   . DDD (degenerative disc disease)   . Diabetes mellitus (HCC)    fasting blood sugar 80-100  . Diastolic dysfunction   . Dizziness   . Glaucoma   . Headache(784.0)    migranes  . Hearing loss of  both ears    wears bilateral hearing aids  . Hemorrhoids   . Holter monitor, abnormal    wearing for 21 days  . Hyperlipidemia   . Hypothyroid   . Inflammatory polyps of colon with rectal bleeding ([place])   . Irregular heart beat   . Myocardial infarction ([place]) 2006  . Pneumonia    hx  . Retinopathy of left eye   . Shortness of breath    most of time  . Syncope     Past Surgical History:  Procedure Laterality Date  . ABDOMINAL HYSTERECTOMY    . CAROTID ENDARTERECTOMY    . COLONOSCOPY W/ POLYPECTOMY    . EYE SURGERY     cataracts  . HEMORRHOID SURGERY    . squamous cell removed    . THROAT SURGERY    . TONSILLECTOMY      Social History   Socioeconomic History  . Marital status: Widowed    Spouse name: Not on file  . Number of children: 4  . Years of education: Not on file  . Highest education level: Not on file  Social Needs  . Financial resource strain: Not on file  . Food insecurity - worry: Not on file  . Food insecurity - inability:  Not on file  . Transportation needs - medical: Not on file  . Transportation needs - non-medical: Not on file  Occupational History    Employer: RETIRED  Tobacco Use  . Smoking status: Former Smoker    Types: Cigarettes    Last attempt to quit: 09/17/1987    Years since quitting: 29.8  . Smokeless tobacco: Never Used  Substance and Sexual Activity  . Alcohol use: No  . Drug use: No  . Sexual activity: Not on file  Other Topics Concern  . Not on file  Social History Narrative  . Not on file    Family History  Problem Relation Age of Onset  . Depression Mother   . Diabetes Mother   . CAD Mother   . Heart disease Mother   . Hyperlipidemia Daughter   . CAD Father   . Heart disease Father   . Diabetes Brother   . Hypertension Son   . Breast cancer Daughter     Outpatient Encounter Medications as of 07/22/2017  Medication Sig  . acetaminophen (TYLENOL ARTHRITIS PAIN) 650 MG CR tablet Take 650 mg by mouth every 8  (eight) hours as needed. For pain  . alendronate (FOSAMAX) 70 MG tablet TAKE ONE TABLET ONCE A WEEK  . AMBULATORY NON FORMULARY MEDICATION Medication Name: Home continuous oxygen.  3 liters. Please provide portable prn. Dx: COPD  . AMBULATORY NON FORMULARY MEDICATION Medication Name: glucometer strips to test BID.  Dx. 250.00  . artificial tears (LACRILUBE) OINT ophthalmic ointment Place into both eyes every 3 (three) hours as needed for dry eyes. (Patient not taking: Reported on 07/24/2017)  . aspirin 81 MG tablet Take 81 mg by mouth daily.    . Calcium-Vitamin D (CALTRATE 600 PLUS-VIT D PO) Take 1 tablet by mouth daily.   . fluticasone furoate-vilanterol (BREO ELLIPTA) 200-25 MCG/INH AEPB Inhale 1 puff into the lungs daily.  . Insulin Glargine (LANTUS SOLOSTAR) 100 UNIT/ML Solostar Pen Inject 12 Units at bedtime into the skin.  [name] loratadine (CLARITIN) 10 MG tablet TAKE 1 TABLET BY MOUTH ONCE DAILY  . Multiple Vitamin (MULTIVITAMIN) tablet Take 1 tablet by mouth daily.    . naproxen (NAPROSYN) 500 MG tablet TAKE ONE TABLET BY MOUTH 2 TIMES A DAY WITH A MEAL AS NEEDED FOR JAW AND EAR PAIN  . nitroGLYCERIN (NITROSTAT) 0.4 MG SL tablet Place 1 tablet (0.4 mg total) under the tongue every 5 (five) minutes as needed. For chest pain  . olopatadine (PATANOL) 0.1 % ophthalmic solution Place 1 drop into both eyes 2 (two) times daily. (Patient not taking: Reported on 07/24/2017)  . QC LANCETS SUPER THIN MISC TEST TWICE DAILY  . sertraline (ZOLOFT) 100 MG tablet TAKE 1 & 1/2 TABLETS BY MOUTH EVERY DAY  . simvastatin (ZOCOR) 20 MG tablet Take 1 tablet (20 mg total) by mouth daily at 6 PM.  . SYNTHROID 75 MCG tablet TAKE ONE TABLET BY MOUTH EVERY DAY BEFORE BREAKFAST (Patient taking differently: 1 tablet daily except for 1.5 tablet on Sunday.)  . terbinafine (LAMISIL) 1 % cream Apply to affected area BID until rash gone, then apply 2 more days.  . traMADol (ULTRAM) 50 MG tablet TAKE ONE TABLET BY MOUTH DAILY  AS NEEDED  . TRUETRACK TEST test strip TEST 2 TIMES A DAY AS DIRECTED  . umeclidinium bromide (INCRUSE ELLIPTA) 62.5 MCG/INH AEPB Inhale 1 puff into the lungs daily.  [name] UNIFINE PENTIPS 29G X 12MM MISC USE ONCE DAILY AS DIRECTED  .  VENTOLIN HFA 108 (90 Base) MCG/ACT inhaler INHALE 2 PUFFS INTO THE LUNGS EVERY 4 HOURS AS NEEDED FOR WHEEZING  . [DISCONTINUED] LANTUS SOLOSTAR 100 UNIT/ML Solostar Pen INJECT 8 UNITS INTO THE SKIN AT BEDTIME  . [DISCONTINUED] glipiZIDE (GLIPIZIDE XL) 2.5 MG 24 hr tablet Take 1 tablet (2.5 mg total) by mouth daily.  . [DISCONTINUED] HYDROcodone-acetaminophen (NORCO/VICODIN) 5-325 MG tablet Take 1 tablet by mouth every 6 (six) hours as needed.  . [DISCONTINUED] triamcinolone ointment (KENALOG) 0.5 % Apply 1 application topically at bedtime. (Patient not taking: Reported on 01/27/2017)   No facility-administered encounter medications on file as of 07/22/2017.          Objective:   Physical Exam Physical Exam  Constitutional: She is oriented to person, place, and time. She appears well-developed and well-nourished.  HENT:  Head: Normocephalic and atraumatic.  Cardiovascular: Normal rate, regular rhythm and normal heart sounds.  Pulmonary/Chest: Effort normal and breath sounds normal.  Neurological: She is alert and oriented to person, place, and time.  Skin: Skin is warm and dry.  She has a sebaceous cyst on her mid-back just below her bra line.   Psychiatric: She has a normal mood and affect. Her behavior is normal.          Assessment & Plan:  COPD exacerbation - stable. She is doing well Doesn't have her oxygen on today here in the office. Will get a script for portable oxygen.  When she runs out of INcruse ok touse her Spiriva that she still has at home.Plan will be to change to Trelegy when I see her back but needs to sue what she already has.  Increase Lantus to 12 units.    DM- Now off glipizide. Home glucose is OK. Will monitor. F/U in 3 mo.    Sebaceous Cyst - refer to Dr. Darene Lamer for removal.    Hypothyroidisem - stable. No recent changes. Taking extra half a tab one day a week. Plan to recheck TSH at follow up.

## 2017-07-24 NOTE — Patient Outreach (Signed)
[facility]) Care Management  [place]  07/24/2017   VANETTE NOGUCHI 1931-01-20 [id number]  Subjective: RN Health Coach spoke with [name] the POA for the patient for initial assessment.  HIPAA verified.  The patient lives with her daughter and son-in-law.  The daughter is the caregiver for the patient.  The patient has assistance with her ADLS' and dependent with IADLS'.  The patient is adherent with her medications. The patient weighs herself daily and records the weight.  She is not following a diet.  RN Health Coach discussed diet. [name] verbalized she understood. The patient has had her flu and pneumonia shot this year.  Objective:   Encounter Medications:  Outpatient Encounter Medications as of 07/24/2017  Medication Sig Note  . acetaminophen (TYLENOL ARTHRITIS PAIN) 650 MG CR tablet Take 650 mg by mouth every 8 (eight) hours as needed. For pain   . alendronate (FOSAMAX) 70 MG tablet TAKE ONE TABLET ONCE A WEEK   . AMBULATORY NON FORMULARY MEDICATION Medication Name: Home continuous oxygen.  3 liters. Please provide portable prn. Dx: COPD   . AMBULATORY NON FORMULARY MEDICATION Medication Name: glucometer strips to test BID.  Dx. 250.00   . aspirin 81 MG tablet Take 81 mg by mouth daily.     . Calcium-Vitamin D (CALTRATE 600 PLUS-VIT D PO) Take 1 tablet by mouth daily.    . fluticasone furoate-vilanterol (BREO ELLIPTA) 200-25 MCG/INH AEPB Inhale 1 puff into the lungs daily.   . Insulin Glargine (LANTUS SOLOSTAR) 100 UNIT/ML Solostar Pen Inject 12 Units at bedtime into the skin.   [name] loratadine (CLARITIN) 10 MG tablet TAKE 1 TABLET BY MOUTH ONCE DAILY   . Multiple Vitamin (MULTIVITAMIN) tablet Take 1 tablet by mouth daily.     . naproxen (NAPROSYN) 500 MG tablet TAKE ONE TABLET BY MOUTH 2 TIMES A DAY WITH A MEAL AS NEEDED FOR JAW AND EAR PAIN   . nitroGLYCERIN (NITROSTAT) 0.4 MG SL tablet Place 1 tablet (0.4 mg total) under the tongue every 5 (five) minutes  as needed. For chest pain   . QC LANCETS SUPER THIN MISC TEST TWICE DAILY   . sertraline (ZOLOFT) 100 MG tablet TAKE 1 & 1/2 TABLETS BY MOUTH EVERY DAY   . simvastatin (ZOCOR) 20 MG tablet Take 1 tablet (20 mg total) by mouth daily at 6 PM.   . SYNTHROID 75 MCG tablet TAKE ONE TABLET BY MOUTH EVERY DAY BEFORE BREAKFAST (Patient taking differently: 1 tablet daily except for 1.5 tablet on Sunday.)   . terbinafine (LAMISIL) 1 % cream Apply to affected area BID until rash gone, then apply 2 more days.   . traMADol (ULTRAM) 50 MG tablet TAKE ONE TABLET BY MOUTH DAILY AS NEEDED   . TRUETRACK TEST test strip TEST 2 TIMES A DAY AS DIRECTED   . UNIFINE PENTIPS 29G X 12MM MISC USE ONCE DAILY AS DIRECTED   . VENTOLIN HFA 108 (90 Base) MCG/ACT inhaler INHALE 2 PUFFS INTO THE LUNGS EVERY 4 HOURS AS NEEDED FOR WHEEZING   . artificial tears (LACRILUBE) OINT ophthalmic ointment Place into both eyes every 3 (three) hours as needed for dry eyes. (Patient not taking: Reported on 07/24/2017) 07/24/2017: She has not used it in a while  . olopatadine (PATANOL) 0.1 % ophthalmic solution Place 1 drop into both eyes 2 (two) times daily. (Patient not taking: Reported on 07/24/2017)   . umeclidinium bromide (INCRUSE ELLIPTA) 62.5 MCG/INH AEPB Inhale 1 puff into the lungs daily.  No facility-administered encounter medications on file as of 07/24/2017.     Functional Status:  In your present state of health, do you have any difficulty performing the following activities: 07/24/2017  Hearing? Y  Vision? Y  Comment glassess  Difficulty concentrating or making decisions? Y  Comment sometimes  Walking or climbing stairs? Y  Dressing or bathing? N  Doing errands, shopping? Y  Some recent data might be hidden    Fall/Depression Screening: Fall Risk  07/24/2017 07/16/2017 01/24/2017  Falls in the past year? Yes Yes No  Number falls in past yr: 2 or more 2 or more -  Injury with Fall? Yes No -  Comment She broke her foot  6-8 months ago - -  Risk Factor Category  High Fall Risk High Fall Risk -  Risk for fall due to : History of fall(s);Impaired balance/gait History of fall(s);Impaired mobility -  Follow up Falls evaluation completed Falls evaluation completed -   PHQ 2/9 Scores 07/24/2017 07/22/2017 07/16/2017 01/20/2017 12/10/2016 12/25/2015 07/12/2014  PHQ - 2 Score 2 4 - 0 0 0 0  PHQ- 9 Score 8 12 - - - - -  Exception Documentation - - Other- indicate reason in comment box - - - -  Not completed - - Daughter answered screening questions on patient's behalf.  - - - -    Assessment: Patient will benefit from health coach outreach for disease management and support.   THN CM Care Plan Problem One     Most Recent Value  Care Plan Problem One  Knowledge deficit  Role Documenting the Problem One  Health Coach  Care Plan for Problem One  Active  THN Long Term Goal   In 90 days the patient and caregiver will learn the copd action plan  THN Long Term Goal Start Date  07/24/17  Interventions for Problem One Long Term Goal  RN health Coach will send educational material.  THN CM Short Term Goal #1   IN 30 days the caregiver will verbalize no falls since lasst conversation  THN CM Short Term Goal #1 Start Date  07/24/17  Interventions for Short Term Goal #1  RN Health Coach will send EMMI concerning falls  THN CM Short Term Goal #2   In 30 days the caregiver will verbalize the patient is maintaing weight   THN CM Short Term Goal #2 Start Date  07/24/17  Interventions for Short Term Goal #2  RN Health Coach dicussed diet and will send information about diet.       Plan:RN Health Coach will provide ongoing education for patient on COPD through phone calls and sending printed information to patient for further discussion.  RN Health Coach will send welcome packet with consent to patient as well as printed information on COPD.  RN Health Coach will send initial barriers letter, assessment, and care plan to primary care  physician.  RN Health Coach will contact patient in the month of December and patient agrees to next outreach.  Lazaro Arms RN, BSN, [place] Direct Dial:  [phone] Fax: [phone]

## 2017-07-24 NOTE — Progress Notes (Deleted)
Subjective:    Patient ID: Theresa Huang, female    DOB: 1931-07-24, 83 y.o.   MRN: [id number]  HPI 83 year old female is here to follow-up for recent hospitalization for acute respiratory distress for COPD exacerbation.  She was admitted to [facility] on October 25.  The patient is experiencing excess drowsiness and her oxygen saturations were in the 80% range.  Discharged home 4 days later on October 29.  They discontinued her Spiriva as well as her fluoxetine and her glipizide.  They reported that her hemoglobin A1c was 5.3 on upon admission.  They discharged her home on prednisone and azithromycin and switched her to the Advair Diskus. She is now on Breo.  Completed her antibiotics on her prednisone.  She went back down to 8 units on her Lantus.  DM-they increased her Lantus to 18 units while she was on the prednisone.  But they also discontinued her glipizide. She was having low blood sugars.    COPD-she is requesting a portable oxygen concentrator.  Review of Systems  BP 135/78   Pulse 96   Ht 5\' 4"  (1.626 m)   Wt 147 lb (66.7 kg)   SpO2 93%   BMI 25.23 kg/m     Allergies  Allergen Reactions  . Other Anaphylaxis  . Tetracyclines & Related Anaphylaxis  . Tetracycline     REACTION: arms swelling, itching, blisters    Past Medical History:  Diagnosis Date  . Anemia   . Cancer ([place])    SCC  . Carotid artery occlusion   . COPD (chronic obstructive pulmonary disease) (HCC)    wears oxygen at 3L prn daytime and 3L at night   . DDD (degenerative disc disease)   . Diabetes mellitus (HCC)    fasting blood sugar 80-100  . Diastolic dysfunction   . Dizziness   . Glaucoma   . Headache(784.0)    migranes  . Hearing loss of both ears    wears bilateral hearing aids  . Hemorrhoids   . Holter monitor, abnormal    wearing for 21 days  . Hyperlipidemia   . Hypothyroid   . Inflammatory polyps of colon with rectal bleeding ([place])   . Irregular heart beat   .  Myocardial infarction ([place]) 2006  . Pneumonia    hx  . Retinopathy of left eye   . Shortness of breath    most of time  . Syncope     Past Surgical History:  Procedure Laterality Date  . ABDOMINAL HYSTERECTOMY    . CAROTID ENDARTERECTOMY    . COLONOSCOPY W/ POLYPECTOMY    . EYE SURGERY     cataracts  . HEMORRHOID SURGERY    . squamous cell removed    . THROAT SURGERY    . TONSILLECTOMY      Social History   Socioeconomic History  . Marital status: Widowed    Spouse name: Not on file  . Number of children: 4  . Years of education: Not on file  . Highest education level: Not on file  Social Needs  . Financial resource strain: Not on file  . Food insecurity - worry: Not on file  . Food insecurity - inability: Not on file  . Transportation needs - medical: Not on file  . Transportation needs - non-medical: Not on file  Occupational History    Employer: RETIRED  Tobacco Use  . Smoking status: Former Smoker    Types: Cigarettes    Last attempt to quit:  09/17/1987    Years since quitting: 29.8  . Smokeless tobacco: Never Used  Substance and Sexual Activity  . Alcohol use: No  . Drug use: No  . Sexual activity: Not on file  Other Topics Concern  . Not on file  Social History Narrative  . Not on file    Family History  Problem Relation Age of Onset  . Depression Mother   . Diabetes Mother   . CAD Mother   . Heart disease Mother   . Hyperlipidemia Daughter   . CAD Father   . Heart disease Father   . Diabetes Brother   . Hypertension Son   . Breast cancer Daughter     Outpatient Encounter Medications as of 07/22/2017  Medication Sig  . acetaminophen (TYLENOL ARTHRITIS PAIN) 650 MG CR tablet Take 650 mg by mouth every 8 (eight) hours as needed. For pain  . alendronate (FOSAMAX) 70 MG tablet TAKE ONE TABLET ONCE A WEEK  . AMBULATORY NON FORMULARY MEDICATION Medication Name: Home continuous oxygen.  3 liters. Please provide portable prn. Dx: COPD  . AMBULATORY  NON FORMULARY MEDICATION Medication Name: glucometer strips to test BID.  Dx. 250.00  . artificial tears (LACRILUBE) OINT ophthalmic ointment Place into both eyes every 3 (three) hours as needed for dry eyes. (Patient not taking: Reported on 07/24/2017)  . aspirin 81 MG tablet Take 81 mg by mouth daily.    . Calcium-Vitamin D (CALTRATE 600 PLUS-VIT D PO) Take 1 tablet by mouth daily.   . fluticasone furoate-vilanterol (BREO ELLIPTA) 200-25 MCG/INH AEPB Inhale 1 puff into the lungs daily.  . Insulin Glargine (LANTUS SOLOSTAR) 100 UNIT/ML Solostar Pen Inject 12 Units at bedtime into the skin.  [name] loratadine (CLARITIN) 10 MG tablet TAKE 1 TABLET BY MOUTH ONCE DAILY  . Multiple Vitamin (MULTIVITAMIN) tablet Take 1 tablet by mouth daily.    . naproxen (NAPROSYN) 500 MG tablet TAKE ONE TABLET BY MOUTH 2 TIMES A DAY WITH A MEAL AS NEEDED FOR JAW AND EAR PAIN  . nitroGLYCERIN (NITROSTAT) 0.4 MG SL tablet Place 1 tablet (0.4 mg total) under the tongue every 5 (five) minutes as needed. For chest pain  . olopatadine (PATANOL) 0.1 % ophthalmic solution Place 1 drop into both eyes 2 (two) times daily. (Patient not taking: Reported on 07/24/2017)  . QC LANCETS SUPER THIN MISC TEST TWICE DAILY  . sertraline (ZOLOFT) 100 MG tablet TAKE 1 & 1/2 TABLETS BY MOUTH EVERY DAY  . simvastatin (ZOCOR) 20 MG tablet Take 1 tablet (20 mg total) by mouth daily at 6 PM.  . SYNTHROID 75 MCG tablet TAKE ONE TABLET BY MOUTH EVERY DAY BEFORE BREAKFAST (Patient taking differently: 1 tablet daily except for 1.5 tablet on Sunday.)  . terbinafine (LAMISIL) 1 % cream Apply to affected area BID until rash gone, then apply 2 more days.  . traMADol (ULTRAM) 50 MG tablet TAKE ONE TABLET BY MOUTH DAILY AS NEEDED  . TRUETRACK TEST test strip TEST 2 TIMES A DAY AS DIRECTED  . umeclidinium bromide (INCRUSE ELLIPTA) 62.5 MCG/INH AEPB Inhale 1 puff into the lungs daily.  [name] UNIFINE PENTIPS 29G X 12MM MISC USE ONCE DAILY AS DIRECTED  . VENTOLIN HFA  108 (90 Base) MCG/ACT inhaler INHALE 2 PUFFS INTO THE LUNGS EVERY 4 HOURS AS NEEDED FOR WHEEZING  . [DISCONTINUED] LANTUS SOLOSTAR 100 UNIT/ML Solostar Pen INJECT 8 UNITS INTO THE SKIN AT BEDTIME  . [DISCONTINUED] glipiZIDE (GLIPIZIDE XL) 2.5 MG 24 hr tablet Take 1  tablet (2.5 mg total) by mouth daily.  . [DISCONTINUED] HYDROcodone-acetaminophen (NORCO/VICODIN) 5-325 MG tablet Take 1 tablet by mouth every 6 (six) hours as needed.  . [DISCONTINUED] triamcinolone ointment (KENALOG) 0.5 % Apply 1 application topically at bedtime. (Patient not taking: Reported on 01/27/2017)   No facility-administered encounter medications on file as of 07/22/2017.          Objective:   Physical Exam  Physical Exam  Constitutional: She is oriented to person, place, and time. She appears well-developed and well-nourished.  HENT:  Head: Normocephalic and atraumatic.  Cardiovascular: Normal rate, regular rhythm and normal heart sounds.  Pulmonary/Chest: Effort normal and breath sounds normal.  Neurological: She is alert and oriented to person, place, and time.  Skin: Skin is warm and dry.  Psychiatric: She has a normal mood and affect. Her behavior is normal.        Assessment & Plan:  COPD exacerbation -   DM-

## 2017-07-25 MED ORDER — AMBULATORY NON FORMULARY MEDICATION: 0 refills | Status: DC

## 2017-07-25 NOTE — Telephone Encounter (Signed)
Got patient scheduled with Dr. Darene Lamer on Nov. 14th. Thanks

## 2017-07-25 NOTE — Addendum Note (Signed)
Addended by: Beatrice Lecher D on: 07/25/2017 10:53 AM   Modules accepted: Orders

## 2017-07-28 DIAGNOSIS — N3941 Urge incontinence: Secondary | ICD-10-CM | POA: Diagnosis not present

## 2017-07-28 DIAGNOSIS — E119 Type 2 diabetes mellitus without complications: Secondary | ICD-10-CM | POA: Diagnosis not present

## 2017-07-29 ENCOUNTER — Other Ambulatory Visit: Payer: Self-pay | Admitting: Family Medicine

## 2017-07-30 ENCOUNTER — Encounter: Payer: Self-pay | Admitting: Sports Medicine

## 2017-07-30 ENCOUNTER — Ambulatory Visit (INDEPENDENT_AMBULATORY_CARE_PROVIDER_SITE_OTHER): Payer: Medicare HMO | Admitting: Sports Medicine

## 2017-07-30 DIAGNOSIS — L723 Sebaceous cyst: Secondary | ICD-10-CM

## 2017-07-30 MED ORDER — HYDROCODONE-ACETAMINOPHEN 5-325 MG PO TABS: 1.0000 | ORAL_TABLET | Freq: Three times a day (TID) | ORAL | 0 refills | Status: DC | PRN

## 2017-07-30 NOTE — Patient Instructions (Signed)
Incision Care, Adult An incision is a surgical cut that is made through your skin. Most incisions are closed after surgery. Your incision may be closed with stitches (sutures), staples, skin glue, or adhesive strips. You may need to return to your health care provider to have sutures or staples removed. This may occur several days to several weeks after your surgery. The incision needs to be cared for properly to prevent infection. How to care for your incision Incision care   Follow instructions from your health care provider about how to take care of your incision. Make sure you: ? Wash your hands with soap and water before you change the bandage (dressing). If soap and water are not available, use hand sanitizer. ? Change your dressing as told by your health care provider. ? Leave sutures, skin glue, or adhesive strips in place. These skin closures may need to stay in place for 2 weeks or longer. If adhesive strip edges start to loosen and curl up, you may trim the loose edges. Do not remove adhesive strips completely unless your health care provider tells you to do that.  Check your incision area every day for signs of infection. Check for: ? More redness, swelling, or pain. ? More fluid or blood. ? Warmth. ? Pus or a bad smell.  Ask your health care provider how to clean the incision. This may include: ? Using mild soap and water. ? Using a clean towel to pat the incision dry after cleaning it. ? Applying a cream or ointment. Do this only as told by your health care provider. ? Covering the incision with a clean dressing.  Ask your health care provider when you can leave the incision uncovered.  Do not take baths, swim, or use a hot tub until your health care provider approves. Ask your health care provider if you can take showers. You may only be allowed to take sponge baths for bathing. Medicines  If you were prescribed an antibiotic medicine, cream, or ointment, take or apply the  antibiotic as told by your health care provider. Do not stop taking or applying the antibiotic even if your condition improves.  Take over-the-counter and prescription medicines only as told by your health care provider. General instructions  Limit movement around your incision to improve healing. ? Avoid straining, lifting, or exercise for the first month, or for as long as told by your health care provider. ? Follow instructions from your health care provider about returning to your normal activities. ? Ask your health care provider what activities are safe.  Protect your incision from the sun when you are outside for the first 6 months, or for as long as told by your health care provider. Apply sunscreen around the scar or cover it up.  Keep all follow-up visits as told by your health care provider. This is important. Contact a health care provider if:  Your have more redness, swelling, or pain around the incision.  You have more fluid or blood coming from the incision.  Your incision feels warm to the touch.  You have pus or a bad smell coming from the incision.  You have a fever or shaking chills.  You are nauseous or you vomit.  You are dizzy.  Your sutures or staples come undone. Get help right away if:  You have a red streak coming from your incision.  Your incision bleeds through the dressing and the bleeding does not stop with gentle pressure.  The edges of   your incision open up and separate.  You have severe pain.  You have a rash.  You are confused.  You faint.  You have trouble breathing and a fast heartbeat. This information is not intended to replace advice given to you by your health care provider. Make sure you discuss any questions you have with your health care provider. Document Released: 03/22/2005 Document Revised: 05/10/2016 Document Reviewed: 03/20/2016 Elsevier Interactive Patient Education  2018 Elsevier Inc.  

## 2017-07-30 NOTE — Assessment & Plan Note (Signed)
Surgical excision as above, return in 10 days for a wound check and suture removal. Hydrocodone for postprocedural pain.

## 2017-07-30 NOTE — Progress Notes (Signed)
  Subjective:    CC: Mass on back  HPI: This is a pleasant 83 year old female, for a long time she has had a mass on her back, I&D x1, as well as attempted excision with dermatology, persistent.  It is painful, interferes with her bra strap.  Symptoms are moderate, persistent, she desires excision.  Past medical history:  Negative.  See flowsheet/record as well for more information.  Surgical history: Negative.  See flowsheet/record as well for more information.  Family history: Negative.  See flowsheet/record as well for more information.  Social history: Negative.  See flowsheet/record as well for more information.  Allergies, and medications have been entered into the medical record, reviewed, and no changes needed.   Review of Systems: No fevers, chills, night sweats, weight loss, chest pain, or shortness of breath.   Objective:    General: Well Developed, well nourished, and in no acute distress.  Neuro: Alert and oriented x3, extra-ocular muscles intact, sensation grossly intact.  HEENT: Normocephalic, atraumatic, pupils equal round reactive to light, neck supple, no masses, no lymphadenopathy, thyroid nonpalpable.  Skin: Warm and dry, no rashes.  There is a 4 cm well-defined, movable nodule on her back. Cardiac: Regular rate and rhythm, no murmurs rubs or gallops, no lower extremity edema.  Respiratory: Clear to auscultation bilaterally. Not using accessory muscles, speaking in full sentences.  Procedure: Excision of 4 cm sebaceous cyst on the back Risks, benefits, and alternatives explained and consent obtained. Time out conducted. Surface prepped with alcohol. 10cc lidocaine with epinephine infiltrated in a field block. Adequate anesthesia ensured. Area prepped and draped in a sterile fashion. Excision performed with: I made an elliptical incision with a #15 blade, then using both sharp and blunt dissection I carried the dissection around the sebaceous cyst, the cyst was  entered and sebum was removed, I then removed the entirety of the cyst and its wall, I closed the incision with #7 4-0 simple interrupted Ethilon stitches. Hemostasis achieved. Pt stable.  Impression and Recommendations:    Sebaceous cyst Surgical excision as above, return in 10 days for a wound check and suture removal. Hydrocodone for postprocedural pain.  ___________________________________________ Gwen Her. Dianah Field, M.D., ABFM., CAQSM. Primary Care and [place] Instructor of [place] [facility]

## 2017-08-06 ENCOUNTER — Other Ambulatory Visit: Payer: Self-pay | Admitting: Family Medicine

## 2017-08-06 MED ORDER — AMBULATORY NON FORMULARY MEDICATION: 0 refills | Status: DC

## 2017-08-06 NOTE — Progress Notes (Signed)
Order reprinted for oxygen.

## 2017-08-08 DIAGNOSIS — J45909 Unspecified asthma, uncomplicated: Secondary | ICD-10-CM | POA: Diagnosis not present

## 2017-08-08 DIAGNOSIS — Q348 Other specified congenital malformations of respiratory system: Secondary | ICD-10-CM | POA: Diagnosis not present

## 2017-08-13 ENCOUNTER — Other Ambulatory Visit: Payer: Self-pay | Admitting: Osteopathic Medicine

## 2017-08-13 ENCOUNTER — Ambulatory Visit: Payer: Medicare HMO | Admitting: Sports Medicine

## 2017-08-13 DIAGNOSIS — Z0189 Encounter for other specified special examinations: Secondary | ICD-10-CM

## 2017-08-13 DIAGNOSIS — R6889 Other general symptoms and signs: Secondary | ICD-10-CM

## 2017-08-14 ENCOUNTER — Telehealth: Payer: Self-pay | Admitting: Family Medicine

## 2017-08-14 ENCOUNTER — Encounter: Payer: Self-pay | Admitting: Sports Medicine

## 2017-08-14 ENCOUNTER — Ambulatory Visit (INDEPENDENT_AMBULATORY_CARE_PROVIDER_SITE_OTHER): Payer: Medicare HMO | Admitting: Sports Medicine

## 2017-08-14 DIAGNOSIS — M19012 Primary osteoarthritis, left shoulder: Secondary | ICD-10-CM

## 2017-08-14 DIAGNOSIS — L723 Sebaceous cyst: Secondary | ICD-10-CM | POA: Diagnosis not present

## 2017-08-14 NOTE — Telephone Encounter (Signed)
Received phone call from [place], a RT with [employer]. They supply the Pt's home oxygen. While doing a respiratory eval, it was recommended the Pt may benefit from a home machine to help bring her CO2 levels down. This may also help decrease the amount of hospital visits the Pt has associated with her COPD. The two machines recommended were Trilogy or Astral. This also would be monitored by a RT that comes to the home.  If PCP would like to order this, Louanne Belton is a good contact to determine just how the order needs to be written for insurance purposes. Her phone is: [phone]. Will route.

## 2017-08-14 NOTE — Assessment & Plan Note (Signed)
Doing well post surgical excision about 2 weeks ago, sutures removed today, incision is clean, dry, intact. This is a surgical cure.

## 2017-08-14 NOTE — Progress Notes (Signed)
   Subjective:    I'm seeing this patient as a consultation for: Dr. Beatrice Lecher  CC: Left shoulder pain, wound check  HPI: 2 weeks ago I removed a sebaceous cyst on her back, doing well.  Sutures will be removed today.  Left shoulder pain: Present for decades, located over the deltoid and the joint line, worse with most motions, gelling.  X-rays in the past have shown osteoarthritis, we have been avoiding NSAIDs due to her chronic renal insufficiency.  Past medical history, Surgical history, Family history not pertinant except as noted below, Social history, Allergies, and medications have been entered into the medical record, reviewed, and no changes needed.   Review of Systems: No headache, visual changes, nausea, vomiting, diarrhea, constipation, dizziness, abdominal pain, skin rash, fevers, chills, night sweats, weight loss, swollen lymph nodes, body aches, joint swelling, muscle aches, chest pain, shortness of breath, mood changes, visual or auditory hallucinations.   Objective:   General: Well Developed, well nourished, and in no acute distress.  Neuro:  Extra-ocular muscles intact, able to move all 4 extremities, sensation grossly intact.  Deep tendon reflexes tested were normal. Psych: Alert and oriented, mood congruent with affect. ENT:  Ears and nose appear unremarkable.  Hearing grossly normal. Neck: Unremarkable overall appearance, trachea midline.  No visible thyroid enlargement. Eyes: Conjunctivae and lids appear unremarkable.  Pupils equal and round. Skin: Warm and dry, no rashes noted.  Incision is clean, dry, intact, sutures removed Cardiovascular: Pulses palpable, no extremity edema. Left shoulder: Range of motion is limited in abduction and external rotation, crepitus palpable at the joint with range of motion.  Impression and Recommendations:   This case required medical decision making of moderate complexity.  Primary osteoarthritis of left shoulder X-rays  show widespread degenerative changes as expected, avoiding NSAIDs due to renal insufficiency, formal physical therapy. Return in 6 weeks, glenohumeral injection if no better.  Sebaceous cyst Doing well post surgical excision about 2 weeks ago, sutures removed today, incision is clean, dry, intact. This is a surgical cure. ___________________________________________ Gwen Her. Dianah Field, M.D., ABFM., CAQSM. Primary Care and [place] Instructor of [place] of [facility]

## 2017-08-14 NOTE — Assessment & Plan Note (Signed)
X-rays show widespread degenerative changes as expected, avoiding NSAIDs due to renal insufficiency, formal physical therapy. Return in 6 weeks, glenohumeral injection if no better.

## 2017-08-26 ENCOUNTER — Telehealth: Payer: Self-pay | Admitting: Family Medicine

## 2017-08-26 ENCOUNTER — Other Ambulatory Visit: Payer: Self-pay

## 2017-08-26 NOTE — Telephone Encounter (Signed)
Call patient to schedule her for spirometry in January.

## 2017-08-26 NOTE — Patient Outreach (Signed)
[place] [facility]) Care Management  08/26/2017  Theresa Huang [birth year] [id number]   Telephone call to patient for monthly outreach.  No answer. HIPAA compliant voice message left with contact information.  Plan: RN Health Coach will attempt outreach to the patient within the month of December.  Lazaro Arms RN, BSN, [place] Direct Dial:  [phone] Fax: [phone]

## 2017-08-27 ENCOUNTER — Ambulatory Visit: Payer: Medicare HMO | Admitting: Physical Therapy

## 2017-08-28 DIAGNOSIS — J449 Chronic obstructive pulmonary disease, unspecified: Secondary | ICD-10-CM | POA: Diagnosis not present

## 2017-08-28 DIAGNOSIS — N3941 Urge incontinence: Secondary | ICD-10-CM | POA: Diagnosis not present

## 2017-08-28 DIAGNOSIS — E119 Type 2 diabetes mellitus without complications: Secondary | ICD-10-CM | POA: Diagnosis not present

## 2017-09-07 DIAGNOSIS — Q348 Other specified congenital malformations of respiratory system: Secondary | ICD-10-CM | POA: Diagnosis not present

## 2017-09-07 DIAGNOSIS — J45909 Unspecified asthma, uncomplicated: Secondary | ICD-10-CM | POA: Diagnosis not present

## 2017-09-11 ENCOUNTER — Other Ambulatory Visit: Payer: Self-pay

## 2017-09-11 NOTE — Patient Outreach (Signed)
[place] [facility]) Care Management  09/11/2017  Theresa Huang 02/21/1931 [id number]   2nd telephone call to the patient for monthly assessment. No answer. HIPAA compliant voicemail left with contact information.  Plan:  [facility] will make an outreach attempt to the patient in the month of January.  Lazaro Arms RN, BSN, [place] Direct Dial:  [phone] Fax: [phone]

## 2017-09-23 ENCOUNTER — Ambulatory Visit: Payer: Medicare HMO | Admitting: Family Medicine

## 2017-09-23 ENCOUNTER — Other Ambulatory Visit: Payer: Medicare HMO

## 2017-09-25 ENCOUNTER — Other Ambulatory Visit: Payer: Medicare HMO

## 2017-09-25 ENCOUNTER — Ambulatory Visit: Payer: Medicare HMO | Admitting: Sports Medicine

## 2017-09-28 DIAGNOSIS — J449 Chronic obstructive pulmonary disease, unspecified: Secondary | ICD-10-CM | POA: Diagnosis not present

## 2017-09-29 DIAGNOSIS — E119 Type 2 diabetes mellitus without complications: Secondary | ICD-10-CM | POA: Diagnosis not present

## 2017-09-29 DIAGNOSIS — N3941 Urge incontinence: Secondary | ICD-10-CM | POA: Diagnosis not present

## 2017-10-07 ENCOUNTER — Other Ambulatory Visit: Payer: Medicare HMO

## 2017-10-07 ENCOUNTER — Ambulatory Visit (INDEPENDENT_AMBULATORY_CARE_PROVIDER_SITE_OTHER): Payer: Medicare HMO | Admitting: Family Medicine

## 2017-10-07 ENCOUNTER — Ambulatory Visit (INDEPENDENT_AMBULATORY_CARE_PROVIDER_SITE_OTHER): Payer: Medicare HMO | Admitting: Sports Medicine

## 2017-10-07 ENCOUNTER — Encounter: Payer: Self-pay | Admitting: Sports Medicine

## 2017-10-07 ENCOUNTER — Encounter: Payer: Self-pay | Admitting: Family Medicine

## 2017-10-07 VITALS — BP 135/43 | HR 93 | Temp 97.9°F | Resp 20 | Wt 151.0 lb

## 2017-10-07 DIAGNOSIS — M19012 Primary osteoarthritis, left shoulder: Secondary | ICD-10-CM

## 2017-10-07 DIAGNOSIS — J438 Other emphysema: Secondary | ICD-10-CM | POA: Diagnosis not present

## 2017-10-07 DIAGNOSIS — J45909 Unspecified asthma, uncomplicated: Secondary | ICD-10-CM | POA: Diagnosis not present

## 2017-10-07 DIAGNOSIS — Z794 Long term (current) use of insulin: Secondary | ICD-10-CM | POA: Diagnosis not present

## 2017-10-07 DIAGNOSIS — J449 Chronic obstructive pulmonary disease, unspecified: Secondary | ICD-10-CM

## 2017-10-07 DIAGNOSIS — J9611 Chronic respiratory failure with hypoxia: Secondary | ICD-10-CM

## 2017-10-07 DIAGNOSIS — E118 Type 2 diabetes mellitus with unspecified complications: Secondary | ICD-10-CM | POA: Diagnosis not present

## 2017-10-07 LAB — PULMONARY FUNCTION TEST

## 2017-10-07 MED ORDER — ALBUTEROL SULFATE 1.25 MG/3ML IN NEBU: 1.0000 | INHALATION_SOLUTION | Freq: Four times a day (QID) | RESPIRATORY_TRACT | 12 refills | Status: AC | PRN

## 2017-10-07 MED ORDER — AMBULATORY NON FORMULARY MEDICATION: 0 refills | Status: DC

## 2017-10-07 MED ORDER — ALBUTEROL SULFATE (2.5 MG/3ML) 0.083% IN NEBU
2.5000 mg | INHALATION_SOLUTION | Freq: Once | RESPIRATORY_TRACT | Status: AC
  Administered 2017-10-07: 2.5 mg via RESPIRATORY_TRACT

## 2017-10-07 MED ORDER — FLUTICASONE-UMECLIDIN-VILANT 100-62.5-25 MCG/INH IN AEPB: 1.0000 | INHALATION_SPRAY | Freq: Every day | RESPIRATORY_TRACT | 5 refills | Status: DC

## 2017-10-07 NOTE — Progress Notes (Addendum)
Subjective:    Patient ID: Theresa Huang, female    DOB: May 31, 1931, 84 y.o.   MRN: [id number]  HPI Here for follow-up for COPD-she is currently on Incruse and she is out of alubterol. She says that she used to have a nebulizer machine but it has been old and they have unable to find it for a couple of years at this point in time.  In fact if she feels more short of breath she just turns her oxygen up to 4 L.  Normally she is on 2 L continuous.  DM- she did have some questions about her blood sugars.  She said she had a couple of days where her blood sugars were in the 140s in the morning and just wants to be clear on what her goal is.  Her daughter was wondering if she really needs to be on insulin shot in the morning as well as the evening.  She says one day her sugar was over 400.   Review of Systems  BP (!) 135/43 (BP Location: Right Arm, Patient Position: Sitting, Cuff Size: Large)   Pulse 93   Temp 97.9 F (36.6 C) (Oral)   Resp 20   Wt 151 lb (68.5 kg)   SpO2 96%   BMI 25.92 kg/m     Allergies  Allergen Reactions  . Other Anaphylaxis  . Tetracyclines & Related Anaphylaxis  . Tetracycline     REACTION: arms swelling, itching, blisters    Past Medical History:  Diagnosis Date  . Anemia   . Cancer ([place])    SCC  . Carotid artery occlusion   . COPD (chronic obstructive pulmonary disease) (HCC)    wears oxygen at 3L prn daytime and 3L at night   . DDD (degenerative disc disease)   . Diabetes mellitus (HCC)    fasting blood sugar 80-100  . Diastolic dysfunction   . Dizziness   . Glaucoma   . Headache(784.0)    migranes  . Hearing loss of both ears    wears bilateral hearing aids  . Hemorrhoids   . Holter monitor, abnormal    wearing for 21 days  . Hyperlipidemia   . Hypothyroid   . Inflammatory polyps of colon with rectal bleeding ([place])   . Irregular heart beat   . Myocardial infarction ([place]) 2006  . Pneumonia    hx  . Retinopathy of left eye   .  Shortness of breath    most of time  . Syncope     Past Surgical History:  Procedure Laterality Date  . ABDOMINAL HYSTERECTOMY    . CAROTID ENDARTERECTOMY    . COLONOSCOPY W/ POLYPECTOMY    . ENDARTERECTOMY  09/01/2012   Procedure: ENDARTERECTOMY CAROTID;  Surgeon: Conrad Glasgow, MD;  Location: [facility];  Service: Vascular;  Laterality: Right;  . EYE SURGERY     cataracts  . HEMORRHOID SURGERY    . PATCH ANGIOPLASTY  09/01/2012   Procedure: PATCH ANGIOPLASTY;  Surgeon: Conrad Alton, MD;  Location: [place];  Service: Vascular;  Laterality: Right;  Vascu-Guard Patch Angioplasty  . squamous cell removed    . THROAT SURGERY    . TONSILLECTOMY      Social History   Socioeconomic History  . Marital status: Widowed    Spouse name: Not on file  . Number of children: 4  . Years of education: Not on file  . Highest education level: Not on file  Social Needs  .  Financial resource strain: Not on file  . Food insecurity - worry: Not on file  . Food insecurity - inability: Not on file  . Transportation needs - medical: Not on file  . Transportation needs - non-medical: Not on file  Occupational History    Employer: RETIRED  Tobacco Use  . Smoking status: Former Smoker    Types: Cigarettes    Last attempt to quit: 09/17/1987    Years since quitting: 30.1  . Smokeless tobacco: Never Used  Substance and Sexual Activity  . Alcohol use: No  . Drug use: No  . Sexual activity: Not on file  Other Topics Concern  . Not on file  Social History Narrative  . Not on file    Family History  Problem Relation Age of Onset  . Depression Mother   . Diabetes Mother   . CAD Mother   . Heart disease Mother   . Hyperlipidemia Daughter   . CAD Father   . Heart disease Father   . Diabetes Brother   . Hypertension Son   . Breast cancer Daughter     Outpatient Encounter Medications as of 10/07/2017  Medication Sig  . acetaminophen (TYLENOL ARTHRITIS PAIN) 650 MG CR tablet Take 650 mg by mouth  every 8 (eight) hours as needed. For pain  . alendronate (FOSAMAX) 70 MG tablet TAKE ONE TABLET ONCE A WEEK  . AMBULATORY NON FORMULARY MEDICATION Medication Name: glucometer strips to test BID.  Dx. 250.00  . AMBULATORY NON FORMULARY MEDICATION Medication Name: Portable oxygen - 2 liters. Dx. COPD.  Send to [place].  [name] artificial tears (LACRILUBE) OINT ophthalmic ointment Place into both eyes every 3 (three) hours as needed for dry eyes.  [name] aspirin 81 MG tablet Take 81 mg by mouth daily.    . Calcium-Vitamin D (CALTRATE 600 PLUS-VIT D PO) Take 1 tablet by mouth daily.   . fluticasone furoate-vilanterol (BREO ELLIPTA) 200-25 MCG/INH AEPB Inhale 1 puff into the lungs daily. (Patient not taking: Reported on 10/10/2017)  . HYDROcodone-acetaminophen (NORCO/VICODIN) 5-325 MG tablet Take 1 tablet every 8 (eight) hours as needed by mouth for moderate pain. (Patient not taking: Reported on 10/10/2017)  . Insulin Glargine (LANTUS SOLOSTAR) 100 UNIT/ML Solostar Pen Inject 12 Units at bedtime into the skin.  [name] loratadine (CLARITIN) 10 MG tablet TAKE 1 TABLET BY MOUTH ONCE DAILY  . Multiple Vitamin (MULTIVITAMIN) tablet Take 1 tablet by mouth daily.    . naproxen (NAPROSYN) 500 MG tablet TAKE ONE TABLET BY MOUTH 2 TIMES A DAY WITH A MEAL AS NEEDED FOR JAW AND EAR PAIN  . nitroGLYCERIN (NITROSTAT) 0.4 MG SL tablet Place 1 tablet (0.4 mg total) under the tongue every 5 (five) minutes as needed. For chest pain  . olopatadine (PATANOL) 0.1 % ophthalmic solution PLACE ONE DROP INTO BOTH EYES 2 TIMES A DAY  . QC LANCETS SUPER THIN MISC TEST TWICE DAILY  . sertraline (ZOLOFT) 100 MG tablet TAKE 1 & 1/2 TABLETS BY MOUTH EVERY DAY  . simvastatin (ZOCOR) 20 MG tablet Take 1 tablet (20 mg total) by mouth daily at 6 PM.  . SYNTHROID 75 MCG tablet TAKE ONE TABLET BY MOUTH EVERY DAY BEFORE BREAKFAST (Patient taking differently: 1 tablet daily except for 1.5 tablet on Sunday.)  . traMADol (ULTRAM) 50 MG tablet TAKE ONE TABLET  BY MOUTH DAILY AS NEEDED  . TRUETRACK TEST test strip TEST 2 TIMES A DAY AS DIRECTED  . UNIFINE PENTIPS 29G X 12MM MISC USE ONCE  DAILY AS DIRECTED  . VENTOLIN HFA 108 (90 Base) MCG/ACT inhaler INHALE 2 PUFFS INTO THE LUNGS EVERY 4 HOURS AS NEEDED FOR WHEEZING  . [DISCONTINUED] umeclidinium bromide (INCRUSE ELLIPTA) 62.5 MCG/INH AEPB Inhale 1 puff into the lungs daily.  [name] albuterol (ACCUNEB) 1.25 MG/3ML nebulizer solution Take 3 mLs (1.25 mg total) by nebulization every 6 (six) hours as needed for wheezing. Please fax to [facility].  . AMBULATORY NON FORMULARY MEDICATION Medication Name: Nebulizer for albuterol. Dx. Severe COPD.  Please see separate Rx for albuterol soln.  . Fluticasone-Umeclidin-Vilant (TRELEGY ELLIPTA) 100-62.5-25 MCG/INH AEPB Inhale 1 puff into the lungs daily.  . [EXPIRED] albuterol (PROVENTIL) (2.5 MG/3ML) 0.083% nebulizer solution 2.5 mg    No facility-administered encounter medications on file as of 10/07/2017.          Objective:   Physical Exam  Constitutional: She is oriented to person, place, and time. She appears well-developed and well-nourished.  HENT:  Head: Normocephalic and atraumatic.  Cardiovascular: Normal rate, regular rhythm and normal heart sounds.  Pulmonary/Chest: Effort normal and breath sounds normal.  Neurological: She is alert and oriented to person, place, and time.  Skin: Skin is warm and dry.  Psychiatric: She has a normal mood and affect. Her behavior is normal.           Assessment & Plan:  The COPD-pulse ox  at baseline is 96%.  FVC of 47% with FEV1 of 32% and a ratio of 51%.  She had significant improvement after albuterol in the FVC.  But not FEV1.  From history is consistent with severe COPD.  I am going to discontinue her Incruse and switch her to trilogy.  We will also get a prescription sent over to [place] for a home nebulizer so that she can use her albuterol when needed.  She is primarily homebound as she no longer drives.  Will be  to follow-up in 6 weeks to see how she is doing with the new medication and then I would eventually like to retest her with spirometry in about 6 months in July.  Chronic hypoxic respiratory failure secondary to COPD-at this point I think she would be a good candidate for in NIV therapy.  BiPAP was considered but ruled out first.  Prescribing NIV therapy will help reduce PCO2 levels and prevent breath stacking and air trapping.  Placed order.  DM -discussed with her that if her sugar is very high then she needs to call our office immediately.  Ultimately we want her blood sugars in the morning under 130.  She says yesterday it was 121.  If she is noticing multiple days where her sugar is greater than 130 then she is to give [place] a call so that we can adjust her dose on her insulin.  Also reminded her that days worth elevated is most likely directly related to what she ate for dinner the night before.

## 2017-10-07 NOTE — Assessment & Plan Note (Signed)
Never did physical therapy and never called [place]. Placing a referral to a home health PT agency. Patient is essentially homebound. Left glenohumeral joint injection as above. Return in a month.

## 2017-10-07 NOTE — Progress Notes (Signed)
Subjective:    CC: Left shoulder pain  HPI: This is a pleasant 84 year old female, we treated her for left glenohumeral osteoarthritis at the last visit.  We recommended physical therapy which has not been done.  She is having persistence of pain not surprisingly, moderate, persistent, localized to the joint line, no radiation.  No trauma, no constitutional symptoms.  I reviewed the past medical history, family history, social history, surgical history, and allergies today and no changes were needed.  Please see the problem list section below in epic for further details.  Past Medical History: Past Medical History:  Diagnosis Date  . Anemia   . Cancer ([place])    SCC  . Carotid artery occlusion   . COPD (chronic obstructive pulmonary disease) (HCC)    wears oxygen at 3L prn daytime and 3L at night   . DDD (degenerative disc disease)   . Diabetes mellitus (HCC)    fasting blood sugar 80-100  . Diastolic dysfunction   . Dizziness   . Glaucoma   . Headache(784.0)    migranes  . Hearing loss of both ears    wears bilateral hearing aids  . Hemorrhoids   . Holter monitor, abnormal    wearing for 21 days  . Hyperlipidemia   . Hypothyroid   . Inflammatory polyps of colon with rectal bleeding ([place])   . Irregular heart beat   . Myocardial infarction ([place]) 2006  . Pneumonia    hx  . Retinopathy of left eye   . Shortness of breath    most of time  . Syncope    Past Surgical History: Past Surgical History:  Procedure Laterality Date  . ABDOMINAL HYSTERECTOMY    . CAROTID ENDARTERECTOMY    . COLONOSCOPY W/ POLYPECTOMY    . ENDARTERECTOMY  09/01/2012   Procedure: ENDARTERECTOMY CAROTID;  Surgeon: Conrad Colfax, MD;  Location: [facility];  Service: Vascular;  Laterality: Right;  . EYE SURGERY     cataracts  . HEMORRHOID SURGERY    . PATCH ANGIOPLASTY  09/01/2012   Procedure: PATCH ANGIOPLASTY;  Surgeon: Conrad Weeki [place], MD;  Location: [facility];  Service: Vascular;  Laterality: Right;   Vascu-Guard Patch Angioplasty  . squamous cell removed    . THROAT SURGERY    . TONSILLECTOMY     Social History: Social History   Socioeconomic History  . Marital status: Widowed    Spouse name: None  . Number of children: 4  . Years of education: None  . Highest education level: None  Social Needs  . Financial resource strain: None  . Food insecurity - worry: None  . Food insecurity - inability: None  . Transportation needs - medical: None  . Transportation needs - non-medical: None  Occupational History    Employer: RETIRED  Tobacco Use  . Smoking status: Former Smoker    Types: Cigarettes    Last attempt to quit: 09/17/1987    Years since quitting: 30.0  . Smokeless tobacco: Never Used  Substance and Sexual Activity  . Alcohol use: No  . Drug use: No  . Sexual activity: None  Other Topics Concern  . None  Social History Narrative  . None   Family History: Family History  Problem Relation Age of Onset  . Depression Mother   . Diabetes Mother   . CAD Mother   . Heart disease Mother   . Hyperlipidemia Daughter   . CAD Father   . Heart disease Father   .  Diabetes Brother   . Hypertension Son   . Breast cancer Daughter    Allergies: Allergies  Allergen Reactions  . Other Anaphylaxis  . Tetracyclines & Related Anaphylaxis  . Tetracycline     REACTION: arms swelling, itching, blisters   Medications: See med rec.  Review of Systems: No fevers, chills, night sweats, weight loss, chest pain, or shortness of breath.   Objective:    General: Well Developed, well nourished, and in no acute distress.  Neuro: Alert and oriented x3, extra-ocular muscles intact, sensation grossly intact.  HEENT: Normocephalic, atraumatic, pupils equal round reactive to light, neck supple, no masses, no lymphadenopathy, thyroid nonpalpable.  Skin: Warm and dry, no rashes. Cardiac: Regular rate and rhythm, no murmurs rubs or gallops, no lower extremity edema.  Respiratory: Clear  to auscultation bilaterally. Not using accessory muscles, speaking in full sentences. Left shoulder: Inspection reveals no abnormalities, atrophy or asymmetry. Palpation is normal with no tenderness over AC joint or bicipital groove. ROM is full in all planes. Rotator cuff strength normal throughout. No signs of impingement with negative Neer and Hawkin's tests, empty can. Speeds and Yergason's tests normal. Pain with abduction and external rotation consistent with glenohumeral mediated pain Normal scapular function observed. No painful arc and no drop arm sign. No apprehension sign  Procedure: Real-time Ultrasound Guided Injection of left glenohumeral joint Device: GE Logiq E  Verbal informed consent obtained.  Time-out conducted.  Noted no overlying erythema, induration, or other signs of local infection.  Skin prepped in a sterile fashion.  Local anesthesia: Topical Ethyl chloride.  With sterile technique and under real time ultrasound guidance: Advanced a 22-gauge spinal needle into the joint from a posterior approach and injected 1 cc kenalog 40, 2 cc lidocaine, 2 cc bupivacaine. Completed without difficulty  Pain immediately resolved suggesting accurate placement of the medication.  Advised to call if fevers/chills, erythema, induration, drainage, or persistent bleeding.  Images permanently stored and available for review in the ultrasound unit.  Impression: Technically successful ultrasound guided injection.  Impression and Recommendations:    Primary osteoarthritis of left shoulder Never did physical therapy and never called [place]. Placing a referral to a home health PT agency. Patient is essentially homebound. Left glenohumeral joint injection as above. Return in a month. ___________________________________________ Gwen Her. Dianah Field, M.D., ABFM., CAQSM. Primary Care and [place] Instructor of [place] of [facility] [employer]

## 2017-10-07 NOTE — Patient Instructions (Signed)
Stop the Incruse and start Trelegy once a day.

## 2017-10-08 DIAGNOSIS — Q348 Other specified congenital malformations of respiratory system: Secondary | ICD-10-CM | POA: Diagnosis not present

## 2017-10-08 DIAGNOSIS — J45909 Unspecified asthma, uncomplicated: Secondary | ICD-10-CM | POA: Diagnosis not present

## 2017-10-09 ENCOUNTER — Telehealth: Payer: Self-pay | Admitting: Family Medicine

## 2017-10-09 DIAGNOSIS — E1122 Type 2 diabetes mellitus with diabetic chronic kidney disease: Secondary | ICD-10-CM | POA: Diagnosis not present

## 2017-10-09 DIAGNOSIS — N183 Chronic kidney disease, stage 3 (moderate): Secondary | ICD-10-CM | POA: Diagnosis not present

## 2017-10-09 DIAGNOSIS — D631 Anemia in chronic kidney disease: Secondary | ICD-10-CM | POA: Diagnosis not present

## 2017-10-09 DIAGNOSIS — M19012 Primary osteoarthritis, left shoulder: Secondary | ICD-10-CM | POA: Diagnosis not present

## 2017-10-09 DIAGNOSIS — E11319 Type 2 diabetes mellitus with unspecified diabetic retinopathy without macular edema: Secondary | ICD-10-CM | POA: Diagnosis not present

## 2017-10-09 DIAGNOSIS — F329 Major depressive disorder, single episode, unspecified: Secondary | ICD-10-CM | POA: Diagnosis not present

## 2017-10-09 DIAGNOSIS — M810 Age-related osteoporosis without current pathological fracture: Secondary | ICD-10-CM | POA: Diagnosis not present

## 2017-10-09 DIAGNOSIS — J439 Emphysema, unspecified: Secondary | ICD-10-CM | POA: Diagnosis not present

## 2017-10-09 DIAGNOSIS — M519 Unspecified thoracic, thoracolumbar and lumbosacral intervertebral disc disorder: Secondary | ICD-10-CM | POA: Diagnosis not present

## 2017-10-09 NOTE — Telephone Encounter (Signed)
Jessica from [facility] called to get a verbal for: Nursing - 1x a week, 9 weeks PT- Evaluation  Verbal order given.

## 2017-10-09 NOTE — Telephone Encounter (Signed)
Thank you :)

## 2017-10-10 ENCOUNTER — Other Ambulatory Visit: Payer: Self-pay

## 2017-10-10 NOTE — Patient Outreach (Signed)
[facility]) Care Management  [place]  10/10/2017   Theresa Huang 1931-07-03 [id number]  Subjective: Telephone call placed to the patient. HIPAA verified. Spoke with the caregiver [name].  She states that the patient is doing fair.  She has had some dizziness.  The patient states that she is having some pain today a 6/10 .  She takes tramadol for her pain.  She denies any falls. The patient was seen by her physician last week.  She had a medication change to trilogy.  The patient's nebulizer is not working and her physician has sent a prescription to [place]. The caregiver states that she has not received the nebulizer yet. RN Health Coach advised the caregiver to call and follow up with [facility]. Caregiver verbalized understanding. Caregiver states that the patient is eating well.  The patient is doing some walking with her walker but limited because she will become short of breath.  Objective:   Encounter Medications:  Outpatient Encounter Medications as of 10/10/2017  Medication Sig Note  . acetaminophen (TYLENOL ARTHRITIS PAIN) 650 MG CR tablet Take 650 mg by mouth every 8 (eight) hours as needed. For pain   . albuterol (ACCUNEB) 1.25 MG/3ML nebulizer solution Take 3 mLs (1.25 mg total) by nebulization every 6 (six) hours as needed for wheezing. Please fax to [facility].   [name] alendronate (FOSAMAX) 70 MG tablet TAKE ONE TABLET ONCE A WEEK   . AMBULATORY NON FORMULARY MEDICATION Medication Name: glucometer strips to test BID.  Dx. 250.00   . AMBULATORY NON FORMULARY MEDICATION Medication Name: Portable oxygen - 2 liters. Dx. COPD.  Send to [place].   . AMBULATORY NON FORMULARY MEDICATION Medication Name: Nebulizer for albuterol. Dx. Severe COPD.  Please see separate Rx for albuterol soln.   [name] artificial tears (LACRILUBE) OINT ophthalmic ointment Place into both eyes every 3 (three) hours as needed for dry eyes. 07/24/2017: She has not used it in a while  . aspirin 81 MG tablet  Take 81 mg by mouth daily.     . Calcium-Vitamin D (CALTRATE 600 PLUS-VIT D PO) Take 1 tablet by mouth daily.    . Fluticasone-Umeclidin-Vilant (TRELEGY ELLIPTA) 100-62.5-25 MCG/INH AEPB Inhale 1 puff into the lungs daily.   . Insulin Glargine (LANTUS SOLOSTAR) 100 UNIT/ML Solostar Pen Inject 12 Units at bedtime into the skin.   [name] loratadine (CLARITIN) 10 MG tablet TAKE 1 TABLET BY MOUTH ONCE DAILY   . Multiple Vitamin (MULTIVITAMIN) tablet Take 1 tablet by mouth daily.     . naproxen (NAPROSYN) 500 MG tablet TAKE ONE TABLET BY MOUTH 2 TIMES A DAY WITH A MEAL AS NEEDED FOR JAW AND EAR PAIN   . nitroGLYCERIN (NITROSTAT) 0.4 MG SL tablet Place 1 tablet (0.4 mg total) under the tongue every 5 (five) minutes as needed. For chest pain   . olopatadine (PATANOL) 0.1 % ophthalmic solution PLACE ONE DROP INTO BOTH EYES 2 TIMES A DAY   . QC LANCETS SUPER THIN MISC TEST TWICE DAILY   . sertraline (ZOLOFT) 100 MG tablet TAKE 1 & 1/2 TABLETS BY MOUTH EVERY DAY   . simvastatin (ZOCOR) 20 MG tablet Take 1 tablet (20 mg total) by mouth daily at 6 PM.   . SYNTHROID 75 MCG tablet TAKE ONE TABLET BY MOUTH EVERY DAY BEFORE BREAKFAST (Patient taking differently: 1 tablet daily except for 1.5 tablet on Sunday.)   . traMADol (ULTRAM) 50 MG tablet TAKE ONE TABLET BY MOUTH DAILY AS NEEDED   .  TRUETRACK TEST test strip TEST 2 TIMES A DAY AS DIRECTED   . UNIFINE PENTIPS 29G X 12MM MISC USE ONCE DAILY AS DIRECTED   . VENTOLIN HFA 108 (90 Base) MCG/ACT inhaler INHALE 2 PUFFS INTO THE LUNGS EVERY 4 HOURS AS NEEDED FOR WHEEZING   . fluticasone furoate-vilanterol (BREO ELLIPTA) 200-25 MCG/INH AEPB Inhale 1 puff into the lungs daily. (Patient not taking: Reported on 10/10/2017)   . HYDROcodone-acetaminophen (NORCO/VICODIN) 5-325 MG tablet Take 1 tablet every 8 (eight) hours as needed by mouth for moderate pain. (Patient not taking: Reported on 10/10/2017)    No facility-administered encounter medications on file as of 10/10/2017.      Functional Status:  In your present state of health, do you have any difficulty performing the following activities: 07/24/2017  Hearing? Y  Vision? Y  Comment glassess  Difficulty concentrating or making decisions? Y  Comment sometimes  Walking or climbing stairs? Y  Dressing or bathing? N  Doing errands, shopping? Y  Some recent data might be hidden    Fall/Depression Screening: Fall Risk  10/10/2017 07/24/2017 07/16/2017  Falls in the past year? No Yes Yes  Number falls in past yr: - 2 or more 2 or more  Injury with Fall? - Yes No  Comment - She broke her foot 6-8 months ago -  Risk Factor Category  - High Fall Risk High Fall Risk  Risk for fall due to : - History of fall(s);Impaired balance/gait History of fall(s);Impaired mobility  Follow up - Falls evaluation completed Falls evaluation completed   PHQ 2/9 Scores 07/24/2017 07/22/2017 07/16/2017 01/20/2017 12/10/2016 12/25/2015 07/12/2014  PHQ - 2 Score 2 4 - 0 0 0 0  PHQ- 9 Score 8 12 - - - - -  Exception Documentation - - Other- indicate reason in comment box - - - -  Not completed - - Daughter answered screening questions on patient's behalf.  - - - -    Assessment: Patient continues to benefit from health coach outreach for disease management and support.    THN CM Care Plan Problem One     Most Recent Value  Care Plan Problem One  Knowledge deficit  Role Documenting the Problem One  Health Coach  Care Plan for Problem One  Active  THN Long Term Goal   In 90 days the patient and caregiver will learn the copd action plan  THN Long Term Goal Start Date  07/24/17  Interventions for Problem One Long Term Goal  RN health Coach discussed copd action plan  THN CM Short Term Goal #1   IN 30 days the caregiver will verbalize no falls since lasst conversation  THN CM Short Term Goal #1 Start Date  07/24/17  Interventions for Short Term Goal #1  RN Health Coach will send EMMI concerning falls  THN CM Short Term Goal #2   In 30  days the caregiver will verbalize the patient is maintaing weight   THN CM Short Term Goal #2 Start Date  07/24/17  Interventions for Short Term Goal #2  RN Health Coach dicussed diet and will send information about diet.      Plan: RN Health Coach will contact patient in the month of February and patient agrees to next outreach.  Lazaro Arms RN, BSN, [place] Direct Dial:  [phone] Fax: [phone]

## 2017-10-10 NOTE — Patient Outreach (Signed)
[facility]) Care Management  10/10/2017  Theresa Huang November 23, 1930 [id number]

## 2017-10-12 DIAGNOSIS — F329 Major depressive disorder, single episode, unspecified: Secondary | ICD-10-CM | POA: Diagnosis not present

## 2017-10-12 DIAGNOSIS — E1122 Type 2 diabetes mellitus with diabetic chronic kidney disease: Secondary | ICD-10-CM | POA: Diagnosis not present

## 2017-10-12 DIAGNOSIS — M810 Age-related osteoporosis without current pathological fracture: Secondary | ICD-10-CM | POA: Diagnosis not present

## 2017-10-12 DIAGNOSIS — M19012 Primary osteoarthritis, left shoulder: Secondary | ICD-10-CM | POA: Diagnosis not present

## 2017-10-12 DIAGNOSIS — J439 Emphysema, unspecified: Secondary | ICD-10-CM | POA: Diagnosis not present

## 2017-10-12 DIAGNOSIS — M519 Unspecified thoracic, thoracolumbar and lumbosacral intervertebral disc disorder: Secondary | ICD-10-CM | POA: Diagnosis not present

## 2017-10-12 DIAGNOSIS — E11319 Type 2 diabetes mellitus with unspecified diabetic retinopathy without macular edema: Secondary | ICD-10-CM | POA: Diagnosis not present

## 2017-10-12 DIAGNOSIS — N183 Chronic kidney disease, stage 3 (moderate): Secondary | ICD-10-CM | POA: Diagnosis not present

## 2017-10-12 DIAGNOSIS — D631 Anemia in chronic kidney disease: Secondary | ICD-10-CM | POA: Diagnosis not present

## 2017-10-15 DIAGNOSIS — J9611 Chronic respiratory failure with hypoxia: Secondary | ICD-10-CM | POA: Diagnosis not present

## 2017-10-15 DIAGNOSIS — J449 Chronic obstructive pulmonary disease, unspecified: Secondary | ICD-10-CM | POA: Diagnosis not present

## 2017-10-17 DIAGNOSIS — J9611 Chronic respiratory failure with hypoxia: Secondary | ICD-10-CM | POA: Insufficient documentation

## 2017-10-17 DIAGNOSIS — J449 Chronic obstructive pulmonary disease, unspecified: Secondary | ICD-10-CM | POA: Diagnosis not present

## 2017-10-29 DIAGNOSIS — J449 Chronic obstructive pulmonary disease, unspecified: Secondary | ICD-10-CM | POA: Diagnosis not present

## 2017-10-30 DIAGNOSIS — E119 Type 2 diabetes mellitus without complications: Secondary | ICD-10-CM | POA: Diagnosis not present

## 2017-10-30 DIAGNOSIS — N3941 Urge incontinence: Secondary | ICD-10-CM | POA: Diagnosis not present

## 2017-11-04 ENCOUNTER — Encounter: Payer: Self-pay | Admitting: Sports Medicine

## 2017-11-04 ENCOUNTER — Ambulatory Visit (INDEPENDENT_AMBULATORY_CARE_PROVIDER_SITE_OTHER): Payer: Medicare HMO | Admitting: Sports Medicine

## 2017-11-04 DIAGNOSIS — M19012 Primary osteoarthritis, left shoulder: Secondary | ICD-10-CM

## 2017-11-04 MED ORDER — TRAMADOL HCL 50 MG PO TABS: 100.0000 mg | ORAL_TABLET | Freq: Three times a day (TID) | ORAL | 1 refills | Status: DC | PRN

## 2017-11-04 NOTE — Progress Notes (Signed)
Subjective:    CC: Follow-up  HPI: Left glenohumeral osteoarthritis: A few weeks of improvement after the glenohumeral injection at the last visit, started to have a recurrence of pain, she is finished with home health physical therapy with moderate relief.  Currently doing about 4 tramadol per day.  I reviewed the past medical history, family history, social history, surgical history, and allergies today and no changes were needed.  Please see the problem list section below in epic for further details.  Past Medical History: Past Medical History:  Diagnosis Date  . Anemia   . Cancer ([place])    SCC  . Carotid artery occlusion   . COPD (chronic obstructive pulmonary disease) (HCC)    wears oxygen at 3L prn daytime and 3L at night   . DDD (degenerative disc disease)   . Diabetes mellitus (HCC)    fasting blood sugar 80-100  . Diastolic dysfunction   . Dizziness   . Glaucoma   . Headache(784.0)    migranes  . Hearing loss of both ears    wears bilateral hearing aids  . Hemorrhoids   . Holter monitor, abnormal    wearing for 21 days  . Hyperlipidemia   . Hypothyroid   . Inflammatory polyps of colon with rectal bleeding ([place])   . Irregular heart beat   . Myocardial infarction ([place]) 2006  . Pneumonia    hx  . Retinopathy of left eye   . Shortness of breath    most of time  . Syncope    Past Surgical History: Past Surgical History:  Procedure Laterality Date  . ABDOMINAL HYSTERECTOMY    . CAROTID ENDARTERECTOMY    . COLONOSCOPY W/ POLYPECTOMY    . ENDARTERECTOMY  09/01/2012   Procedure: ENDARTERECTOMY CAROTID;  Surgeon: Conrad Ray, MD;  Location: [facility];  Service: Vascular;  Laterality: Right;  . EYE SURGERY     cataracts  . HEMORRHOID SURGERY    . PATCH ANGIOPLASTY  09/01/2012   Procedure: PATCH ANGIOPLASTY;  Surgeon: Conrad , MD;  Location: [facility];  Service: Vascular;  Laterality: Right;  Vascu-Guard Patch Angioplasty  . squamous cell removed    . THROAT  SURGERY    . TONSILLECTOMY     Social History: Social History   Socioeconomic History  . Marital status: Widowed    Spouse name: None  . Number of children: 4  . Years of education: None  . Highest education level: None  Social Needs  . Financial resource strain: None  . Food insecurity - worry: None  . Food insecurity - inability: None  . Transportation needs - medical: None  . Transportation needs - non-medical: None  Occupational History    Employer: RETIRED  Tobacco Use  . Smoking status: Former Smoker    Types: Cigarettes    Last attempt to quit: 09/17/1987    Years since quitting: 30.1  . Smokeless tobacco: Never Used  Substance and Sexual Activity  . Alcohol use: No  . Drug use: No  . Sexual activity: None  Other Topics Concern  . None  Social History Narrative  . None   Family History: Family History  Problem Relation Age of Onset  . Depression Mother   . Diabetes Mother   . CAD Mother   . Heart disease Mother   . Hyperlipidemia Daughter   . CAD Father   . Heart disease Father   . Diabetes Brother   . Hypertension Son   . Breast  cancer Daughter    Allergies: Allergies  Allergen Reactions  . Other Anaphylaxis  . Tetracyclines & Related Anaphylaxis  . Tetracycline     REACTION: arms swelling, itching, blisters   Medications: See med rec.  Review of Systems: No fevers, chills, night sweats, weight loss, chest pain, or shortness of breath.   Objective:    General: Well Developed, well nourished, and in no acute distress.  Neuro: Alert and oriented x3, extra-ocular muscles intact, sensation grossly intact.  HEENT: Normocephalic, atraumatic, pupils equal round reactive to light, neck supple, no masses, no lymphadenopathy, thyroid nonpalpable.  Skin: Warm and dry, no rashes. Cardiac: Regular rate and rhythm, no murmurs rubs or gallops, no lower extremity edema.  Respiratory: Clear to auscultation bilaterally. Not using accessory muscles, speaking in  full sentences. Left shoulder: Good range of motion, able to reach her scapula with internal rotation, and the top of her head with external rotation.  Impression and Recommendations:    Primary osteoarthritis of left shoulder He did some home health physical therapy, moderate improvement. Injection provided several weeks of relief. At this point she is going to stop home health therapy, we are going to increase her tramadol to 6 total pills per day. Return to see me in 2 months.  I spent 25 minutes with this patient, greater than 50% was face-to-face time counseling regarding the above diagnoses ___________________________________________ Gwen Her. Dianah Field, M.D., ABFM., CAQSM. Primary Care and [place] Instructor of [place] of [facility] [employer] Medicine

## 2017-11-04 NOTE — Assessment & Plan Note (Signed)
He did some home health physical therapy, moderate improvement. Injection provided several weeks of relief. At this point she is going to stop home health therapy, we are going to increase her tramadol to 6 total pills per day. Return to see me in 2 months.

## 2017-11-06 IMAGING — DX DG FOOT COMPLETE 3+V*R*
3 series · 3 of 3 positions shown · non-contrast
Comparison: Right foot series of [DATE]

CLINICAL DATA: Status post fall on [REDACTED] with persistent pain
over the tarsals and proximal metatarsals associated with swelling
and bruising throughout the foot.

EXAM:
RIGHT FOOT COMPLETE - 3+ VIEW

[foot ap]
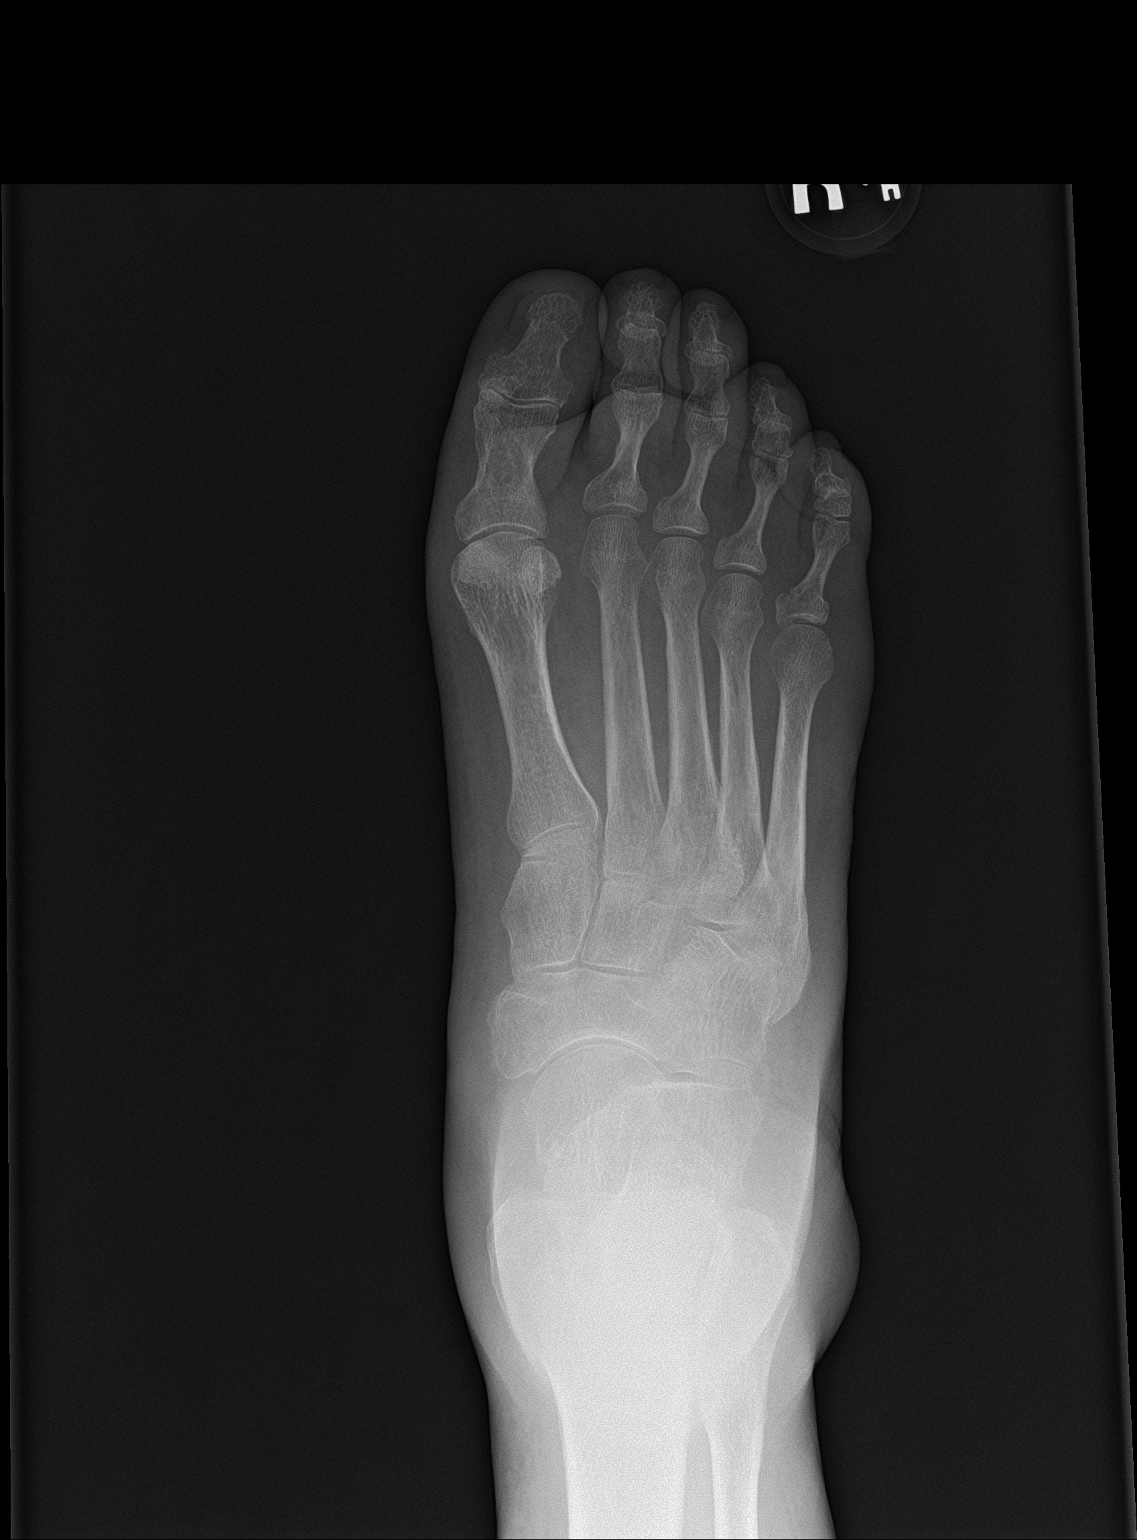

[foot obl]
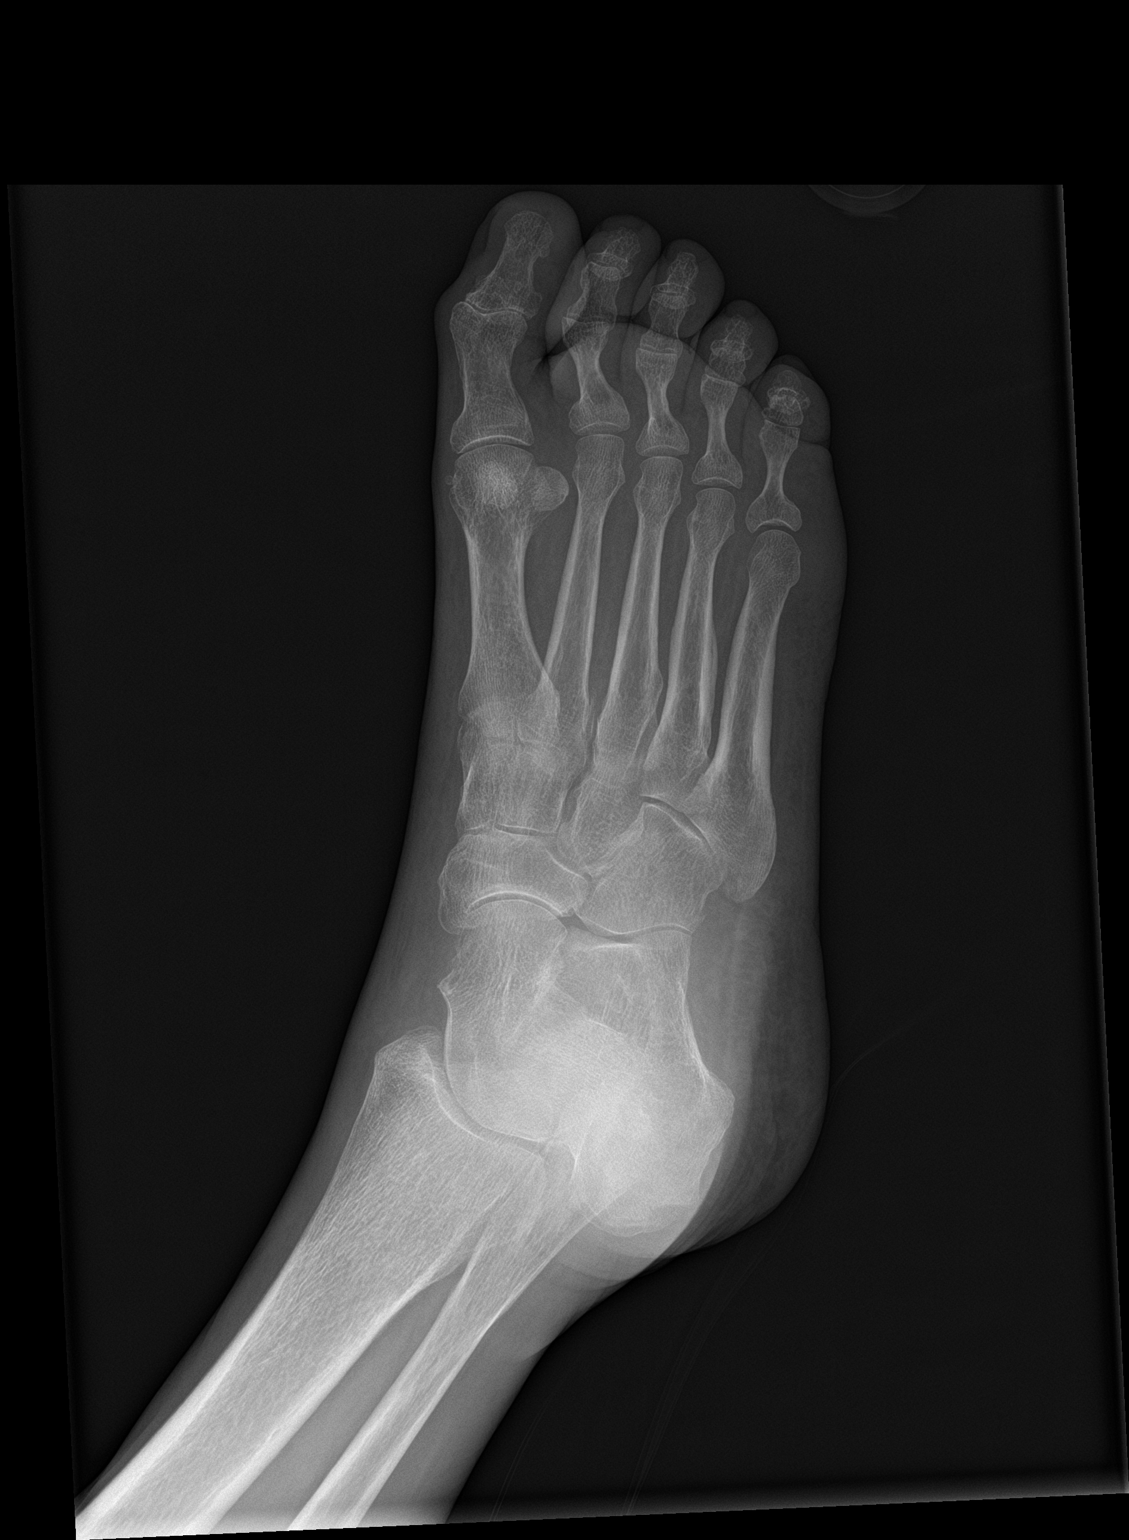

[foot lat]
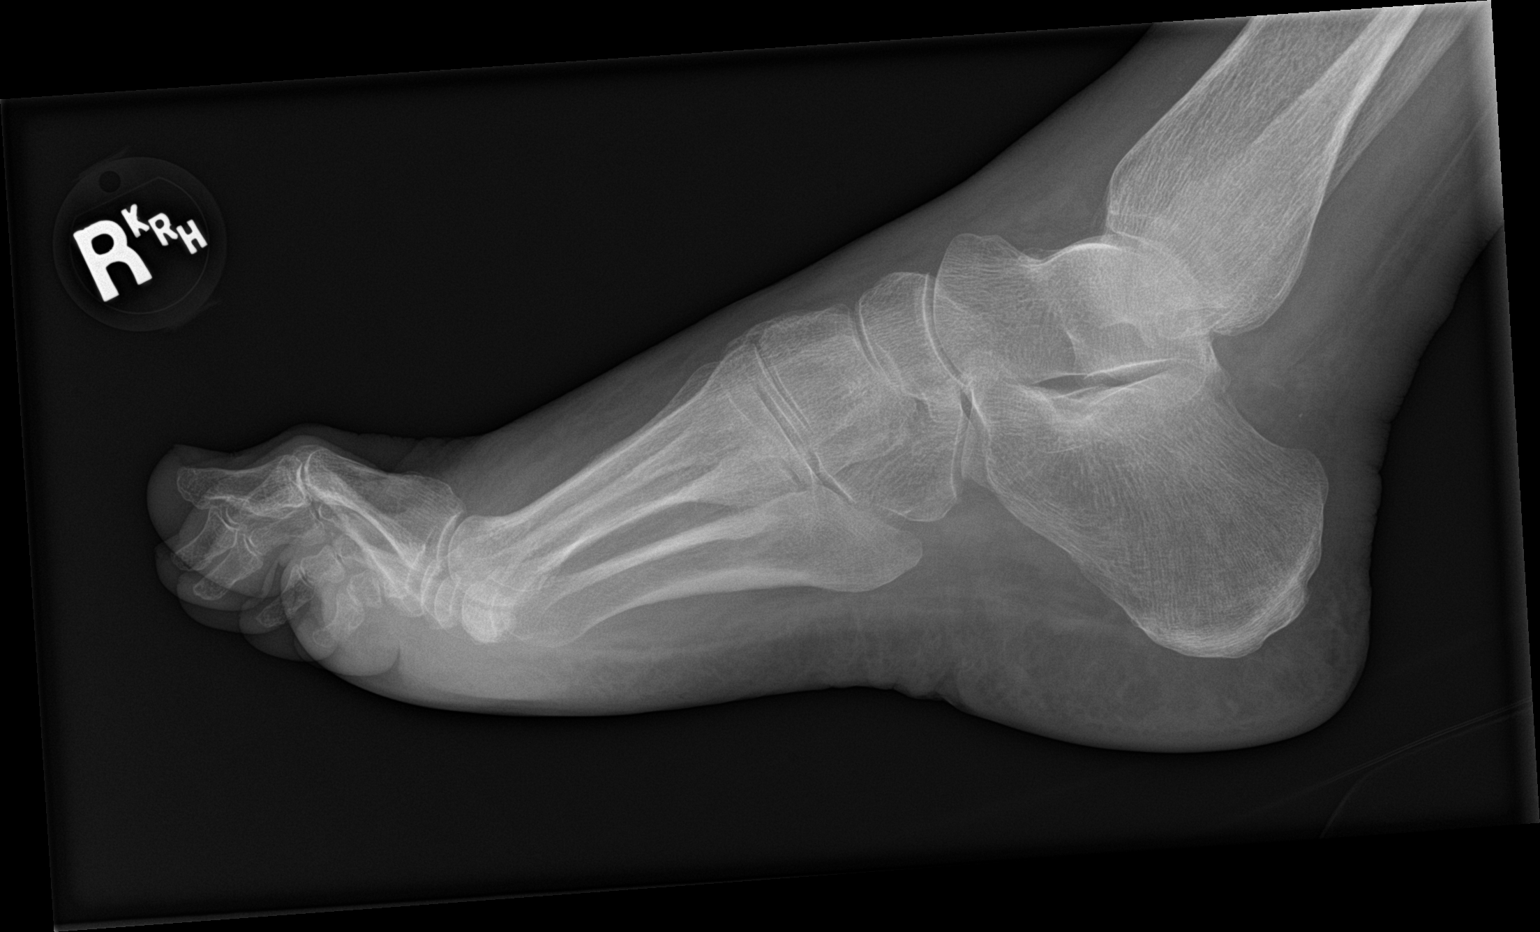

[3 of 3 positions shown; findings below may reference images not displayed]

FINDINGS: The bones are subjectively osteopenic. No acute displaced or
nondisplaced fracture is observed. There is no lytic or blastic
lesion or periosteal reaction. There is mild narrowing of the
interphalangeal joints and of the first MTP joint space. Very mild
hallux valgus contour of the first ray is noted but is less
conspicuous than on the previous study.
IMPRESSION: There is no acute or healing fracture of the right foot. If the
patient's symptoms persist, MRI would be a useful next imaging step.

## 2017-11-08 DIAGNOSIS — J45909 Unspecified asthma, uncomplicated: Secondary | ICD-10-CM | POA: Diagnosis not present

## 2017-11-08 DIAGNOSIS — Q348 Other specified congenital malformations of respiratory system: Secondary | ICD-10-CM | POA: Diagnosis not present

## 2017-11-12 ENCOUNTER — Other Ambulatory Visit: Payer: Self-pay | Admitting: Sports Medicine

## 2017-11-12 ENCOUNTER — Other Ambulatory Visit: Payer: Self-pay

## 2017-11-12 ENCOUNTER — Other Ambulatory Visit: Payer: Self-pay | Admitting: Family Medicine

## 2017-11-12 DIAGNOSIS — L723 Sebaceous cyst: Secondary | ICD-10-CM

## 2017-11-12 NOTE — Patient Outreach (Signed)
[place] [facility]) Care Management  11/12/2017  KYLEIGH NANNINI 1931/01/13 [id number]   1st  Outreach attempt to the patient for monthly assessment. No answer. HIPAA compliant voice mail left with contact information.  Plan: RN Health Coach will make an outreach attempt to the patient in one business day.  Lazaro Arms RN, BSN, [place] Direct Dial:  [phone] Fax: [phone]

## 2017-11-12 NOTE — Patient Outreach (Signed)
[facility]) Care Management  [place]  11/12/2017   Theresa Huang [birth year] [id number]  Subjective: Returned call from the Caregiver of the patient. HIPAA verified.Caregiver states that the patient is doing very well.  She has not had any problems with her breathing.  She states that the patient has some lightheadedness and dizziness.  She is only getting up when necessary but uses her walker when she does. RN Health Coach talked with the caregiver about fall prevention.  Caregiver verbalized understanding.  She states that she is having some pain in her back and shoulders.  She rates the pain at a 7/10.  She has medication to take for her pain but would like something stronger.  The caregiver has called her physician office and is waiting for a response.  She has an appointment with her primary care next Tuesday or Wednesday.    Encounter Medications:  Outpatient Encounter Medications as of 11/12/2017  Medication Sig Note  . acetaminophen (TYLENOL ARTHRITIS PAIN) 650 MG CR tablet Take 650 mg by mouth every 8 (eight) hours as needed. For pain   . albuterol (ACCUNEB) 1.25 MG/3ML nebulizer solution Take 3 mLs (1.25 mg total) by nebulization every 6 (six) hours as needed for wheezing. Please fax to [place].   [name] alendronate (FOSAMAX) 70 MG tablet TAKE ONE TABLET ONCE A WEEK   . artificial tears (LACRILUBE) OINT ophthalmic ointment Place into both eyes every 3 (three) hours as needed for dry eyes. 07/24/2017: She has not used it in a while  . aspirin 81 MG tablet Take 81 mg by mouth daily.     . Calcium-Vitamin D (CALTRATE 600 PLUS-VIT D PO) Take 1 tablet by mouth daily.    . fluticasone furoate-vilanterol (BREO ELLIPTA) 200-25 MCG/INH AEPB Inhale 1 puff into the lungs daily.   . Fluticasone-Umeclidin-Vilant (TRELEGY ELLIPTA) 100-62.5-25 MCG/INH AEPB Inhale 1 puff into the lungs daily.   . Insulin Glargine (LANTUS SOLOSTAR) 100 UNIT/ML Solostar Pen Inject 12 Units at  bedtime into the skin.   [name] loratadine (CLARITIN) 10 MG tablet TAKE 1 TABLET BY MOUTH ONCE DAILY   . Multiple Vitamin (MULTIVITAMIN) tablet Take 1 tablet by mouth daily.     . naproxen (NAPROSYN) 500 MG tablet TAKE ONE TABLET BY MOUTH 2 TIMES A DAY WITH A MEAL AS NEEDED FOR JAW AND EAR PAIN   . nitroGLYCERIN (NITROSTAT) 0.4 MG SL tablet Place 1 tablet (0.4 mg total) under the tongue every 5 (five) minutes as needed. For chest pain   . QC LANCETS SUPER THIN MISC TEST TWICE DAILY   . sertraline (ZOLOFT) 100 MG tablet TAKE 1 & 1/2 TABLETS BY MOUTH EVERY DAY   . simvastatin (ZOCOR) 20 MG tablet Take 1 tablet (20 mg total) by mouth daily at 6 PM.   . SYNTHROID 75 MCG tablet TAKE ONE TABLET BY MOUTH EVERY DAY BEFORE BREAKFAST (Patient taking differently: 1 tablet daily except for 1.5 tablet on Sunday.)   . traMADol (ULTRAM) 50 MG tablet Take 2 tablets (100 mg total) by mouth 3 (three) times daily as needed.   Angelia Mould TEST test strip TEST 2 TIMES A DAY AS DIRECTED   . UNIFINE PENTIPS 29G X 12MM MISC USE ONCE DAILY AS DIRECTED   . VENTOLIN HFA 108 (90 Base) MCG/ACT inhaler INHALE 2 PUFFS INTO THE LUNGS EVERY 4 HOURS AS NEEDED FOR WHEEZING   . AMBULATORY NON FORMULARY MEDICATION Medication Name: glucometer strips to test BID.  Dx. 250.00   .  AMBULATORY NON FORMULARY MEDICATION Medication Name: Portable oxygen - 2 liters. Dx. COPD.  Send to [place].   . AMBULATORY NON FORMULARY MEDICATION Medication Name: Nebulizer for albuterol. Dx. Severe COPD.  Please see separate Rx for albuterol soln.   [name] HYDROcodone-acetaminophen (NORCO/VICODIN) 5-325 MG tablet Take 1 tablet every 8 (eight) hours as needed by mouth for moderate pain. (Patient not taking: Reported on 10/10/2017)   . olopatadine (PATANOL) 0.1 % ophthalmic solution PLACE ONE DROP INTO BOTH EYES 2 TIMES A DAY (Patient not taking: Reported on 11/12/2017)    No facility-administered encounter medications on file as of 11/12/2017.     Functional Status:   In your present state of health, do you have any difficulty performing the following activities: 07/24/2017  Hearing? Y  Vision? Y  Comment glassess  Difficulty concentrating or making decisions? Y  Comment sometimes  Walking or climbing stairs? Y  Dressing or bathing? N  Doing errands, shopping? Y  Some recent data might be hidden    Fall/Depression Screening: Fall Risk  11/12/2017 10/10/2017 07/24/2017  Falls in the past year? No No Yes  Number falls in past yr: - - 2 or more  Injury with Fall? - - Yes  Comment - - She broke her foot 6-8 months ago  Risk Factor Category  - - High Fall Risk  Risk for fall due to : - - History of fall(s);Impaired balance/gait  Follow up - - Falls evaluation completed   PHQ 2/9 Scores 07/24/2017 07/22/2017 07/16/2017 01/20/2017 12/10/2016 12/25/2015 07/12/2014  PHQ - 2 Score 2 4 - 0 0 0 0  PHQ- 9 Score 8 12 - - - - -  Exception Documentation - - Other- indicate reason in comment box - - - -  Not completed - - Daughter answered screening questions on patient's behalf.  - - - -    Assessment: Patient continues to benefit from health coach outreach for disease management and support.    THN CM Care Plan Problem One     Most Recent Value  THN Long Term Goal   In 90 days the patient and caregiver will learn the copd action plan  THN Long Term Goal Start Date  11/12/17  Interventions for Problem One Long Term Goal  [facility] discussed with the cargiver about signs and syumptoms of the care plan  THN CM Short Term Goal #1   IN 30 days the caregiver will verbalize no falls since lasst conversation  THN CM Short Term Goal #1 Start Date  11/12/17  [facility] CM Short Term Goal #1 Met Date  11/12/17  THN CM Short Term Goal #2   In 30 days the caregiver will verbalize the patient is maintaing weight   THN CM Short Term Goal #2 Start Date  11/12/17  Interventions for Short Term Goal #2  [place] discussed with the caregiver about diet and the importance of staying  healthy     Plan:  [place] will contact patient in the month of March and patient agrees to next outreach.  Lazaro Arms RN, BSN, [place] Direct Dial:  [phone] Fax: [phone]

## 2017-11-13 ENCOUNTER — Ambulatory Visit: Payer: Self-pay

## 2017-11-13 DIAGNOSIS — J449 Chronic obstructive pulmonary disease, unspecified: Secondary | ICD-10-CM | POA: Diagnosis not present

## 2017-11-13 DIAGNOSIS — J9611 Chronic respiratory failure with hypoxia: Secondary | ICD-10-CM | POA: Diagnosis not present

## 2017-11-18 ENCOUNTER — Encounter: Payer: Self-pay | Admitting: Family Medicine

## 2017-11-18 ENCOUNTER — Ambulatory Visit (INDEPENDENT_AMBULATORY_CARE_PROVIDER_SITE_OTHER): Payer: Medicare HMO | Admitting: Family Medicine

## 2017-11-18 VITALS — BP 132/70 | HR 94 | Ht 64.0 in | Wt 150.0 lb

## 2017-11-18 DIAGNOSIS — E118 Type 2 diabetes mellitus with unspecified complications: Secondary | ICD-10-CM | POA: Diagnosis not present

## 2017-11-18 DIAGNOSIS — J449 Chronic obstructive pulmonary disease, unspecified: Secondary | ICD-10-CM

## 2017-11-18 DIAGNOSIS — Z794 Long term (current) use of insulin: Secondary | ICD-10-CM

## 2017-11-18 NOTE — Patient Instructions (Addendum)
Please remember to schedule your yearly eye exam.   Increase Lantus to 14 units daily.

## 2017-11-18 NOTE — Progress Notes (Signed)
   Subjective:    Patient ID: Theresa Huang, female    DOB: Aug 12, 1931, 84 y.o.   MRN: [id number]  HPI 84 year old female here today to follow-up for COPD/emphysema-currently switch her toTrelogy, that have increased based on her recent spirometry results.  That she is been doing really well on the new inhaler.  She likes the fact that she can just use it once a day and she says in fact in the last couple days she is felt so good she has not even used her oxygen.  We also ordered a new nebulizer machine through [employer].  She does says she never received the nebulizer.  Diabetes - no hypoglycemic events. No wounds or sores that are not healing well. No increased thirst or urination. Checking glucose at home. Taking medications as prescribed without any side effects.  Overall her blood sugars look good but she did have a couple spikes into the 200 range.  Her says that she is been eating candy bars.    Review of Systems     Objective:   Physical Exam  Constitutional: She is oriented to person, place, and time. She appears well-developed and well-nourished.  HENT:  Head: Normocephalic and atraumatic.  Cardiovascular: Normal rate, regular rhythm and normal heart sounds.  Pulmonary/Chest: Effort normal and breath sounds normal.  Neurological: She is alert and oriented to person, place, and time.  Skin: Skin is warm and dry.  Psychiatric: She has a normal mood and affect. Her behavior is normal.          Assessment & Plan:  COPD-make sure using oxygen especially at least with activities.  Continue with her current inhaler regimen.  We will try to call [employer] and make sure they deliver a new nebulizer before albuterol treatments as needed.  Splane that the spikes in blood glucose are related to her diet.  Most of her sugars were in the 130 range but there were a couple in the 150s and a couple in the 200 range.  Diabetes-due for hemoglobin A1c and recheck renal function today.  Also gave her  a new blood glucose log today.  Increase Lantus to 14 units daily.

## 2017-11-19 LAB — COMPLETE METABOLIC PANEL WITH GFR
AG Ratio: 1.6 (calc) (ref 1.0–2.5)
ALBUMIN MSPROF: 4.2 g/dL (ref 3.6–5.1)
ALKALINE PHOSPHATASE (APISO): 64 U/L (ref 33–130)
ALT: 11 U/L (ref 6–29)
AST: 17 U/L (ref 10–35)
BILIRUBIN TOTAL: 0.4 mg/dL (ref 0.2–1.2)
BUN / CREAT RATIO: 17 (calc) (ref 6–22)
BUN: 33 mg/dL — AB (ref 7–25)
CHLORIDE: 98 mmol/L (ref 98–110)
CO2: 34 mmol/L — ABNORMAL HIGH (ref 20–32)
Calcium: 11 mg/dL — ABNORMAL HIGH (ref 8.6–10.4)
Creat: 1.97 mg/dL — ABNORMAL HIGH (ref 0.60–0.88)
GFR, Est African American: 26 mL/min/{1.73_m2} — ABNORMAL LOW (ref >=60)
GFR, Est Non African American: 23 mL/min/{1.73_m2} — ABNORMAL LOW (ref >=60)
Globulin: 2.6 g/dL (calc) (ref 1.9–3.7)
Glucose, Bld: 169 mg/dL — ABNORMAL HIGH (ref 65–99)
POTASSIUM: 4.3 mmol/L (ref 3.5–5.3)
SODIUM: 139 mmol/L (ref 135–146)
Total Protein: 6.8 g/dL (ref 6.1–8.1)

## 2017-11-19 LAB — HEMOGLOBIN A1C
HEMOGLOBIN A1C: 6.5 %{Hb} — AB (ref <5.7)
Mean Plasma Glucose: 140 (calc)
eAG (mmol/L): 7.7 (calc)

## 2017-11-26 DIAGNOSIS — J449 Chronic obstructive pulmonary disease, unspecified: Secondary | ICD-10-CM | POA: Diagnosis not present

## 2017-11-27 IMAGING — DX DG FOOT COMPLETE 3+V*R*
3 series · 3 of 3 positions shown · non-contrast
Comparison: 01/27/2017

CLINICAL DATA: 85-year-old female with lateral foot pain for 1
month following injury.

EXAM:
RIGHT FOOT COMPLETE - 3+ VIEW

[foot ap]
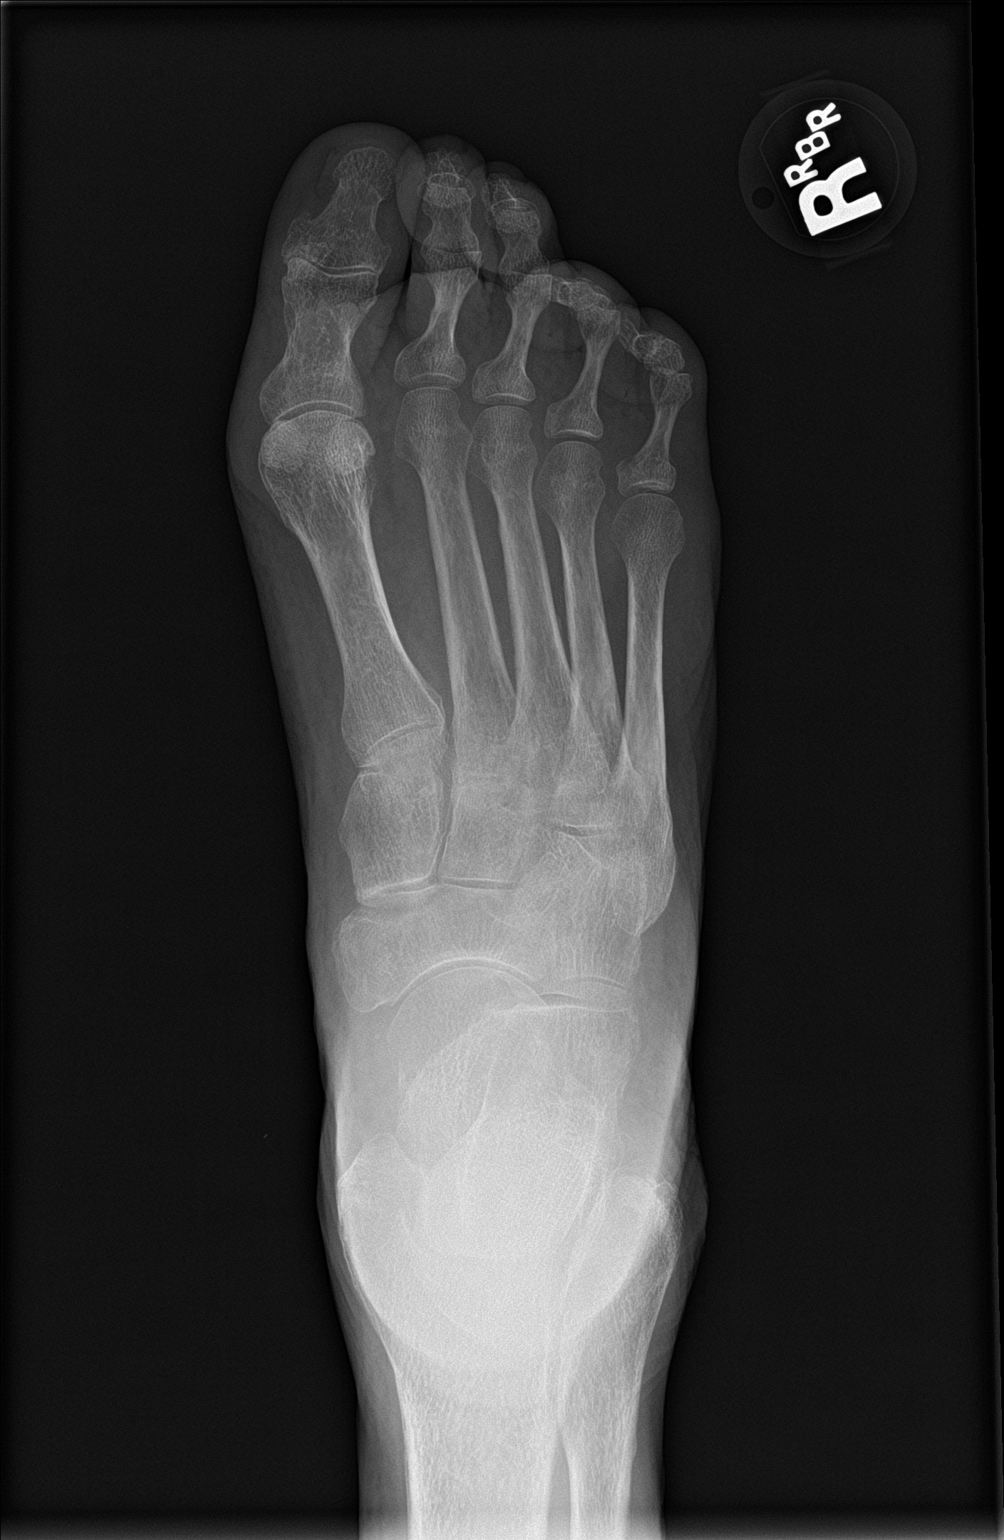

[foot obl]
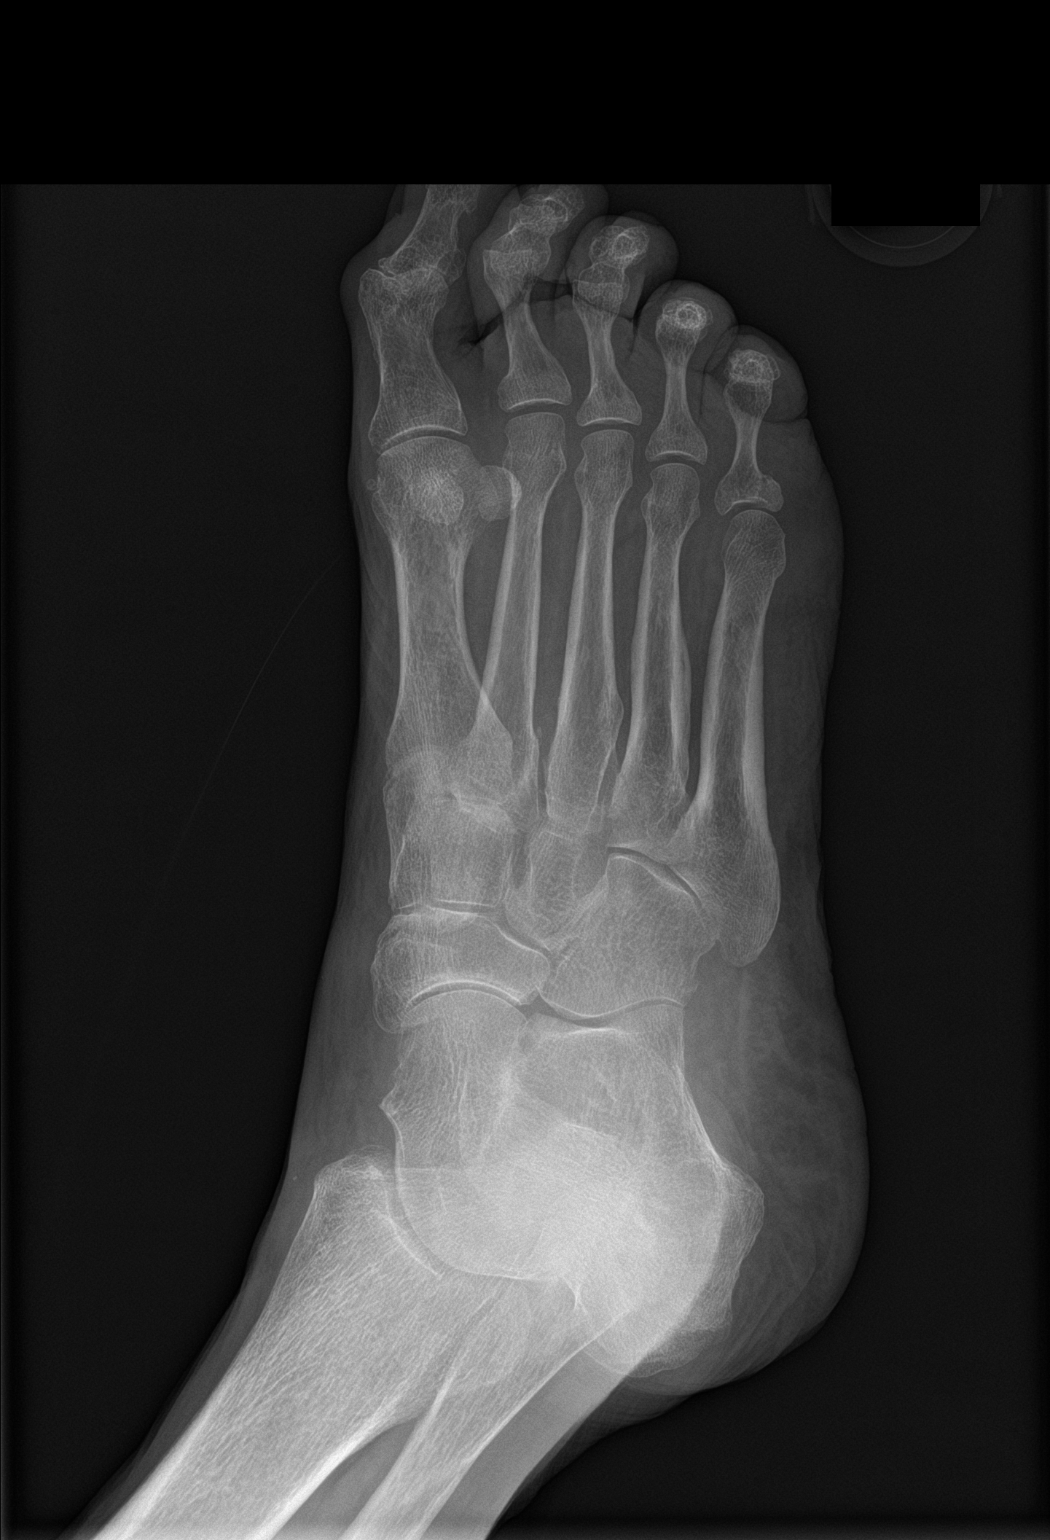

[foot lat]
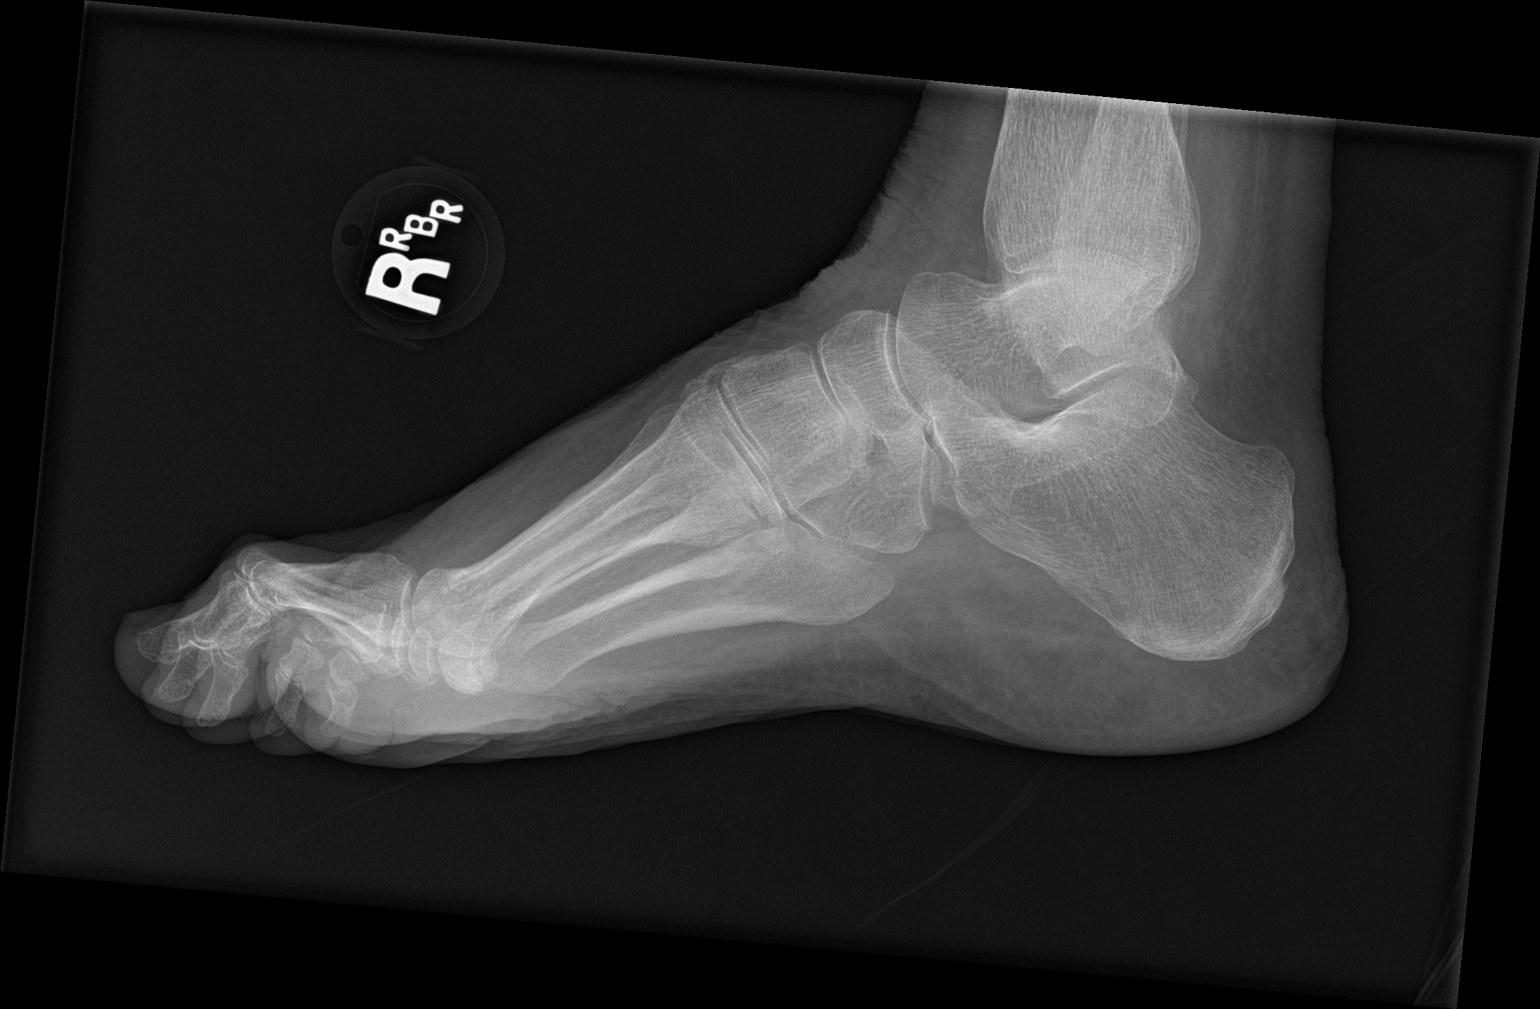

[3 of 3 positions shown; findings below may reference images not displayed]

FINDINGS: Bone mineralization is stable and normal for age. There is a
minimally displaced intra-articular fracture at the base of the
right fifth proximal phalanx, involving the fifth MTP (arrows). The
fifth metatarsal appears intact. No other acute osseous abnormality
identified. Joint spaces and alignment are normal for age.
IMPRESSION: Positive for an minimally displaced intra-articular fracture at the
base of the right fifth proximal phalanx.

## 2017-12-01 DIAGNOSIS — E119 Type 2 diabetes mellitus without complications: Secondary | ICD-10-CM | POA: Diagnosis not present

## 2017-12-01 DIAGNOSIS — N3941 Urge incontinence: Secondary | ICD-10-CM | POA: Diagnosis not present

## 2017-12-06 DIAGNOSIS — Q348 Other specified congenital malformations of respiratory system: Secondary | ICD-10-CM | POA: Diagnosis not present

## 2017-12-06 DIAGNOSIS — J45909 Unspecified asthma, uncomplicated: Secondary | ICD-10-CM | POA: Diagnosis not present

## 2017-12-11 ENCOUNTER — Other Ambulatory Visit: Payer: Self-pay

## 2017-12-11 NOTE — Patient Outreach (Signed)
[facility]) Care Management  [place]  12/11/2017   Theresa Huang 23-Apr-1931 [id number]  Subjective: Telephone call placed to the patient for monthly assessment. Caregiver able to verify HIPAA.  [name] caregiver states that the patient has been doing well.  She denies any COPD exacerbation.  The patient has chronic pain in her back and shoulders that she rates at a 7/10.  She had a fall two weeks ago but did not hurt herself.  RN Health Coach discussed with the caregiver about fall precautions.  Caregiver verbalized understanding. The patient does some walking in the home and caregiver makes sure she uses her walker.  The patient weighed and she is a 140 lbs.  She is not having any swelling.  She is adherent with her medications. The patient has an appointment in the next two weeks with her orthopedist and an appointment with her primary in the next three months   Encounter Medications:  Outpatient Encounter Medications as of 12/11/2017  Medication Sig  . acetaminophen (TYLENOL ARTHRITIS PAIN) 650 MG CR tablet Take 650 mg by mouth every 8 (eight) hours as needed. For pain  . albuterol (ACCUNEB) 1.25 MG/3ML nebulizer solution Take 3 mLs (1.25 mg total) by nebulization every 6 (six) hours as needed for wheezing. Please fax to [place].  [name] alendronate (FOSAMAX) 70 MG tablet TAKE ONE TABLET ONCE A WEEK  . ALLERGY 10 MG tablet TAKE 1 TABLET BY MOUTH ONCE DAILY  . AMBULATORY NON FORMULARY MEDICATION Medication Name: glucometer strips to test BID.  Dx. 250.00  . AMBULATORY NON FORMULARY MEDICATION Medication Name: Portable oxygen - 2 liters. Dx. COPD.  Send to [place].  . AMBULATORY NON FORMULARY MEDICATION Medication Name: Nebulizer for albuterol. Dx. Severe COPD.  Please see separate Rx for albuterol soln.  [name] aspirin 81 MG tablet Take 81 mg by mouth daily.    . Calcium-Vitamin D (CALTRATE 600 PLUS-VIT D PO) Take 1 tablet by mouth daily.   . diclofenac sodium (VOLTAREN) 1 %  GEL APPLY 2 GRAMS TOPICALLY FOUR TIMES A DAY. CAN APPLY TO SHOULDERS OR KNEES AS NEEDED.  [name] Fluticasone-Umeclidin-Vilant (TRELEGY ELLIPTA) 100-62.5-25 MCG/INH AEPB Inhale 1 puff into the lungs daily.  . Insulin Glargine (LANTUS SOLOSTAR) 100 UNIT/ML Solostar Pen Inject 12 Units at bedtime into the skin.  [name] lidocaine (LIDODERM) 5 % Place 1 patch onto the skin as needed. Remove & Discard patch within 12 hours or as directed by MD  . Lidocaine HCl 3 % LOTN Apply topically as needed.  . Multiple Vitamin (MULTIVITAMIN) tablet Take 1 tablet by mouth daily.    . naproxen (NAPROSYN) 500 MG tablet TAKE ONE TABLET BY MOUTH 2 TIMES A DAY WITH A MEAL AS NEEDED FOR JAW AND EAR PAIN  . nitroGLYCERIN (NITROSTAT) 0.4 MG SL tablet Place 1 tablet (0.4 mg total) under the tongue every 5 (five) minutes as needed. For chest pain  . QC LANCETS SUPER THIN MISC TEST TWICE DAILY  . sertraline (ZOLOFT) 100 MG tablet TAKE 1 & 1/2 TABLETS BY MOUTH EVERY DAY  . simvastatin (ZOCOR) 20 MG tablet Take 1 tablet (20 mg total) by mouth daily at 6 PM.  . SYNTHROID 75 MCG tablet TAKE ONE TABLET BY MOUTH EVERY DAY BEFORE BREAKFAST (Patient taking differently: 1 tablet daily except for 1.5 tablet on Sunday.)  . traMADol (ULTRAM) 50 MG tablet Take 2 tablets (100 mg total) by mouth 3 (three) times daily as needed.  Angelia Mould TEST test strip TEST  2 TIMES A DAY AS DIRECTED  . UNIFINE PENTIPS 29G X 12MM MISC USE ONCE DAILY AS DIRECTED  . VENTOLIN HFA 108 (90 Base) MCG/ACT inhaler INHALE 2 PUFFS INTO THE LUNGS EVERY 4 HOURS AS NEEDED FOR WHEEZING   No facility-administered encounter medications on file as of 12/11/2017.     Functional Status:  In your present state of health, do you have any difficulty performing the following activities: 07/24/2017  Hearing? Y  Vision? Y  Comment glassess  Difficulty concentrating or making decisions? Y  Comment sometimes  Walking or climbing stairs? Y  Dressing or bathing? N  Doing errands,  shopping? Y  Some recent data might be hidden    Fall/Depression Screening: Fall Risk  12/11/2017 11/12/2017 10/10/2017  Falls in the past year? Yes No No  Number falls in past yr: 1 - -  Injury with Fall? No - -  Comment - - -  Risk Factor Category  High Fall Risk - -  Risk for fall due to : - - -  Follow up Falls evaluation completed;Education provided - -   PHQ 2/9 Scores 07/24/2017 07/22/2017 07/16/2017 01/20/2017 12/10/2016 12/25/2015 07/12/2014  PHQ - 2 Score 2 4 - 0 0 0 0  PHQ- 9 Score 8 12 - - - - -  Exception Documentation - - Other- indicate reason in comment box - - - -  Not completed - - Daughter answered screening questions on patient's behalf.  - - - -    Assessment: Patient continues to benefit from health coach outreach for disease management and support.   THN CM Care Plan Problem One     Most Recent Value  THN Long Term Goal   In 90 days the patient and caregiver will learn the copd action plan  THN Long Term Goal Start Date  12/11/17  Interventions for Problem One Long Term Goal  [facility] talked with the patient about signs and symptoms of zones  THN CM Short Term Goal #1   IN 30 days the caregiver will verbalize no falls since lasst conversation  THN CM Short Term Goal #1 Start Date  12/11/17  Interventions for Short Term Goal #1  [facility] talked with caregiver and the patient fell two weeks ago.   We discusse fall prevention  THN CM Short Term Goal #2   In 30 days the caregiver will verbalize the patient is maintaing weight   THN CM Short Term Goal #2 Start Date  12/11/17  [facility] CM Short Term Goal #2 Met Date  12/11/17  Interventions for Short Term Goal #2  [facility] discussed with the caregiver about the patient's weight and she has been maintaining her eight and no swelling.  THN CM Short Term Goal #3  In 30 days the caregiver will state that the patient's  blood sugars have been under 200  THN CM Short Term Goal #3 Start Date  12/11/17  Interventions for Short Tern Goal #3  [facility]  talked with the cargiver about the patient's food intake.     Plan:  RN Health Coach will contact patient in the month of April and patient agrees to next outreach.  Lazaro Arms RN, BSN, [place] Direct Dial:  [phone]  Fax: [phone]

## 2017-12-13 DIAGNOSIS — J449 Chronic obstructive pulmonary disease, unspecified: Secondary | ICD-10-CM | POA: Diagnosis not present

## 2017-12-13 DIAGNOSIS — J9611 Chronic respiratory failure with hypoxia: Secondary | ICD-10-CM | POA: Diagnosis not present

## 2017-12-27 DIAGNOSIS — J449 Chronic obstructive pulmonary disease, unspecified: Secondary | ICD-10-CM | POA: Diagnosis not present

## 2017-12-28 ENCOUNTER — Other Ambulatory Visit: Payer: Self-pay | Admitting: Family Medicine

## 2018-01-01 ENCOUNTER — Ambulatory Visit: Payer: Medicare HMO | Admitting: Sports Medicine

## 2018-01-01 DIAGNOSIS — E119 Type 2 diabetes mellitus without complications: Secondary | ICD-10-CM | POA: Diagnosis not present

## 2018-01-01 DIAGNOSIS — N3941 Urge incontinence: Secondary | ICD-10-CM | POA: Diagnosis not present

## 2018-01-04 DIAGNOSIS — E079 Disorder of thyroid, unspecified: Secondary | ICD-10-CM | POA: Diagnosis not present

## 2018-01-04 DIAGNOSIS — M79641 Pain in right hand: Secondary | ICD-10-CM | POA: Diagnosis not present

## 2018-01-04 DIAGNOSIS — I252 Old myocardial infarction: Secondary | ICD-10-CM | POA: Diagnosis not present

## 2018-01-04 DIAGNOSIS — S59911A Unspecified injury of right forearm, initial encounter: Secondary | ICD-10-CM | POA: Diagnosis not present

## 2018-01-04 DIAGNOSIS — I999 Unspecified disorder of circulatory system: Secondary | ICD-10-CM | POA: Diagnosis not present

## 2018-01-04 DIAGNOSIS — Z87891 Personal history of nicotine dependence: Secondary | ICD-10-CM | POA: Diagnosis not present

## 2018-01-04 DIAGNOSIS — Z7951 Long term (current) use of inhaled steroids: Secondary | ICD-10-CM | POA: Diagnosis not present

## 2018-01-04 DIAGNOSIS — E119 Type 2 diabetes mellitus without complications: Secondary | ICD-10-CM | POA: Diagnosis not present

## 2018-01-04 DIAGNOSIS — J449 Chronic obstructive pulmonary disease, unspecified: Secondary | ICD-10-CM | POA: Diagnosis not present

## 2018-01-04 DIAGNOSIS — G8911 Acute pain due to trauma: Secondary | ICD-10-CM | POA: Diagnosis not present

## 2018-01-04 DIAGNOSIS — Z883 Allergy status to other anti-infective agents status: Secondary | ICD-10-CM | POA: Diagnosis not present

## 2018-01-04 DIAGNOSIS — S5011XA Contusion of right forearm, initial encounter: Secondary | ICD-10-CM | POA: Diagnosis not present

## 2018-01-06 DIAGNOSIS — J45909 Unspecified asthma, uncomplicated: Secondary | ICD-10-CM | POA: Diagnosis not present

## 2018-01-06 DIAGNOSIS — Q348 Other specified congenital malformations of respiratory system: Secondary | ICD-10-CM | POA: Diagnosis not present

## 2018-01-11 ENCOUNTER — Other Ambulatory Visit: Payer: Self-pay | Admitting: Sports Medicine

## 2018-01-11 DIAGNOSIS — M19012 Primary osteoarthritis, left shoulder: Secondary | ICD-10-CM

## 2018-01-12 ENCOUNTER — Other Ambulatory Visit: Payer: Self-pay

## 2018-01-12 NOTE — Patient Outreach (Signed)
[facility]) Care Management  01/12/2018  GALILEA QUITO 07/04/1931 [id number]   1 st outreach attempt to the patient for monthly assessment. No answer.  HIPAA compliant voicemail left with contact information.  Plan: : RN Health Coach will send letter. RN Health Coach will make another attempt to the patient in four business days.   Lazaro Arms RN, BSN, [place] Direct Dial:  [phone]  Fax: [phone]

## 2018-01-12 NOTE — Patient Outreach (Signed)
[facility]) Care Management  [place]  01/12/2018   Theresa Huang [birth year] [id number]  Subjective:   [place] [facility]) Care Management  [place]  01/12/2018   Theresa Huang Feb 23, 1931 [id number]  Subjective: Received call from the caregiver.  HIPAA verified.  The patient has been doing fair.  Per the caregiver Theresa Huang she had a fall about a week ago.  The patient was walking in the kitchen for some thing and got disoriented and fell and hurt her arm.  The caregiver took her to the ER and she was put in a brace for a sprain.  She also sustained a bump on her head. The patient reports that her pain is a 6/10 and she takes Tramadol. Theresa Huang will call today and schedule a visit for a follow up. RN Health Coach talked with the patient about fall prevention.  She verbalized understanding.  She states that her breathing has been fine and has only had to use her oxygen one time because of the pollen.  She has been adherent with her medications.  Theresa Huang also states that she and the patient will be moving to [place] and should be fully moved by  June 1 st.  The have purchased a house.  She states that the number will not change and has given me the address.   Objective:   Encounter Medications:  Outpatient Encounter Medications as of 01/12/2018  Medication Sig  . acetaminophen (TYLENOL ARTHRITIS PAIN) 650 MG CR tablet Take 650 mg by mouth every 8 (eight) hours as needed. For pain  . albuterol (ACCUNEB) 1.25 MG/3ML nebulizer solution Take 3 mLs (1.25 mg total) by nebulization every 6 (six) hours as needed for wheezing. Please fax to [place].  [name] alendronate (FOSAMAX) 70 MG tablet TAKE ONE TABLET ONCE A WEEK  . ALLERGY 10 MG tablet TAKE 1 TABLET BY MOUTH ONCE DAILY  . AMBULATORY NON FORMULARY MEDICATION Medication Name: glucometer strips to test BID.  Dx. 250.00  . AMBULATORY NON FORMULARY MEDICATION Medication Name: Portable oxygen - 2 liters.  Dx. COPD.  Send to [place].  . AMBULATORY NON FORMULARY MEDICATION Medication Name: Nebulizer for albuterol. Dx. Severe COPD.  Please see separate Rx for albuterol soln.  [name] aspirin 81 MG tablet Take 81 mg by mouth daily.    . Calcium-Vitamin D (CALTRATE 600 PLUS-VIT D PO) Take 1 tablet by mouth daily.   . diclofenac sodium (VOLTAREN) 1 % GEL APPLY 2 GRAMS TOPICALLY FOUR TIMES A DAY. CAN APPLY TO SHOULDERS OR KNEES AS NEEDED.  [name] Fluticasone-Umeclidin-Vilant (TRELEGY ELLIPTA) 100-62.5-25 MCG/INH AEPB Inhale 1 puff into the lungs daily.  . Insulin Glargine (LANTUS SOLOSTAR) 100 UNIT/ML Solostar Pen Inject 12 Units at bedtime into the skin.  [name] lidocaine (LIDODERM) 5 % Place 1 patch onto the skin as needed. Remove & Discard patch within 12 hours or as directed by MD  . Lidocaine HCl 3 % LOTN Apply topically as needed.  . Multiple Vitamin (MULTIVITAMIN) tablet Take 1 tablet by mouth daily.    . naproxen (NAPROSYN) 500 MG tablet TAKE ONE TABLET BY MOUTH 2 TIMES A DAY WITH A MEAL AS NEEDED FOR JAW AND EAR PAIN  . nitroGLYCERIN (NITROSTAT) 0.4 MG SL tablet Place 1 tablet (0.4 mg total) under the tongue every 5 (five) minutes as needed. For chest pain  . QC LANCETS SUPER THIN MISC TEST TWICE DAILY  . sertraline (ZOLOFT) 100 MG tablet TAKE 1 & 1/2 TABLETS BY MOUTH  EVERY DAY  . simvastatin (ZOCOR) 20 MG tablet Take 1 tablet (20 mg total) by mouth daily at 6 PM.  . SYNTHROID 75 MCG tablet TAKE ONE TABLET BY MOUTH EVERY DAY BEFORE BREAKFAST (Patient taking differently: 1 tablet daily except for 1.5 tablet on Sunday.)  . traMADol (ULTRAM) 50 MG tablet TAKE TWO TABLETS BY MOUTH THREE TIMES DAILY AS NEEDED  . TRUETRACK TEST test strip TEST 2 TIMES A DAY AS DIRECTED  . UNIFINE PENTIPS 29G X 12MM MISC USE ONCE DAILY AS DIRECTED  . VENTOLIN HFA 108 (90 Base) MCG/ACT inhaler INHALE 2 PUFFS INTO THE LUNGS EVERY 4 HOURS AS NEEDED FOR WHEEZING   No facility-administered encounter medications on file as of 01/12/2018.      Functional Status:  In your present state of health, do you have any difficulty performing the following activities: 07/24/2017  Hearing? Y  Vision? Y  Comment glassess  Difficulty concentrating or making decisions? Y  Comment sometimes  Walking or climbing stairs? Y  Dressing or bathing? N  Doing errands, shopping? Y  Some recent data might be hidden    Fall/Depression Screening: Fall Risk  01/12/2018 12/11/2017 11/12/2017  Falls in the past year? Yes Yes No  Number falls in past yr: 1 1 -  Injury with Fall? Yes No -  Comment sprained wrist and knot on her head - -  Risk Factor Category  High Fall Risk High Fall Risk -  Risk for fall due to : - - -  Follow up Falls evaluation completed;Education provided;Falls prevention discussed Falls evaluation completed;Education provided -   [facility] 2/9 Scores 07/24/2017 07/22/2017 07/16/2017 01/20/2017 12/10/2016 12/25/2015 07/12/2014  PHQ - 2 Score 2 4 - 0 0 0 0  PHQ- 9 Score 8 12 - - - - -  Exception Documentation - - Other- indicate reason in comment box - - - -  Not completed - - Daughter answered screening questions on patient's behalf.  - - - -    Assessment:   Plan:      Encounter Medications:  Outpatient Encounter Medications as of 01/12/2018  Medication Sig  . acetaminophen (TYLENOL ARTHRITIS PAIN) 650 MG CR tablet Take 650 mg by mouth every 8 (eight) hours as needed. For pain  . albuterol (ACCUNEB) 1.25 MG/3ML nebulizer solution Take 3 mLs (1.25 mg total) by nebulization every 6 (six) hours as needed for wheezing. Please fax to [place].  [name] alendronate (FOSAMAX) 70 MG tablet TAKE ONE TABLET ONCE A WEEK  . ALLERGY 10 MG tablet TAKE 1 TABLET BY MOUTH ONCE DAILY  . AMBULATORY NON FORMULARY MEDICATION Medication Name: glucometer strips to test BID.  Dx. 250.00  . AMBULATORY NON FORMULARY MEDICATION Medication Name: Portable oxygen - 2 liters. Dx. COPD.  Send to [place].  . AMBULATORY NON FORMULARY MEDICATION Medication Name: Nebulizer for  albuterol. Dx. Severe COPD.  Please see separate Rx for albuterol soln.  [name] aspirin 81 MG tablet Take 81 mg by mouth daily.    . Calcium-Vitamin D (CALTRATE 600 PLUS-VIT D PO) Take 1 tablet by mouth daily.   . diclofenac sodium (VOLTAREN) 1 % GEL APPLY 2 GRAMS TOPICALLY FOUR TIMES A DAY. CAN APPLY TO SHOULDERS OR KNEES AS NEEDED.  [name] Fluticasone-Umeclidin-Vilant (TRELEGY ELLIPTA) 100-62.5-25 MCG/INH AEPB Inhale 1 puff into the lungs daily.  . Insulin Glargine (LANTUS SOLOSTAR) 100 UNIT/ML Solostar Pen Inject 12 Units at bedtime into the skin.  [name] lidocaine (LIDODERM) 5 % Place 1 patch onto the skin as needed.  Remove & Discard patch within 12 hours or as directed by MD  . Lidocaine HCl 3 % LOTN Apply topically as needed.  . Multiple Vitamin (MULTIVITAMIN) tablet Take 1 tablet by mouth daily.    . naproxen (NAPROSYN) 500 MG tablet TAKE ONE TABLET BY MOUTH 2 TIMES A DAY WITH A MEAL AS NEEDED FOR JAW AND EAR PAIN  . nitroGLYCERIN (NITROSTAT) 0.4 MG SL tablet Place 1 tablet (0.4 mg total) under the tongue every 5 (five) minutes as needed. For chest pain  . QC LANCETS SUPER THIN MISC TEST TWICE DAILY  . sertraline (ZOLOFT) 100 MG tablet TAKE 1 & 1/2 TABLETS BY MOUTH EVERY DAY  . simvastatin (ZOCOR) 20 MG tablet Take 1 tablet (20 mg total) by mouth daily at 6 PM.  . SYNTHROID 75 MCG tablet TAKE ONE TABLET BY MOUTH EVERY DAY BEFORE BREAKFAST (Patient taking differently: 1 tablet daily except for 1.5 tablet on Sunday.)  . traMADol (ULTRAM) 50 MG tablet TAKE TWO TABLETS BY MOUTH THREE TIMES DAILY AS NEEDED  . TRUETRACK TEST test strip TEST 2 TIMES A DAY AS DIRECTED  . UNIFINE PENTIPS 29G X 12MM MISC USE ONCE DAILY AS DIRECTED  . VENTOLIN HFA 108 (90 Base) MCG/ACT inhaler INHALE 2 PUFFS INTO THE LUNGS EVERY 4 HOURS AS NEEDED FOR WHEEZING   No facility-administered encounter medications on file as of 01/12/2018.     Functional Status:  In your present state of health, do you have any difficulty performing  the following activities: 07/24/2017  Hearing? Y  Vision? Y  Comment glassess  Difficulty concentrating or making decisions? Y  Comment sometimes  Walking or climbing stairs? Y  Dressing or bathing? N  Doing errands, shopping? Y  Some recent data might be hidden    Fall/Depression Screening: Fall Risk  01/12/2018 12/11/2017 11/12/2017  Falls in the past year? Yes Yes No  Number falls in past yr: 1 1 -  Injury with Fall? Yes No -  Comment sprained wrist and knot on her head - -  Risk Factor Category  High Fall Risk High Fall Risk -  Risk for fall due to : - - -  Follow up Falls evaluation completed;Education provided;Falls prevention discussed Falls evaluation completed;Education provided -   [facility] 2/9 Scores 07/24/2017 07/22/2017 07/16/2017 01/20/2017 12/10/2016 12/25/2015 07/12/2014  PHQ - 2 Score 2 4 - 0 0 0 0  PHQ- 9 Score 8 12 - - - - -  Exception Documentation - - Other- indicate reason in comment box - - - -  Not completed - - Daughter answered screening questions on patient's behalf.  - - - -    Assessment: Patient will continue to benefit from health coach outreach for disease management and support.  THN CM Care Plan Problem One     Most Recent Value  THN Long Term Goal   In 90 days the patient and caregiver will learn the copd action plan  THN Long Term Goal Start Date  01/12/18  Interventions for Problem One Long Term Goal  reviewed the copd action plan with the cargiver  [facility] CM Short Term Goal #1   IN 30 days the caregiver will verbalize no falls since lasst conversation  THN CM Short Term Goal #1 Start Date  01/12/18  Interventions for Short Term Goal #1  Reinterated fall prevention with the caregiver   [facility] CM Short Term Goal #3 Start Date  01/12/18  Interventions for Short Tern Goal #3  Discussed food intake  and healthy choices with the caregiver     Plan: [place] will contact patient in the month of May and patient agrees to next outreach.  Lazaro Arms RN, BSN,  [place] Direct Dial:  [phone]  Fax: [phone]

## 2018-01-13 ENCOUNTER — Ambulatory Visit: Payer: Self-pay

## 2018-01-13 DIAGNOSIS — J449 Chronic obstructive pulmonary disease, unspecified: Secondary | ICD-10-CM | POA: Diagnosis not present

## 2018-01-13 DIAGNOSIS — J9611 Chronic respiratory failure with hypoxia: Secondary | ICD-10-CM | POA: Diagnosis not present

## 2018-01-26 DIAGNOSIS — J449 Chronic obstructive pulmonary disease, unspecified: Secondary | ICD-10-CM | POA: Diagnosis not present

## 2018-01-30 ENCOUNTER — Other Ambulatory Visit: Payer: Self-pay

## 2018-01-30 NOTE — Patient Outreach (Signed)
[place] [facility]) Care Management  01/30/2018  Theresa Huang 10-Aug-1931 [id number]  I was unable to reach anyone at the home of Theresa Huang when I called to check on Theresa Huang's progress and needs.   Plan: We will follow up with Theresa Huang by phone for continued health coaching needs.    [place] Management  [phone]

## 2018-02-03 ENCOUNTER — Other Ambulatory Visit: Payer: Self-pay

## 2018-02-03 ENCOUNTER — Encounter: Payer: Self-pay | Admitting: *Deleted

## 2018-02-03 NOTE — Patient Outreach (Signed)
[facility]) Care Management  02/03/2018  WHITNI PASQUINI [birth year] [id number]   Mrs. NYELLIE YETTER is an 84 year old female with medical history which includes hypothyroidism, diabetes mellitus, type 2, COPD, CKD stage III, and cardiovascular disease. Mrs Zachow is being followed by our health coaching team for monthly assessment.   I spoke with Mrs. Victory's daughter and primary caregiver Ms. [name] Oast today. Mrs. Oast reported that Mrs. Virrueta is doing well overall. She says Mrs. Bottino has not had recent falls. She is sitting outside on the porch today enjoying the sunshine. Mrs. Woolman has reportedly not had any respiratory difficulties and is taking her medications as prescribed. I reviewed heat exposure precautions related to COPD management. Mrs. Oast indicated that Mrs. Lovin's diabetes has been well managed but did not have readings available.  Mrs. Oast let me know that the family will NOT be moving to [place] after all.   Plan: We will continue monthly outreach calls for ongoing health coaching.   THN CM Care Plan Problem One     Most Recent Value  Care Plan Problem One  Knowledge deficit  Role Documenting the Problem One  [place] for Problem One  Active  THN Long Term Goal   In 90 days the patient and caregiver will learn the copd action plan  THN Long Term Goal Start Date  01/12/18  Interventions for Problem One Long Term Goal  assessed COPD/respiratory symptoms, medication adherence, reviewed plan of care  THN CM Short Term Goal #1   IN 30 days the caregiver will verbalize no falls since lasst conversation  THN CM Short Term Goal #1 Start Date  01/12/18  [facility] CM Short Term Goal #1 Met Date  02/03/18  Interventions for Short Term Goal #1  assessed fall status (none),  reinterated fall precautions  THN CM Short Term Goal #2   Over the next 30 days, patient and cargiver will exercise respiratory pre-cautions as pertaining to  heat exposure  THN CM Short Term Goal #2 Start Date  02/03/18  Interventions for Short Term Goal #2  reviewed heat exposure precautions related to COPD management  THN CM Short Term Goal #3  In 30 days the caregiver will state that the patient's  blood sugars have been under 200  THN CM Short Term Goal #3 Start Date  01/12/18  Interventions for Short Tern Goal #3  reviewed diabetes signs and symptoms and cbg findings     [place] Care Management  [phone]

## 2018-02-04 NOTE — Telephone Encounter (Signed)
This encounter was created in error - please disregard.

## 2018-02-05 DIAGNOSIS — Q348 Other specified congenital malformations of respiratory system: Secondary | ICD-10-CM | POA: Diagnosis not present

## 2018-02-05 DIAGNOSIS — J45909 Unspecified asthma, uncomplicated: Secondary | ICD-10-CM | POA: Diagnosis not present

## 2018-02-12 DIAGNOSIS — J9611 Chronic respiratory failure with hypoxia: Secondary | ICD-10-CM | POA: Diagnosis not present

## 2018-02-12 DIAGNOSIS — J449 Chronic obstructive pulmonary disease, unspecified: Secondary | ICD-10-CM | POA: Diagnosis not present

## 2018-02-18 ENCOUNTER — Ambulatory Visit: Payer: Medicare HMO | Admitting: Family Medicine

## 2018-02-26 DIAGNOSIS — J449 Chronic obstructive pulmonary disease, unspecified: Secondary | ICD-10-CM | POA: Diagnosis not present

## 2018-03-03 DIAGNOSIS — E119 Type 2 diabetes mellitus without complications: Secondary | ICD-10-CM | POA: Diagnosis not present

## 2018-03-03 DIAGNOSIS — N3941 Urge incontinence: Secondary | ICD-10-CM | POA: Diagnosis not present

## 2018-03-05 ENCOUNTER — Other Ambulatory Visit: Payer: Self-pay

## 2018-03-05 DIAGNOSIS — Z961 Presence of intraocular lens: Secondary | ICD-10-CM | POA: Diagnosis not present

## 2018-03-05 DIAGNOSIS — Z794 Long term (current) use of insulin: Secondary | ICD-10-CM | POA: Diagnosis not present

## 2018-03-05 DIAGNOSIS — E119 Type 2 diabetes mellitus without complications: Secondary | ICD-10-CM | POA: Diagnosis not present

## 2018-03-05 DIAGNOSIS — H43813 Vitreous degeneration, bilateral: Secondary | ICD-10-CM | POA: Diagnosis not present

## 2018-03-05 DIAGNOSIS — H527 Unspecified disorder of refraction: Secondary | ICD-10-CM | POA: Diagnosis not present

## 2018-03-05 LAB — HM DIABETES EYE EXAM

## 2018-03-05 NOTE — Patient Outreach (Addendum)
[place] [facility]) Care Management  [place]  03/05/2018   Theresa Huang [birth year] [id number]  Subjective: Telephone call to the patient for assessment.  HIPAA verified by Theresa Huang the caregiver.  She states that the patient has been doing well.  [facility] her [facility] checked.  Caregiver states that the patient has not had any falls or has been complaining of pain.  She is adherent with all of her medications.  She states that when the patient gets up to walk that she is using her walker.  They are following heat precautions for her Copd.  She states that she has an appointment with Dr Madilyn Fireman next Tuesday for follow up.    Encounter Medications:  Outpatient Encounter Medications as of 03/05/2018  Medication Sig  . acetaminophen (TYLENOL ARTHRITIS PAIN) 650 MG CR tablet Take 650 mg by mouth every 8 (eight) hours as needed. For pain  . albuterol (ACCUNEB) 1.25 MG/3ML nebulizer solution Take 3 mLs (1.25 mg total) by nebulization every 6 (six) hours as needed for wheezing. Please fax to [place].  [name] alendronate (FOSAMAX) 70 MG tablet TAKE ONE TABLET ONCE A WEEK  . ALLERGY 10 MG tablet TAKE 1 TABLET BY MOUTH ONCE DAILY  . AMBULATORY NON FORMULARY MEDICATION Medication Name: glucometer strips to test BID.  Dx. 250.00  . AMBULATORY NON FORMULARY MEDICATION Medication Name: Portable oxygen - 2 liters. Dx. COPD.  Send to [place].  . AMBULATORY NON FORMULARY MEDICATION Medication Name: Nebulizer for albuterol. Dx. Severe COPD.  Please see separate Rx for albuterol soln.  [name] aspirin 81 MG tablet Take 81 mg by mouth daily.    . Calcium-Vitamin D (CALTRATE 600 PLUS-VIT D PO) Take 1 tablet by mouth daily.   . diclofenac sodium (VOLTAREN) 1 % GEL APPLY 2 GRAMS TOPICALLY FOUR TIMES A DAY. CAN APPLY TO SHOULDERS OR KNEES AS NEEDED.  [name] Fluticasone-Umeclidin-Vilant (TRELEGY ELLIPTA) 100-62.5-25 MCG/INH AEPB Inhale 1 puff into the lungs daily.  . Insulin  Glargine (LANTUS SOLOSTAR) 100 UNIT/ML Solostar Pen Inject 12 Units at bedtime into the skin.  . Lidocaine HCl 3 % LOTN Apply topically as needed.  . Multiple Vitamin (MULTIVITAMIN) tablet Take 1 tablet by mouth daily.    . naproxen (NAPROSYN) 500 MG tablet TAKE ONE TABLET BY MOUTH 2 TIMES A DAY WITH A MEAL AS NEEDED FOR JAW AND EAR PAIN  . nitroGLYCERIN (NITROSTAT) 0.4 MG SL tablet Place 1 tablet (0.4 mg total) under the tongue every 5 (five) minutes as needed. For chest pain  . QC LANCETS SUPER THIN MISC TEST TWICE DAILY  . sertraline (ZOLOFT) 100 MG tablet TAKE 1 & 1/2 TABLETS BY MOUTH EVERY DAY  . simvastatin (ZOCOR) 20 MG tablet Take 1 tablet (20 mg total) by mouth daily at 6 PM.  . SYNTHROID 75 MCG tablet TAKE ONE TABLET BY MOUTH EVERY DAY BEFORE BREAKFAST (Patient taking differently: 1 tablet daily except for 1.5 tablet on Sunday.)  . traMADol (ULTRAM) 50 MG tablet TAKE TWO TABLETS BY MOUTH THREE TIMES DAILY AS NEEDED  . TRUETRACK TEST test strip TEST 2 TIMES A DAY AS DIRECTED  . UNIFINE PENTIPS 29G X 12MM MISC USE ONCE DAILY AS DIRECTED  . VENTOLIN HFA 108 (90 Base) MCG/ACT inhaler INHALE 2 PUFFS INTO THE LUNGS EVERY 4 HOURS AS NEEDED FOR WHEEZING  . lidocaine (LIDODERM) 5 % Place 1 patch onto the skin as needed. Remove & Discard patch within 12 hours or  as directed by MD   No facility-administered encounter medications on file as of 03/05/2018.     Functional Status:  In your present state of health, do you have any difficulty performing the following activities: 07/24/2017  Hearing? Y  Vision? Y  Comment glassess  Difficulty concentrating or making decisions? Y  Comment sometimes  Walking or climbing stairs? Y  Dressing or bathing? N  Doing errands, shopping? Y  Some recent data might be hidden    Fall/Depression Screening: Fall Risk  03/05/2018 01/12/2018 12/11/2017  Falls in the past year? No Yes Yes  Number falls in past yr: - 1 1  Injury with Fall? - Yes No  Comment -  sprained wrist and knot on her head -  Risk Factor Category  - High Fall Risk High Fall Risk  Risk for fall due to : - - -  Follow up - Falls evaluation completed;Education provided;Falls prevention discussed Falls evaluation completed;Education provided   [facility] 2/9 Scores 07/24/2017 07/22/2017 07/16/2017 01/20/2017 12/10/2016 12/25/2015 07/12/2014  PHQ - 2 Score 2 4 - 0 0 0 0  PHQ- 9 Score 8 12 - - - - -  Exception Documentation - - Other- indicate reason in comment box - - - -  Not completed - - Daughter answered screening questions on patient's behalf.  - - - -    Assessment: Patient will continue to benefit from health coach outreach for disease management and support.  THN CM Care Plan Problem One     Most Recent Value  THN Long Term Goal   In 90 days the patient and caregiver will learn the copd action plan  THN Long Term Goal Start Date  03/05/18  Interventions for Problem One Long Term Goal  Reviewed copd action plan/ signs and symtpmtoms and medication adherence  THN CM Short Term Goal #2   Over the next 30 days, patient and cargiver will exercise respiratory pre-cautions as pertaining to heat exposure  THN CM Short Term Goal #2 Start Date  03/05/18  [facility] CM Short Term Goal #2 Met Date  03/05/18  Interventions for Short Term Goal #2  [place] states that are following heat exsposure   precaustions     Plan: [place] will contact patient in the month of July and patient agrees to next outreach.  Theresa Arms RN, BSN, [place] Direct Dial:  [phone]  Fax: [phone]

## 2018-03-08 DIAGNOSIS — Q348 Other specified congenital malformations of respiratory system: Secondary | ICD-10-CM | POA: Diagnosis not present

## 2018-03-08 DIAGNOSIS — J45909 Unspecified asthma, uncomplicated: Secondary | ICD-10-CM | POA: Diagnosis not present

## 2018-03-10 ENCOUNTER — Ambulatory Visit (INDEPENDENT_AMBULATORY_CARE_PROVIDER_SITE_OTHER): Payer: Medicare HMO | Admitting: Family Medicine

## 2018-03-10 ENCOUNTER — Encounter: Payer: Self-pay | Admitting: Family Medicine

## 2018-03-10 VITALS — BP 133/69 | HR 72 | Ht 64.0 in | Wt 143.0 lb

## 2018-03-10 DIAGNOSIS — J438 Other emphysema: Secondary | ICD-10-CM | POA: Diagnosis not present

## 2018-03-10 DIAGNOSIS — F329 Major depressive disorder, single episode, unspecified: Secondary | ICD-10-CM

## 2018-03-10 DIAGNOSIS — M25531 Pain in right wrist: Secondary | ICD-10-CM | POA: Diagnosis not present

## 2018-03-10 DIAGNOSIS — Z794 Long term (current) use of insulin: Secondary | ICD-10-CM | POA: Diagnosis not present

## 2018-03-10 DIAGNOSIS — F32A Depression, unspecified: Secondary | ICD-10-CM

## 2018-03-10 DIAGNOSIS — E118 Type 2 diabetes mellitus with unspecified complications: Secondary | ICD-10-CM | POA: Diagnosis not present

## 2018-03-10 DIAGNOSIS — S6991XA Unspecified injury of right wrist, hand and finger(s), initial encounter: Secondary | ICD-10-CM | POA: Diagnosis not present

## 2018-03-10 LAB — POCT GLYCOSYLATED HEMOGLOBIN (HGB A1C): HEMOGLOBIN A1C: 7.5 % — AB (ref 4.0–5.6)

## 2018-03-10 NOTE — Patient Instructions (Signed)
Increase Lantus to 14 units a day. Cal in a couple of weeks if sugars are still running high.

## 2018-03-10 NOTE — Progress Notes (Signed)
Subjective:    Patient ID: Theresa Huang, female    DOB: November 10, 1930, 84 y.o.   MRN: [id number]  HPI  Diabetes - no hypoglycemic events. No wounds or sores that are not healing well. No increased thirst or urination. Checking glucose at home. Taking medications as prescribed without any side effects. Reports eye exam is UTD.   F/U COPD -no recent flares or exacerbations  Hearing loss-unfortunately she damaged her last hearing aid and is unable to get new ones through her insurance for 2 more years.  She is been feeling down and depressed more recently.  She just feels useless.  She says if she tries to get on the yard she gets too short of breath to do anything and she used to really enjoy yard work.  So now she stays at reading and doing crossword puzzles and watching TV and then does not fall asleep until about 4 AM and then sleeps most of the day.  She was going to the senior center for a period of time until she injured her wrist.  She reports that she fell a couple of months ago and injured her right wrist.  She does not member exactly how she fell.  She did go to the emergency department on April 21 and per their report it says that she fell while walking in the kitchen.  She thinks she may have slipped on something and she did hit her head.  She did not lose consciousness.  She has been wearing a cock-up splint intermittently since then because she says it will get sore and tender more so on the lateral portion of the wrist but sometimes the entire wrist will hurt.  She says it was swollen initially but has not noticed any recent swelling.  Review of Systems     Objective:   Physical Exam  Constitutional: She is oriented to person, place, and time. She appears well-developed and well-nourished.  HENT:  Head: Normocephalic and atraumatic.  Cardiovascular: Normal rate, regular rhythm and normal heart sounds.  Pulmonary/Chest: Effort normal and breath sounds normal.  Neurological:  She is alert and oriented to person, place, and time.  Skin: Skin is warm and dry.  Psychiatric: She has a normal mood and affect. Her behavior is normal.          Assessment & Plan:  DM- foot exam, performed today.  Hemoglobin A1c elevated at 7.5 today.  Uncontrolled.  Encouraged her to increase her Lantus to 14 units and make sure she is on track with her diet.  Her A1c was absolutely fantastic 3 months ago so I know she can do it.  Just encouraged her to also stay active.  Depression -I do think she has some depression we discussed some strategies around getting out of the house and getting back to the [facility] Care center again and feeling engaged.  I offered to refer her to a therapist or counselor but she declined.  She felt like she could really have anything to talk about.  She is already on Zoloft.  Is on 150 mg so I do not feel comfortable going above this.  We could consider changing the medication.  But I really want her to work on getting out of the house some and see if this helps.  COPD -shortness of breath does keep her from doing things like yard work which she normally enjoys but says she is actually doing well on the Trelegy and feels like it is  helpful.  Right wrist pain-she did have a negative x-ray when she went to the emergency department.  I suspect she still has some tendinitis.  I gave her some exercises to do at home on her own if it is not improving over the next couple of weeks and I want her to follow-up with 1 of our sports medicine providers.  Okay to wear the brace if she feels she is needing it or having increased pain or soreness.  Also consider repeat x-rays if not improving.

## 2018-03-15 DIAGNOSIS — J9611 Chronic respiratory failure with hypoxia: Secondary | ICD-10-CM | POA: Diagnosis not present

## 2018-03-15 DIAGNOSIS — J449 Chronic obstructive pulmonary disease, unspecified: Secondary | ICD-10-CM | POA: Diagnosis not present

## 2018-03-17 ENCOUNTER — Other Ambulatory Visit: Payer: Self-pay | Admitting: Family Medicine

## 2018-03-28 DIAGNOSIS — J449 Chronic obstructive pulmonary disease, unspecified: Secondary | ICD-10-CM | POA: Diagnosis not present

## 2018-04-03 DIAGNOSIS — N3941 Urge incontinence: Secondary | ICD-10-CM | POA: Diagnosis not present

## 2018-04-03 DIAGNOSIS — E119 Type 2 diabetes mellitus without complications: Secondary | ICD-10-CM | POA: Diagnosis not present

## 2018-04-07 ENCOUNTER — Other Ambulatory Visit: Payer: Self-pay | Admitting: Family Medicine

## 2018-04-07 ENCOUNTER — Other Ambulatory Visit: Payer: Self-pay

## 2018-04-07 DIAGNOSIS — J45909 Unspecified asthma, uncomplicated: Secondary | ICD-10-CM | POA: Diagnosis not present

## 2018-04-07 DIAGNOSIS — Q348 Other specified congenital malformations of respiratory system: Secondary | ICD-10-CM | POA: Diagnosis not present

## 2018-04-07 NOTE — Patient Outreach (Signed)
[facility]) Care Management  [employer]  04/07/2018   Theresa Huang 25-Feb-1931 [id number]  Subjective: Successful telephone call to the patient for assessment. HIPAA verified by Theresa Huang (POA).  She states that the patient has been doing well.  She has had a couple of falls since we last spoke and received bruises no other injuries.  Theresa Huang states that she will not walk with her walker and will trip.  I discussed fall precautions and encouraged the use of her walker. Theresa Huang verbalized understanding.  She states that patient still has her chronic pain but the patient was sleep and I was unable receive a pain rating.  She has not had any flares or exacerbation of her COPD.  She is adherent with all her medications.  She had an appointment with Dr. Ward Chatters 6/25.  Her a1c was checked and it was 7.5.  Discussed readings that range from 125-245.  She has not be adhering to any type of diet.  Discussed about monitoring her diet.  Theresa Huang states that she is not positive but feels that her next appointment is in August.  She has it written down but was  unable to get to it at the time.   Encounter Medications:  Outpatient Encounter Medications as of 04/07/2018  Medication Sig Note  . acetaminophen (TYLENOL ARTHRITIS PAIN) 650 MG CR tablet Take 650 mg by mouth every 8 (eight) hours as needed. For pain   . albuterol (ACCUNEB) 1.25 MG/3ML nebulizer solution Take 3 mLs (1.25 mg total) by nebulization every 6 (six) hours as needed for wheezing. Please fax to [place].   [name] alendronate (FOSAMAX) 70 MG tablet TAKE ONE TABLET ONCE A WEEK   . ALLERGY 10 MG tablet TAKE 1 TABLET BY MOUTH ONCE DAILY   . AMBULATORY NON FORMULARY MEDICATION Medication Name: glucometer strips to test BID.  Dx. 250.00   . AMBULATORY NON FORMULARY MEDICATION Medication Name: Portable oxygen - 2 liters. Dx. COPD.  Send to [place].   . AMBULATORY NON FORMULARY MEDICATION Medication Name: Nebulizer for albuterol. Dx.  Severe COPD.  Please see separate Rx for albuterol soln.   [name] aspirin 81 MG tablet Take 81 mg by mouth daily.     . diclofenac sodium (VOLTAREN) 1 % GEL APPLY 2 GRAMS TOPICALLY FOUR TIMES A DAY. CAN APPLY TO SHOULDERS OR KNEES AS NEEDED.   [name] Fluticasone-Umeclidin-Vilant (TRELEGY ELLIPTA) 100-62.5-25 MCG/INH AEPB Inhale 1 puff into the lungs daily.   . Insulin Glargine (LANTUS SOLOSTAR) 100 UNIT/ML Solostar Pen Inject 12 Units at bedtime into the skin. 04/07/2018: [name] POA says is up to 14 units   . Lidocaine HCl 3 % LOTN Apply topically as needed.   . Multiple Vitamin (MULTIVITAMIN) tablet Take 1 tablet by mouth daily.     . naproxen (NAPROSYN) 500 MG tablet TAKE ONE TABLET BY MOUTH 2 TIMES A DAY WITH A MEAL AS NEEDED FOR JAW AND EAR PAIN   . nitroGLYCERIN (NITROSTAT) 0.4 MG SL tablet Place 1 tablet (0.4 mg total) under the tongue every 5 (five) minutes as needed. For chest pain   . QC LANCETS SUPER THIN MISC TEST TWICE DAILY   . sertraline (ZOLOFT) 100 MG tablet TAKE 1 & 1/2 TABLETS BY MOUTH EVERY DAY   . simvastatin (ZOCOR) 20 MG tablet TAKE ONE TABLET BY MOUTH DAILY AT 6PM   . SYNTHROID 75 MCG tablet TAKE ONE TABLET BY MOUTH EVERY DAY BEFORE BREAKFAST (Patient taking differently: 1 tablet daily except for 1.5  tablet on Sunday.)   . traMADol (ULTRAM) 50 MG tablet TAKE TWO TABLETS BY MOUTH THREE TIMES DAILY AS NEEDED   . TRUETRACK TEST test strip TEST 2 TIMES A DAY AS DIRECTED   . UNIFINE PENTIPS 29G X 12MM MISC USE ONCE DAILY AS DIRECTED   . VENTOLIN HFA 108 (90 Base) MCG/ACT inhaler INHALE 2 PUFFS INTO THE LUNGS EVERY 4 HOURS AS NEEDED FOR WHEEZING   . Calcium-Vitamin D (CALTRATE 600 PLUS-VIT D PO) Take 1 tablet by mouth daily.    [name] lidocaine (LIDODERM) 5 % Place 1 patch onto the skin as needed. Remove & Discard patch within 12 hours or as directed by MD    No facility-administered encounter medications on file as of 04/07/2018.     Functional Status:  In your present state of health, do  you have any difficulty performing the following activities: 07/24/2017  Hearing? Y  Vision? Y  Comment glassess  Difficulty concentrating or making decisions? Y  Comment sometimes  Walking or climbing stairs? Y  Dressing or bathing? N  Doing errands, shopping? Y  Some recent data might be hidden    Fall/Depression Screening: Fall Risk  04/07/2018 03/10/2018 03/05/2018  Falls in the past year? Yes No No  Number falls in past yr: 2 or more - -  Injury with Fall? Yes - -  Comment Theresa Huang states that the patient tripped in the home and will for get to use her walker - -  Risk Factor Category  High Fall Risk - -  Risk for fall due to : - - -  Follow up Falls evaluation completed;Education provided;Falls prevention discussed - -   PHQ 2/9 Scores 07/24/2017 07/22/2017 07/16/2017 01/20/2017 12/10/2016 12/25/2015 07/12/2014  PHQ - 2 Score 2 4 - 0 0 0 0  PHQ- 9 Score 8 12 - - - - -  Exception Documentation - - Other- indicate reason in comment box - - - -  Not completed - - Daughter answered screening questions on patient's behalf.  - - - -    Assessment: Patient will continue to benefit from health coach outreach for disease management and support.  THN CM Care Plan Problem One     Most Recent Value  THN Long Term Goal   In 90 days the [facility] will verbalize the patient has remained hospital free  [place] Term Goal Start Date  04/07/18  Interventions for Problem One Long Term Goal  Reviewed medication adherence, sign and symptoms  THN CM Short Term Goal #1   IN 30 days the caregiver will verbalize no falls since lasst conversation  [facility] CM Short Term Goal #1 Start Date  04/07/18     Plan: [place] will contact patient in the month of August and patient agrees to next outreach.  Lazaro Arms RN, BSN, [place] Direct Dial:  [phone]  Fax: [phone]

## 2018-04-09 ENCOUNTER — Telehealth: Payer: Self-pay | Admitting: *Deleted

## 2018-04-09 NOTE — Telephone Encounter (Signed)
Spoke w/steve and informed him that the form that was received was illegible and that Dr. Madilyn Fireman would not be able to sign the form. He stated that he would fax 2 updated forms for signatures w/todays effective date to [phone]. Maryruth Eve, Lahoma Crocker, CMA

## 2018-04-14 DIAGNOSIS — J9611 Chronic respiratory failure with hypoxia: Secondary | ICD-10-CM | POA: Diagnosis not present

## 2018-04-14 DIAGNOSIS — J449 Chronic obstructive pulmonary disease, unspecified: Secondary | ICD-10-CM | POA: Diagnosis not present

## 2018-04-28 DIAGNOSIS — J449 Chronic obstructive pulmonary disease, unspecified: Secondary | ICD-10-CM | POA: Diagnosis not present

## 2018-05-04 DIAGNOSIS — N3941 Urge incontinence: Secondary | ICD-10-CM | POA: Diagnosis not present

## 2018-05-04 DIAGNOSIS — E119 Type 2 diabetes mellitus without complications: Secondary | ICD-10-CM | POA: Diagnosis not present

## 2018-05-08 DIAGNOSIS — J45909 Unspecified asthma, uncomplicated: Secondary | ICD-10-CM | POA: Diagnosis not present

## 2018-05-08 DIAGNOSIS — Q348 Other specified congenital malformations of respiratory system: Secondary | ICD-10-CM | POA: Diagnosis not present

## 2018-05-11 ENCOUNTER — Other Ambulatory Visit: Payer: Medicare HMO

## 2018-05-11 NOTE — Patient Outreach (Signed)
[facility]) Care Management  05/11/2018  Theresa Huang 03/22/1931 [id number]    1st outreach attempt to the patient for assessment.  No answer. HIPAA compliant voicemail left with contact information.  Plan: RN Health Coach will send letter. RN Health Coach will make another attempt to the patient within four business days.   Lazaro Arms RN, BSN, [place] Direct Dial:  [phone]  Fax: [phone]

## 2018-05-14 ENCOUNTER — Other Ambulatory Visit: Payer: Self-pay

## 2018-05-14 NOTE — Patient Outreach (Signed)
[facility]) Care Management  05/14/2018   ASHEA WINIARSKI 1930/10/15 [id number]  Subjective: Telephone placed to the patient.  HIPAA verified by [name] (POA). She states that the patient has been doing fine.  She reports that the patient did have a fall in the bath tub trying to get out and bruised her left shoulder.  I discussed fall precautions with [name] and she verbalized understanding. She was unable to rate the pain because she was not near the patient.  She states that she has not had any issue with her breathing. She has not has to use her inhalers or nebulizer in a few weeks.  She states that the patient's appetite has been good.  She is adherent with her medications. She was unable to tell me when her next appointment is because she was on her way to an appointment for herself.    Current Medications:  Current Outpatient Medications  Medication Sig Dispense Refill  . acetaminophen (TYLENOL ARTHRITIS PAIN) 650 MG CR tablet Take 650 mg by mouth every 8 (eight) hours as needed. For pain    . albuterol (ACCUNEB) 1.25 MG/3ML nebulizer solution Take 3 mLs (1.25 mg total) by nebulization every 6 (six) hours as needed for wheezing. Please fax to [place]. 75 mL 12  . alendronate (FOSAMAX) 70 MG tablet TAKE ONE TABLET ONCE A WEEK 4 tablet 12  . ALLERGY 10 MG tablet TAKE 1 TABLET BY MOUTH ONCE DAILY 30 tablet 11  . AMBULATORY NON FORMULARY MEDICATION Medication Name: glucometer strips to test BID.  Dx. 250.00 100 Units PRN  . AMBULATORY NON FORMULARY MEDICATION Medication Name: Portable oxygen - 2 liters. Dx. COPD.  Send to [place]. 1 vial 0  . AMBULATORY NON FORMULARY MEDICATION Medication Name: Nebulizer for albuterol. Dx. Severe COPD.  Please see separate Rx for albuterol soln. 1 Units 0  . aspirin 81 MG tablet Take 81 mg by mouth daily.      . Calcium-Vitamin D (CALTRATE 600 PLUS-VIT D PO) Take 1 tablet by mouth daily.     . diclofenac sodium (VOLTAREN) 1 % GEL APPLY 2  GRAMS TOPICALLY FOUR TIMES A DAY. CAN APPLY TO SHOULDERS OR KNEES AS NEEDED.  99  . Insulin Glargine (LANTUS SOLOSTAR) 100 UNIT/ML Solostar Pen Inject 12 Units at bedtime into the skin. 5 pen 6  . lidocaine (LIDODERM) 5 % Place 1 patch onto the skin as needed. Remove & Discard patch within 12 hours or as directed by MD    . Lidocaine HCl 3 % LOTN Apply topically as needed.    . Multiple Vitamin (MULTIVITAMIN) tablet Take 1 tablet by mouth daily.      . naproxen (NAPROSYN) 500 MG tablet TAKE ONE TABLET BY MOUTH 2 TIMES A DAY WITH A MEAL AS NEEDED FOR JAW AND EAR PAIN 60 tablet 3  . nitroGLYCERIN (NITROSTAT) 0.4 MG SL tablet Place 1 tablet (0.4 mg total) under the tongue every 5 (five) minutes as needed. For chest pain 30 tablet 0  . QC LANCETS SUPER THIN MISC TEST TWICE DAILY 100 each 2  . sertraline (ZOLOFT) 100 MG tablet TAKE 1 & 1/2 TABLETS BY MOUTH EVERY DAY 45 tablet 11  . simvastatin (ZOCOR) 20 MG tablet TAKE ONE TABLET BY MOUTH DAILY AT 6PM 30 tablet 11  . SYNTHROID 75 MCG tablet TAKE ONE TABLET BY MOUTH EVERY DAY BEFORE BREAKFAST (Patient taking differently: 1 tablet daily except for 1.5 tablet on Sunday.) 30 tablet 6  . traMADol (ULTRAM)  50 MG tablet TAKE TWO TABLETS BY MOUTH THREE TIMES DAILY AS NEEDED 180 tablet 0  . TRUETRACK TEST test strip TEST 2 TIMES A DAY AS DIRECTED 100 each PRN  . UNIFINE PENTIPS 29G X 12MM MISC USE ONCE DAILY AS DIRECTED 100 each 11  . VENTOLIN HFA 108 (90 Base) MCG/ACT inhaler INHALE 2 PUFFS INTO THE LUNGS EVERY 4 HOURS AS NEEDED FOR WHEEZING 18 g 3  . Fluticasone-Umeclidin-Vilant (TRELEGY ELLIPTA) 100-62.5-25 MCG/INH AEPB Inhale 1 puff into the lungs daily. (Patient not taking: Reported on 05/14/2018) 60 each 5   No current facility-administered medications for this visit.     Functional Status:  In your present state of health, do you have any difficulty performing the following activities: 07/24/2017  Hearing? Y  Vision? Y  Comment glassess  Difficulty  concentrating or making decisions? Y  Comment sometimes  Walking or climbing stairs? Y  Dressing or bathing? N  Doing errands, shopping? Y  Some recent data might be hidden    Fall/Depression Screening: Fall Risk  05/14/2018 05/14/2018 04/07/2018  Falls in the past year? Yes Yes Yes  Number falls in past yr: 1 1 2  or more  Injury with Fall? Yes Yes Yes  Comment - Bruised her left shoulder. [name] states that the patient tripped in the home and will for get to use her walker  Risk Factor Category  - - High Fall Risk  Risk for fall due to : - - -  Follow up Education provided - Falls evaluation completed;Education provided;Falls prevention discussed   PHQ 2/9 Scores 07/24/2017 07/22/2017 07/16/2017 01/20/2017 12/10/2016 12/25/2015 07/12/2014  PHQ - 2 Score 2 4 - 0 0 0 0  PHQ- 9 Score 8 12 - - - - -  Exception Documentation - - Other- indicate reason in comment box - - - -  Not completed - - Daughter answered screening questions on patient's behalf.  - - - -    Assessment: Patient will continue to benefit from health coach outreach for disease management and support.  THN CM Care Plan Problem One     Most Recent Value  THN Long Term Goal   In 90 days the [facility] will verbalize the patient has remained hospital free  [place] Term Goal Start Date  05/14/18  Interventions for Problem One Long Term Goal  reviewed mediction, signs and symptoms       Plan: RN Health Coach will contact patient in the month of October and patient agrees to next outreach.  Lazaro Arms RN, BSN, [place] Direct Dial:  [phone]  Fax: [phone]

## 2018-05-15 ENCOUNTER — Other Ambulatory Visit: Payer: Self-pay | Admitting: Family Medicine

## 2018-05-15 ENCOUNTER — Other Ambulatory Visit: Payer: Self-pay | Admitting: Sports Medicine

## 2018-05-15 DIAGNOSIS — M19012 Primary osteoarthritis, left shoulder: Secondary | ICD-10-CM

## 2018-05-15 DIAGNOSIS — J449 Chronic obstructive pulmonary disease, unspecified: Secondary | ICD-10-CM | POA: Diagnosis not present

## 2018-05-15 DIAGNOSIS — J9611 Chronic respiratory failure with hypoxia: Secondary | ICD-10-CM | POA: Diagnosis not present

## 2018-05-17 ENCOUNTER — Other Ambulatory Visit: Payer: Self-pay | Admitting: Sports Medicine

## 2018-05-20 ENCOUNTER — Telehealth: Payer: Self-pay

## 2018-05-20 NOTE — Telephone Encounter (Signed)
Theresa Huang's daughter-in-law and son both agree she needs to come off Tramadol urgently. They state it is causing her blood sugar to drop to 62 mg/dl. I asked if she was still taking insulin and they said yes 16 units at nighttime. Her son then stated she will not be taking the Tramadol any longer due to the chance of death with patient with diabetes. He states there is a Electrical engineer. They would like a different medication.

## 2018-05-21 NOTE — Telephone Encounter (Signed)
OK, cut insulin back to 14 units. Dr. Darene Lamer writes her tramadol. I would use the Naproxen she has and  2 extra strength Tylenol TID.

## 2018-05-22 NOTE — Telephone Encounter (Signed)
Family advised

## 2018-05-29 DIAGNOSIS — J449 Chronic obstructive pulmonary disease, unspecified: Secondary | ICD-10-CM | POA: Diagnosis not present

## 2018-06-08 DIAGNOSIS — Q348 Other specified congenital malformations of respiratory system: Secondary | ICD-10-CM | POA: Diagnosis not present

## 2018-06-08 DIAGNOSIS — J45909 Unspecified asthma, uncomplicated: Secondary | ICD-10-CM | POA: Diagnosis not present

## 2018-06-09 ENCOUNTER — Encounter: Payer: Self-pay | Admitting: Family Medicine

## 2018-06-09 ENCOUNTER — Ambulatory Visit (INDEPENDENT_AMBULATORY_CARE_PROVIDER_SITE_OTHER): Payer: Medicare HMO | Admitting: Family Medicine

## 2018-06-09 VITALS — BP 142/64 | HR 72 | Ht 64.0 in | Wt 142.0 lb

## 2018-06-09 DIAGNOSIS — M810 Age-related osteoporosis without current pathological fracture: Secondary | ICD-10-CM | POA: Diagnosis not present

## 2018-06-09 DIAGNOSIS — N183 Chronic kidney disease, stage 3 unspecified: Secondary | ICD-10-CM

## 2018-06-09 DIAGNOSIS — Z794 Long term (current) use of insulin: Secondary | ICD-10-CM | POA: Diagnosis not present

## 2018-06-09 DIAGNOSIS — E118 Type 2 diabetes mellitus with unspecified complications: Secondary | ICD-10-CM | POA: Diagnosis not present

## 2018-06-09 DIAGNOSIS — E113299 Type 2 diabetes mellitus with mild nonproliferative diabetic retinopathy without macular edema, unspecified eye: Secondary | ICD-10-CM | POA: Diagnosis not present

## 2018-06-09 DIAGNOSIS — Z23 Encounter for immunization: Secondary | ICD-10-CM

## 2018-06-09 DIAGNOSIS — M069 Rheumatoid arthritis, unspecified: Secondary | ICD-10-CM | POA: Diagnosis not present

## 2018-06-09 DIAGNOSIS — J438 Other emphysema: Secondary | ICD-10-CM | POA: Diagnosis not present

## 2018-06-09 LAB — POCT GLYCOSYLATED HEMOGLOBIN (HGB A1C): HEMOGLOBIN A1C: 6.8 % — AB (ref 4.0–5.6)

## 2018-06-09 NOTE — Progress Notes (Signed)
Subjective:    CC:   HPI:  Diabetes - no hypoglycemic events. No wounds or sores that are not healing well. No increased thirst or urination. Checking glucose at home. Taking medications as prescribed without any side effects.  F/U COPD -stable.  She is using her trilogy daily without any problems.  She does have a rescue inhaler but says she Artie has a new one at home.  F/U CKD  -no recent changes.  Esther arthritis-she did try the tramadol but it was causing her to sleep too much so they have stopped it.  Has a small lump on her anterior chest that she wants me to take a look at today.  She says it is really not very bothersome occasionally it will be itchy.  But she does feel it is getting a little larger.  Past medical history, Surgical history, Family history not pertinant except as noted below, Social history, Allergies, and medications have been entered into the medical record, reviewed, and corrections made.   Review of Systems: No fevers, chills, night sweats, weight loss, chest pain, or shortness of breath.   Objective:    General: Well Developed, well nourished, and in no acute distress.  Neuro: Alert and oriented x3, extra-ocular muscles intact, sensation grossly intact.  HEENT: Normocephalic, atraumatic  Skin: Warm and dry, no rashes.  She has a somewhat firm but mobile round lesion underneath the skin.  No surrounding or superficial erythema scale rash etc.  Most consistent with sebaceous cyst. Cardiac: Regular rate and rhythm, no murmurs rubs or gallops, no lower extremity edema.  Respiratory: Clear to auscultation bilaterally. Not using accessory muscles, speaking in full sentences.   Impression and Recommendations:    DM -much improved.  She is now on 14 units of insulin and her A1c is down to 6.8.  It sounds like her fasting blood sugars are running between 1 25-1 30s we will continue with current regimen with no changes.  Follow-up in 3 months.  COPD -stable.   Continue with daily Trelegy.  CKD 3 - due to recheck renal function.  Drug reaction-added tramadol to intolerance list for excess sedation.   Patient cyst-chest lesion is most consistent with sebaceous cyst.  When they get back from traveling to [place] in January she will give me a call and we will get her scheduled for removal.

## 2018-06-10 DIAGNOSIS — E113299 Type 2 diabetes mellitus with mild nonproliferative diabetic retinopathy without macular edema, unspecified eye: Secondary | ICD-10-CM | POA: Diagnosis not present

## 2018-06-10 DIAGNOSIS — E118 Type 2 diabetes mellitus with unspecified complications: Secondary | ICD-10-CM | POA: Diagnosis not present

## 2018-06-10 DIAGNOSIS — Z794 Long term (current) use of insulin: Secondary | ICD-10-CM | POA: Diagnosis not present

## 2018-06-10 DIAGNOSIS — N183 Chronic kidney disease, stage 3 (moderate): Secondary | ICD-10-CM | POA: Diagnosis not present

## 2018-06-10 DIAGNOSIS — J438 Other emphysema: Secondary | ICD-10-CM | POA: Diagnosis not present

## 2018-06-10 DIAGNOSIS — M810 Age-related osteoporosis without current pathological fracture: Secondary | ICD-10-CM | POA: Diagnosis not present

## 2018-06-11 LAB — COMPLETE METABOLIC PANEL WITH GFR
AG Ratio: 1.4 (calc) (ref 1.0–2.5)
ALKALINE PHOSPHATASE (APISO): 65 U/L (ref 33–130)
ALT: 10 U/L (ref 6–29)
AST: 15 U/L (ref 10–35)
Albumin: 3.9 g/dL (ref 3.6–5.1)
BUN/Creatinine Ratio: 21 (calc) (ref 6–22)
BUN: 33 mg/dL — AB (ref 7–25)
CALCIUM: 10 mg/dL (ref 8.6–10.4)
CO2: 32 mmol/L (ref 20–32)
Chloride: 103 mmol/L (ref 98–110)
Creat: 1.57 mg/dL — ABNORMAL HIGH (ref 0.60–0.88)
GFR, EST NON AFRICAN AMERICAN: 30 mL/min/{1.73_m2} — AB (ref >=60)
GFR, Est African American: 34 mL/min/{1.73_m2} — ABNORMAL LOW (ref >=60)
GLOBULIN: 2.8 g/dL (ref 1.9–3.7)
GLUCOSE: 142 mg/dL — AB (ref 65–99)
Potassium: 4.9 mmol/L (ref 3.5–5.3)
SODIUM: 139 mmol/L (ref 135–146)
Total Bilirubin: 0.5 mg/dL (ref 0.2–1.2)
Total Protein: 6.7 g/dL (ref 6.1–8.1)

## 2018-06-11 LAB — LIPID PANEL
CHOLESTEROL: 169 mg/dL (ref <200)
HDL: 61 mg/dL (ref >50)
LDL Cholesterol (Calc): 89 mg/dL (calc)
Non-HDL Cholesterol (Calc): 108 mg/dL (calc) (ref <130)
TRIGLYCERIDES: 91 mg/dL (ref <150)
Total CHOL/HDL Ratio: 2.8 (calc) (ref <5.0)

## 2018-06-11 LAB — VITAMIN D 25 HYDROXY (VIT D DEFICIENCY, FRACTURES): Vit D, 25-Hydroxy: 36 ng/mL (ref 30–100)

## 2018-06-15 DIAGNOSIS — J449 Chronic obstructive pulmonary disease, unspecified: Secondary | ICD-10-CM | POA: Diagnosis not present

## 2018-06-15 DIAGNOSIS — J9611 Chronic respiratory failure with hypoxia: Secondary | ICD-10-CM | POA: Diagnosis not present

## 2018-06-15 NOTE — Progress Notes (Signed)
Subjective:   Theresa Huang is a 84 y.o. female who presents for an Initial Medicare Annual Wellness Visit.  Review of Systems    No ROS.  Medicare Wellness Visit. Additional risk factors are reflected in the social history.   Cardiac Risk Factors include: diabetes mellitus;advanced age (>40men, >18 women);sedentary lifestyle;smoking/ tobacco exposure Sleep patterns: 11 hours of sleep. Wakes up 3 times a night to urinate.  Home Safety/Smoke Alarms: Feels safe in home. Smoke alarms in place.  Living environment; Lives with daughter in home has ramps in place for entering the house. Shower is a step over shower does have a bench but no grab bars in place.    Female:   Pap-aged out       Mammo- aged out      Dexa scan-  utd      CCS-aged out Eye Exam- utd    Objective:    Today's Vitals   06/23/18 1556  PainSc: 7    There is no height or weight on file to calculate BMI.  Advanced Directives 06/23/2018 07/24/2017 07/16/2017 12/27/2013 09/01/2012 09/01/2012 08/25/2012  Does Patient Have a Medical Advance Directive? No Yes Yes Patient does not have advance directive;Patient would like information Patient has advance directive, copy in chart Patient has advance directive, copy in chart Patient has advance directive, copy not in chart  Type of Advance Directive - Living will Healthcare Power of [place] of [place] of [place];Living will  Does patient want to make changes to medical advance directive? - - No - Patient declined - - - -  Copy of [place] in Chart? - - - - Copy requested from family - Copy requested from family  Would patient like information on creating a medical advance directive? Yes (MAU/Ambulatory/Procedural Areas - Information given) - - Advance directive packet given - - -  Pre-existing out of facility DNR order (yellow form or pink MOST form) - - - - No - No    Current  Medications (verified) Outpatient Encounter Medications as of 06/23/2018  Medication Sig  . acetaminophen (TYLENOL ARTHRITIS PAIN) 650 MG CR tablet Take 650 mg by mouth every 8 (eight) hours as needed. For pain  . albuterol (ACCUNEB) 1.25 MG/3ML nebulizer solution Take 3 mLs (1.25 mg total) by nebulization every 6 (six) hours as needed for wheezing. Please fax to [facility].  [name] alendronate (FOSAMAX) 70 MG tablet TAKE ONE TABLET ONCE A WEEK  . ALLERGY 10 MG tablet TAKE 1 TABLET BY MOUTH ONCE DAILY  . AMBULATORY NON FORMULARY MEDICATION Medication Name: glucometer strips to test BID.  Dx. 250.00  . AMBULATORY NON FORMULARY MEDICATION Medication Name: Portable oxygen - 2 liters. Dx. COPD.  Send to [facility].  . AMBULATORY NON FORMULARY MEDICATION Medication Name: Nebulizer for albuterol. Dx. Severe COPD.  Please see separate Rx for albuterol soln.  [name] aspirin 81 MG tablet Take 81 mg by mouth daily.    . Calcium-Vitamin D (CALTRATE 600 PLUS-VIT D PO) Take 1 tablet by mouth daily.   . cholecalciferol (VITAMIN D) 1000 units tablet Take 1,000 Units by mouth daily.  . diclofenac sodium (VOLTAREN) 1 % GEL APPLY 2 GRAMS TOPICALLY FOUR TIMES A DAY. CAN APPLY TO SHOULDERS OR KNEES AS NEEDED.  [name] Fluticasone-Umeclidin-Vilant (TRELEGY ELLIPTA) 100-62.5-25 MCG/INH AEPB Inhale 1 puff into the lungs daily.  . Insulin Glargine (LANTUS SOLOSTAR) 100 UNIT/ML Solostar Pen Inject 12 Units at bedtime into the skin.  [name]  Lidocaine HCl 3 % LOTN Apply topically as needed.  . Multiple Vitamin (MULTIVITAMIN) tablet Take 1 tablet by mouth daily.    . naproxen (NAPROSYN) 500 MG tablet TAKE ONE TABLET BY MOUTH 2 TIMES A DAY WITH A MEAL AS NEEDED FOR JAW AND EAR PAIN  . nitroGLYCERIN (NITROSTAT) 0.4 MG SL tablet Place 1 tablet (0.4 mg total) under the tongue every 5 (five) minutes as needed. For chest pain  . QC LANCETS SUPER THIN MISC TEST TWICE DAILY  . sertraline (ZOLOFT) 100 MG tablet TAKE 1 & 1/2 TABLETS BY MOUTH EVERY DAY  .  simvastatin (ZOCOR) 20 MG tablet TAKE ONE TABLET BY MOUTH DAILY AT 6PM  . SYNTHROID 75 MCG tablet TAKE ONE TABLET BY MOUTH EVERY DAY BEFORE BREAKFAST (Patient taking differently: 1 tablet daily except for 1.5 tablet on Sunday.)  . TRUETRACK TEST test strip TEST 2 TIMES A DAY AS DIRECTED  . UNIFINE PENTIPS 29G X 12MM MISC USE ONCE DAILY AS DIRECTED  . VENTOLIN HFA 108 (90 Base) MCG/ACT inhaler INHALE 2 PUFFS INTO THE LUNGS EVERY 4 HOURS AS NEEDED FOR WHEEZING  . [DISCONTINUED] lidocaine (LIDODERM) 5 % Place 1 patch onto the skin as needed. Remove & Discard patch within 12 hours or as directed by MD   No facility-administered encounter medications on file as of 06/23/2018.     Allergies (verified) Other; Tetracyclines & related; Tetracycline; and Tramadol   History: Past Medical History:  Diagnosis Date  . Anemia   . Cancer ([place])    SCC  . Carotid artery occlusion   . COPD (chronic obstructive pulmonary disease) (HCC)    wears oxygen at 3L prn daytime and 3L at night   . DDD (degenerative disc disease)   . Diabetes mellitus (HCC)    fasting blood sugar 80-100  . Diastolic dysfunction   . Dizziness   . Glaucoma   . Headache(784.0)    migranes  . Hearing loss of both ears    wears bilateral hearing aids  . Hemorrhoids   . Holter monitor, abnormal    wearing for 21 days  . Hyperlipidemia   . Hypothyroid   . Inflammatory polyps of colon with rectal bleeding ([place])   . Irregular heart beat   . Myocardial infarction ([place]) 2006  . Pneumonia    hx  . Retinopathy of left eye   . Shortness of breath    most of time  . Syncope    Past Surgical History:  Procedure Laterality Date  . ABDOMINAL HYSTERECTOMY    . CAROTID ENDARTERECTOMY    . COLONOSCOPY W/ POLYPECTOMY    . ENDARTERECTOMY  09/01/2012   Procedure: ENDARTERECTOMY CAROTID;  Surgeon: Conrad Rough and Ready, MD;  Location: [facility];  Service: Vascular;  Laterality: Right;  . EYE SURGERY     cataracts  . HEMORRHOID SURGERY    .  PATCH ANGIOPLASTY  09/01/2012   Procedure: PATCH ANGIOPLASTY;  Surgeon: Conrad Louisburg, MD;  Location: [facility];  Service: Vascular;  Laterality: Right;  Vascu-Guard Patch Angioplasty  . squamous cell removed    . THROAT SURGERY    . TONSILLECTOMY     Family History  Problem Relation Age of Onset  . Depression Mother   . Diabetes Mother   . CAD Mother   . Heart disease Mother   . Hyperlipidemia Daughter   . CAD Father   . Heart disease Father   . Diabetes Brother   . Hypertension Son   . Breast  cancer Daughter    Social History   Socioeconomic History  . Marital status: Widowed    Spouse name: Not on file  . Number of children: 4  . Years of education: 26  . Highest education level: 12th grade  Occupational History    Employer: RETIRED    Comment: nursing  Social Needs  . Financial resource strain: Not very hard  . Food insecurity:    Worry: Never true    Inability: Never true  . Transportation needs:    Medical: No    Non-medical: No  Tobacco Use  . Smoking status: Former Smoker    Types: Cigarettes    Last attempt to quit: 09/17/1987    Years since quitting: 30.7  . Smokeless tobacco: Never Used  Substance and Sexual Activity  . Alcohol use: No  . Drug use: No  . Sexual activity: Not Currently  Lifestyle  . Physical activity:    Days per week: 0 days    Minutes per session: 0 min  . Stress: Only a little  Relationships  . Social connections:    Talks on phone: Twice a week    Gets together: Never    Attends religious service: Never    Active member of club or organization: No    Attends meetings of clubs or organizations: Never    Relationship status: Widowed  Other Topics Concern  . Not on file  Social History Narrative   Lives with daughter, doesn't go outside much because of weeds and snakes. Does do puzzles a lot which stimulates the brain.    Tobacco Counseling Counseling given: Not Answered   Clinical Intake:  Pre-visit preparation completed:  Yes  Pain : 0-10 Pain Score: 7  Pain Type: Chronic pain Pain Location: Shoulder Pain Orientation: Left Pain Descriptors / Indicators: Aching, Sharp Pain Onset: More than a month ago Pain Frequency: Intermittent Pain Relieving Factors: Tylenol arthritis helps  Pain Relieving Factors: Tylenol arthritis helps  Diabetes: Yes CBG done?: No Did pt. bring in CBG monitor from home?: No  How often do you need to have someone help you when you read instructions, pamphlets, or other written materials from your doctor or pharmacy?: 1 - Never What is the last grade level you completed in school?: 13  Interpreter Needed?: No  Information entered by :: Orlie Dakin, LPN   Activities of Daily Living In your present state of health, do you have any difficulty performing the following activities: 06/23/2018 07/24/2017  Hearing? Y Y  Comment can't get hearing aids for another 2 years -  Vision? N Y  Comment readers glassess  Difficulty concentrating or making decisions? Y Y  Comment - sometimes  Walking or climbing stairs? Y Y  Dressing or bathing? N N  Doing errands, shopping? N Y  Conservation officer, nature and eating ? N -  Using the Toilet? N -  In the past six months, have you accidently leaked urine? N -  Do you have problems with loss of bowel control? N -  Managing your Medications? N -  Managing your Finances? N -  Housekeeping or managing your Housekeeping? N -  Some recent data might be hidden     Immunizations and Health Maintenance Immunization History  Administered Date(s) Administered  . H1N1 10/07/2008  . Influenza Split 07/09/2012  . Influenza Whole 06/04/2010  . Influenza, High Dose Seasonal PF 07/22/2016, 07/14/2017, 06/09/2018  . Influenza,inj,Quad PF,6+ Mos 05/25/2013, 07/12/2014, 06/16/2015  . Pneumococcal Conjugate-13 10/13/2014, 07/14/2017  .  Pneumococcal Polysaccharide-23 05/30/2009  . Td 03/28/2006  . Tdap 12/05/2015  . Zoster 04/22/2011   Health Maintenance Due   Topic Date Due  . URINE MICROALBUMIN  12/10/2017    Patient Care Team: Hali Marry, MD as PCP - General Stanford Breed Denice Bors, MD as Attending Physician (Cardiology) Lazaro Arms, RN as [place] any recent Medical Services you may have received from other than [facility] providers in the past year (date may be approximate).     Assessment:   This is a routine wellness examination for Kinder. Physical assessment deferred to PCP.   Hearing/Vision screen Hearing Screening Comments: Patient can not hear. Vision Screening Comments: Patient lost glasses so no exam done.  Dietary issues and exercise activities discussed: Current Exercise Habits: The patient does not participate in regular exercise at present, Exercise limited by: cardiac condition(s);orthopedic condition(s);psychological condition(s) Diet Healthy diet. Breakfast:cereal Lunch: sandwich Dinner: meat vegetables      Goals    . DIET - INCREASE WATER INTAKE     Increase water intake to 64 ounces a day. This will help your bowels as well.      Depression Screen PHQ 2/9 Scores 06/23/2018 06/09/2018 07/24/2017 07/22/2017 07/16/2017 01/20/2017 12/10/2016  PHQ - 2 Score 1 0 2 4 - 0 0  PHQ- 9 Score - - 8 12 - - -  Exception Documentation - - - - Other- indicate reason in comment box - -  Not completed - - - - Daughter answered screening questions on patient's behalf.  - -    Fall Risk Fall Risk  06/23/2018 05/14/2018 05/14/2018 04/07/2018 03/10/2018  Falls in the past year? Yes Yes Yes Yes No  Number falls in past yr: 2 or more 1 1 2  or more -  Injury with Fall? Yes Yes Yes Yes -  Comment - - Bruised her left shoulder. [name] states that the patient tripped in the home and will for get to use her walker -  Risk Factor Category  High Fall Risk - - High Fall Risk -  Risk for fall due to : History of fall(s);Impaired balance/gait - - - -  Follow up Falls prevention discussed Education  provided - Falls evaluation completed;Education provided;Falls prevention discussed -    Is the patient's home free of loose throw rugs in walkways, pet beds, electrical cords, etc?   yes      Grab bars in the bathroom? no      Handrails on the stairs?   no      Adequate lighting?   yes  Cognitive Function:     6CIT Screen 06/23/2018  What Year? 0 points  What month? 0 points  What time? 0 points  Count back from 20 0 points  Months in reverse 0 points  Repeat phrase 6 points  Total Score 6    Screening Tests Health Maintenance  Topic Date Due  . URINE MICROALBUMIN  12/10/2017  . HEMOGLOBIN A1C  12/08/2018  . OPHTHALMOLOGY EXAM  03/06/2019  . FOOT EXAM  03/11/2019  . TETANUS/TDAP  12/04/2025  . INFLUENZA VACCINE  Completed  . DEXA SCAN  Completed  . PNA vac Low Risk Adult  Completed      Plan:  Please schedule your next medicare wellness visit with me in 1 yr.  Ms. Dady , Thank you for taking time to come for your Medicare Wellness Visit. I appreciate your ongoing commitment to your health goals. Please review the following plan  we discussed and let me know if I can assist you in the future.  Continue doing brain stimulating activities (puzzles, reading, adult coloring books, staying active) to keep memory sharp.    These are the goals we discussed: Goals    . DIET - INCREASE WATER INTAKE     Increase water intake to 64 ounces a day. This will help your bowels as well.       This is a list of the screening recommended for you and due dates:  Health Maintenance  Topic Date Due  . Urine Protein Check  12/10/2017  . Hemoglobin A1C  12/08/2018  . Eye exam for diabetics  03/06/2019  . Complete foot exam   03/11/2019  . Tetanus Vaccine  12/04/2025  . Flu Shot  Completed  . DEXA scan (bone density measurement)  Completed  . Pneumonia vaccines  Completed      These are the goals we discussed: Goals    . DIET - INCREASE WATER INTAKE     Increase water  intake to 64 ounces a day. This will help your bowels as well.       This is a list of the screening recommended for you and due dates:  Health Maintenance  Topic Date Due  . Urine Protein Check  12/10/2017  . Hemoglobin A1C  12/08/2018  . Eye exam for diabetics  03/06/2019  . Complete foot exam   03/11/2019  . Tetanus Vaccine  12/04/2025  . Flu Shot  Completed  . DEXA scan (bone density measurement)  Completed  . Pneumonia vaccines  Completed     I have personally reviewed and noted the following in the patient's chart:   . Medical and social history . Use of alcohol, tobacco or illicit drugs  . Current medications and supplements . Functional ability and status . Nutritional status . Physical activity . Advanced directives . List of other physicians . Hospitalizations, surgeries, and ER visits in previous 12 months . Vitals . Screenings to include cognitive, depression, and falls . Referrals and appointments  In addition, I have reviewed and discussed with patient certain preventive protocols, quality metrics, and best practice recommendations. A written personalized care plan for preventive services as well as general preventive health recommendations were provided to patient.     Joanne Chars, LPN   10/21/971

## 2018-06-19 ENCOUNTER — Encounter: Payer: Self-pay | Admitting: Family Medicine

## 2018-06-23 ENCOUNTER — Ambulatory Visit (INDEPENDENT_AMBULATORY_CARE_PROVIDER_SITE_OTHER): Payer: Medicare HMO | Admitting: *Deleted

## 2018-06-23 VITALS — BP 137/66 | HR 68 | Ht 64.0 in | Wt 140.0 lb

## 2018-06-23 DIAGNOSIS — Z0000 Encounter for general adult medical examination without abnormal findings: Secondary | ICD-10-CM

## 2018-06-23 NOTE — Patient Instructions (Signed)

## 2018-06-24 ENCOUNTER — Other Ambulatory Visit: Payer: Self-pay | Admitting: Family Medicine

## 2018-06-28 DIAGNOSIS — J449 Chronic obstructive pulmonary disease, unspecified: Secondary | ICD-10-CM | POA: Diagnosis not present

## 2018-07-02 ENCOUNTER — Ambulatory Visit: Payer: Medicare HMO

## 2018-07-03 DIAGNOSIS — N3941 Urge incontinence: Secondary | ICD-10-CM | POA: Diagnosis not present

## 2018-07-03 DIAGNOSIS — E119 Type 2 diabetes mellitus without complications: Secondary | ICD-10-CM | POA: Diagnosis not present

## 2018-07-08 ENCOUNTER — Other Ambulatory Visit: Payer: Self-pay

## 2018-07-08 DIAGNOSIS — J45909 Unspecified asthma, uncomplicated: Secondary | ICD-10-CM | POA: Diagnosis not present

## 2018-07-08 DIAGNOSIS — Q348 Other specified congenital malformations of respiratory system: Secondary | ICD-10-CM | POA: Diagnosis not present

## 2018-07-08 NOTE — Patient Outreach (Signed)
[facility]) Care Management  07/08/2018   Theresa Huang 06-24-1931 [id number]  Subjective: Successful outreach to the patient for assessment. HIPAA verified by [name] (POA). Caregiver states that the patient is not having any pain.  She state that the patient had a fall last night.  Her leg gave out and she has a cut on her left elbow.  She states that the patient has been having some cough and congestion.  She is using her inhalers and wearing her oxygen on two liters.  The patient is taking all of herr medications as prescribed.  Caregiver states that she has been taken of tramadol. Caregiver states that she needs to have the settings reset on the patient's CPAP machine and she does not know how.  I advised the patient to call [employer] to come to the home and rest the machine.  She verbalized understanding.  The patient has received her flu shot at her last visit on 06/09/2018.  The patient is planning on a trip to [place] from December 11 to January 7th.  Her next appointment with her primary care  Is in January.    Current Medications:  Current Outpatient Medications  Medication Sig Dispense Refill  . acetaminophen (TYLENOL ARTHRITIS PAIN) 650 MG CR tablet Take 650 mg by mouth every 8 (eight) hours as needed. For pain    . albuterol (ACCUNEB) 1.25 MG/3ML nebulizer solution Take 3 mLs (1.25 mg total) by nebulization every 6 (six) hours as needed for wheezing. Please fax to [facility]. 75 mL 12  . alendronate (FOSAMAX) 70 MG tablet TAKE ONE TABLET ONCE A WEEK 4 tablet 12  . ALLERGY 10 MG tablet TAKE 1 TABLET BY MOUTH ONCE DAILY 30 tablet 11  . AMBULATORY NON FORMULARY MEDICATION Medication Name: glucometer strips to test BID.  Dx. 250.00 100 Units PRN  . AMBULATORY NON FORMULARY MEDICATION Medication Name: Portable oxygen - 2 liters. Dx. COPD.  Send to [facility]. 1 vial 0  . AMBULATORY NON FORMULARY MEDICATION Medication Name: Nebulizer for albuterol. Dx. Severe COPD.  Please see  separate Rx for albuterol soln. 1 Units 0  . aspirin 81 MG tablet Take 81 mg by mouth daily.      . Calcium-Vitamin D (CALTRATE 600 PLUS-VIT D PO) Take 1 tablet by mouth daily.     . cholecalciferol (VITAMIN D) 1000 units tablet Take 1,000 Units by mouth daily.    . diclofenac sodium (VOLTAREN) 1 % GEL APPLY 2 GRAMS TOPICALLY FOUR TIMES A DAY. CAN APPLY TO SHOULDERS OR KNEES AS NEEDED.  99  . Fluticasone-Umeclidin-Vilant (TRELEGY ELLIPTA) 100-62.5-25 MCG/INH AEPB Inhale 1 puff into the lungs daily. 60 each 5  . Insulin Glargine (LANTUS SOLOSTAR) 100 UNIT/ML Solostar Pen Inject 12 Units at bedtime into the skin. 5 pen 6  . Lidocaine HCl 3 % LOTN Apply topically as needed.    . Multiple Vitamin (MULTIVITAMIN) tablet Take 1 tablet by mouth daily.      . naproxen (NAPROSYN) 500 MG tablet TAKE ONE TABLET BY MOUTH 2 TIMES A DAY WITH A MEAL AS NEEDED FOR JAW AND EAR PAIN 60 tablet 3  . nitroGLYCERIN (NITROSTAT) 0.4 MG SL tablet Place 1 tablet (0.4 mg total) under the tongue every 5 (five) minutes as needed. For chest pain 30 tablet 0  . QC LANCETS SUPER THIN MISC TEST TWICE DAILY 100 each 2  . sertraline (ZOLOFT) 100 MG tablet TAKE 1 & 1/2 TABLETS BY MOUTH EVERY DAY 45 tablet 11  .  simvastatin (ZOCOR) 20 MG tablet TAKE ONE TABLET BY MOUTH DAILY AT 6PM 30 tablet 11  . SYNTHROID 75 MCG tablet TAKE ONE TABLET BY MOUTH EVERY DAY BEFORE BREAKFAST (Patient taking differently: 1 tablet daily except for 1.5 tablet on Sunday.) 30 tablet 6  . TRUETRACK TEST test strip TEST 2 TIMES A DAY AS DIRECTED 100 each PRN  . UNIFINE PENTIPS 29G X 12MM MISC USE ONCE DAILY AS DIRECTED 100 each 11  . VENTOLIN HFA 108 (90 Base) MCG/ACT inhaler INHALE 2 PUFFS INTO THE LUNGS EVERY 4 HOURS AS NEEDED FOR WHEEZING 18 g 3   No current facility-administered medications for this visit.     Functional Status:  In your present state of health, do you have any difficulty performing the following activities: 06/23/2018 07/24/2017   Hearing? Y Y  Comment can't get hearing aids for another 2 years -  Vision? N Y  Comment readers glassess  Difficulty concentrating or making decisions? Y Y  Comment - sometimes  Walking or climbing stairs? Y Y  Dressing or bathing? N N  Doing errands, shopping? N Y  Conservation officer, nature and eating ? N -  Using the Toilet? N -  In the past six months, have you accidently leaked urine? N -  Do you have problems with loss of bowel control? N -  Managing your Medications? N -  Managing your Finances? N -  Housekeeping or managing your Housekeeping? N -  Some recent data might be hidden    Fall/Depression Screening: Fall Risk  07/08/2018 06/23/2018 05/14/2018  Falls in the past year? Yes Yes Yes  Number falls in past yr: 1 2 or more 1  Injury with Fall? Yes Yes Yes  Comment - - -  Risk Factor Category  - High Fall Risk -  Risk for fall due to : - History of fall(s);Impaired balance/gait -  Follow up Falls evaluation completed Falls prevention discussed Education provided   [facility] Dt 2/9 Scores 06/23/2018 06/09/2018 07/24/2017 07/22/2017 07/16/2017 01/20/2017 12/10/2016  PHQ - 2 Score 1 0 2 4 - 0 0  PHQ- 9 Score - - 8 12 - - -  Exception Documentation - - - - Other- indicate reason in comment box - -  Not completed - - - - Daughter answered screening questions on patient's behalf.  - -    Assessment: Patient will continue to benefit from health coach outreach for disease management and support.  THN CM Care Plan Problem One     Most Recent Value  THN Long Term Goal   In 90 days the [facility] will verbalize the patient has remained hospital free  [place] Term Goal Start Date  07/08/18  Interventions for Problem One Long Term Goal  Discussed signs and symptoms of copd, medication and oxygen.       Plan: RN Health Coach will contact patient in the month of January and patient agrees to next outreach.  Lazaro Arms RN, BSN, [place] Direct Dial:  [phone]  Fax: [phone]

## 2018-07-15 DIAGNOSIS — J9611 Chronic respiratory failure with hypoxia: Secondary | ICD-10-CM | POA: Diagnosis not present

## 2018-07-15 DIAGNOSIS — J449 Chronic obstructive pulmonary disease, unspecified: Secondary | ICD-10-CM | POA: Diagnosis not present

## 2018-07-18 DIAGNOSIS — I252 Old myocardial infarction: Secondary | ICD-10-CM | POA: Diagnosis not present

## 2018-07-18 DIAGNOSIS — S52502A Unspecified fracture of the lower end of left radius, initial encounter for closed fracture: Secondary | ICD-10-CM | POA: Diagnosis not present

## 2018-07-18 DIAGNOSIS — S0990XA Unspecified injury of head, initial encounter: Secondary | ICD-10-CM | POA: Diagnosis not present

## 2018-07-18 DIAGNOSIS — R220 Localized swelling, mass and lump, head: Secondary | ICD-10-CM | POA: Diagnosis not present

## 2018-07-18 DIAGNOSIS — R55 Syncope and collapse: Secondary | ICD-10-CM | POA: Diagnosis not present

## 2018-07-18 DIAGNOSIS — M4802 Spinal stenosis, cervical region: Secondary | ICD-10-CM | POA: Diagnosis not present

## 2018-07-18 DIAGNOSIS — M25512 Pain in left shoulder: Secondary | ICD-10-CM | POA: Diagnosis not present

## 2018-07-18 DIAGNOSIS — S0003XA Contusion of scalp, initial encounter: Secondary | ICD-10-CM | POA: Diagnosis not present

## 2018-07-18 DIAGNOSIS — M19012 Primary osteoarthritis, left shoulder: Secondary | ICD-10-CM | POA: Diagnosis not present

## 2018-07-18 DIAGNOSIS — E119 Type 2 diabetes mellitus without complications: Secondary | ICD-10-CM | POA: Diagnosis not present

## 2018-07-18 DIAGNOSIS — S098XXA Other specified injuries of head, initial encounter: Secondary | ICD-10-CM | POA: Diagnosis not present

## 2018-07-18 DIAGNOSIS — J449 Chronic obstructive pulmonary disease, unspecified: Secondary | ICD-10-CM | POA: Diagnosis not present

## 2018-07-18 DIAGNOSIS — M47892 Other spondylosis, cervical region: Secondary | ICD-10-CM | POA: Diagnosis not present

## 2018-07-18 DIAGNOSIS — I4891 Unspecified atrial fibrillation: Secondary | ICD-10-CM | POA: Diagnosis not present

## 2018-07-18 DIAGNOSIS — Z9981 Dependence on supplemental oxygen: Secondary | ICD-10-CM | POA: Diagnosis not present

## 2018-07-18 DIAGNOSIS — S0993XA Unspecified injury of face, initial encounter: Secondary | ICD-10-CM | POA: Diagnosis not present

## 2018-07-18 DIAGNOSIS — Z87891 Personal history of nicotine dependence: Secondary | ICD-10-CM | POA: Diagnosis not present

## 2018-07-18 DIAGNOSIS — S199XXA Unspecified injury of neck, initial encounter: Secondary | ICD-10-CM | POA: Diagnosis not present

## 2018-07-18 DIAGNOSIS — M25522 Pain in left elbow: Secondary | ICD-10-CM | POA: Diagnosis not present

## 2018-07-18 DIAGNOSIS — I10 Essential (primary) hypertension: Secondary | ICD-10-CM | POA: Diagnosis not present

## 2018-07-19 DIAGNOSIS — M79602 Pain in left arm: Secondary | ICD-10-CM | POA: Diagnosis not present

## 2018-07-19 DIAGNOSIS — Z09 Encounter for follow-up examination after completed treatment for conditions other than malignant neoplasm: Secondary | ICD-10-CM | POA: Diagnosis not present

## 2018-07-19 DIAGNOSIS — S42402S Unspecified fracture of lower end of left humerus, sequela: Secondary | ICD-10-CM | POA: Diagnosis not present

## 2018-07-24 DIAGNOSIS — M25522 Pain in left elbow: Secondary | ICD-10-CM | POA: Diagnosis not present

## 2018-07-29 DIAGNOSIS — J449 Chronic obstructive pulmonary disease, unspecified: Secondary | ICD-10-CM | POA: Diagnosis not present

## 2018-08-04 ENCOUNTER — Other Ambulatory Visit: Payer: Self-pay | Admitting: Family Medicine

## 2018-08-05 DIAGNOSIS — M25522 Pain in left elbow: Secondary | ICD-10-CM | POA: Diagnosis not present

## 2018-08-05 DIAGNOSIS — M25512 Pain in left shoulder: Secondary | ICD-10-CM | POA: Diagnosis not present

## 2018-08-08 DIAGNOSIS — J45909 Unspecified asthma, uncomplicated: Secondary | ICD-10-CM | POA: Diagnosis not present

## 2018-08-08 DIAGNOSIS — Q348 Other specified congenital malformations of respiratory system: Secondary | ICD-10-CM | POA: Diagnosis not present

## 2018-08-15 DIAGNOSIS — J449 Chronic obstructive pulmonary disease, unspecified: Secondary | ICD-10-CM | POA: Diagnosis not present

## 2018-08-15 DIAGNOSIS — J9611 Chronic respiratory failure with hypoxia: Secondary | ICD-10-CM | POA: Diagnosis not present

## 2018-08-15 DIAGNOSIS — E119 Type 2 diabetes mellitus without complications: Secondary | ICD-10-CM | POA: Diagnosis not present

## 2018-08-15 DIAGNOSIS — S42402S Unspecified fracture of lower end of left humerus, sequela: Secondary | ICD-10-CM | POA: Diagnosis not present

## 2018-08-20 ENCOUNTER — Other Ambulatory Visit: Payer: Self-pay | Admitting: *Deleted

## 2018-08-20 DIAGNOSIS — J449 Chronic obstructive pulmonary disease, unspecified: Secondary | ICD-10-CM

## 2018-08-20 DIAGNOSIS — E118 Type 2 diabetes mellitus with unspecified complications: Secondary | ICD-10-CM

## 2018-08-20 DIAGNOSIS — Z794 Long term (current) use of insulin: Secondary | ICD-10-CM

## 2018-08-20 DIAGNOSIS — J438 Other emphysema: Secondary | ICD-10-CM

## 2018-08-20 MED ORDER — INSULIN GLARGINE 100 UNIT/ML SOLOSTAR PEN: 12.0000 [IU] | PEN_INJECTOR | Freq: Every day | SUBCUTANEOUS | 6 refills | Status: DC

## 2018-08-20 MED ORDER — FLUTICASONE-UMECLIDIN-VILANT 100-62.5-25 MCG/INH IN AEPB: 1.0000 | INHALATION_SPRAY | Freq: Every day | RESPIRATORY_TRACT | 11 refills | Status: DC

## 2018-08-20 MED ORDER — GLUCOSE BLOOD VI STRP: ORAL_STRIP | 11 refills | Status: DC

## 2018-08-24 DIAGNOSIS — M25522 Pain in left elbow: Secondary | ICD-10-CM | POA: Diagnosis not present

## 2018-08-25 DIAGNOSIS — E119 Type 2 diabetes mellitus without complications: Secondary | ICD-10-CM | POA: Diagnosis not present

## 2018-08-25 DIAGNOSIS — N3941 Urge incontinence: Secondary | ICD-10-CM | POA: Diagnosis not present

## 2018-08-28 DIAGNOSIS — J449 Chronic obstructive pulmonary disease, unspecified: Secondary | ICD-10-CM | POA: Diagnosis not present

## 2018-09-07 DIAGNOSIS — Q348 Other specified congenital malformations of respiratory system: Secondary | ICD-10-CM | POA: Diagnosis not present

## 2018-09-07 DIAGNOSIS — J45909 Unspecified asthma, uncomplicated: Secondary | ICD-10-CM | POA: Diagnosis not present

## 2018-09-14 DIAGNOSIS — J449 Chronic obstructive pulmonary disease, unspecified: Secondary | ICD-10-CM | POA: Diagnosis not present

## 2018-09-14 DIAGNOSIS — J9611 Chronic respiratory failure with hypoxia: Secondary | ICD-10-CM | POA: Diagnosis not present

## 2018-09-25 DIAGNOSIS — E119 Type 2 diabetes mellitus without complications: Secondary | ICD-10-CM | POA: Diagnosis not present

## 2018-09-25 DIAGNOSIS — N3941 Urge incontinence: Secondary | ICD-10-CM | POA: Diagnosis not present

## 2018-09-27 DIAGNOSIS — M25522 Pain in left elbow: Secondary | ICD-10-CM | POA: Diagnosis not present

## 2018-09-27 DIAGNOSIS — S42402S Unspecified fracture of lower end of left humerus, sequela: Secondary | ICD-10-CM | POA: Diagnosis not present

## 2018-09-27 DIAGNOSIS — E119 Type 2 diabetes mellitus without complications: Secondary | ICD-10-CM | POA: Diagnosis not present

## 2018-09-28 DIAGNOSIS — J449 Chronic obstructive pulmonary disease, unspecified: Secondary | ICD-10-CM | POA: Diagnosis not present

## 2018-09-29 ENCOUNTER — Ambulatory Visit: Payer: Medicare HMO | Admitting: Family Medicine

## 2018-10-05 DIAGNOSIS — M25522 Pain in left elbow: Secondary | ICD-10-CM | POA: Diagnosis not present

## 2018-10-08 ENCOUNTER — Other Ambulatory Visit: Payer: Self-pay

## 2018-10-08 DIAGNOSIS — J45909 Unspecified asthma, uncomplicated: Secondary | ICD-10-CM | POA: Diagnosis not present

## 2018-10-08 DIAGNOSIS — Q348 Other specified congenital malformations of respiratory system: Secondary | ICD-10-CM | POA: Diagnosis not present

## 2018-10-08 NOTE — Patient Outreach (Signed)
[facility]) Care Management  10/08/2018   GIANAH BATT [birth year] [id number]  Subjective: Successful outreach to the patient for assessment.  HIPAA verified by [name] ( on ROI).  Caregiver states that the patient is doing fine.  She had a fall in the beginning of December and broke her arm. She was in her room and lost her balance.  The patient stated that the pain is around a 7/10.  She was put on Flexeril 5 mg  for pain. Discussed fall precautions with caregiver.  She verbalized understanding.  Caregiver stated that the patient went on a trip to [place] and got the flu. She is better now that she is back home.  She denies any shortness of breath, cough, and wheezing..  She states that she is eating well her weight is 141 lbs.  She is taking her medications as prescribed.  The caregiver states that she will be getting a new PCP with [facility].  Current Medications:  Current Outpatient Medications  Medication Sig Dispense Refill  . acetaminophen (TYLENOL ARTHRITIS PAIN) 650 MG CR tablet Take 650 mg by mouth every 8 (eight) hours as needed. For pain    . albuterol (ACCUNEB) 1.25 MG/3ML nebulizer solution Take 3 mLs (1.25 mg total) by nebulization every 6 (six) hours as needed for wheezing. Please fax to [place]. 75 mL 12  . alendronate (FOSAMAX) 70 MG tablet TAKE ONE TABLET ONCE A WEEK 4 tablet 12  . ALLERGY 10 MG tablet TAKE 1 TABLET BY MOUTH ONCE DAILY 30 tablet 11  . AMBULATORY NON FORMULARY MEDICATION Medication Name: glucometer strips to test BID.  Dx. 250.00 100 Units PRN  . AMBULATORY NON FORMULARY MEDICATION Medication Name: Portable oxygen - 2 liters. Dx. COPD.  Send to [place]. 1 vial 0  . AMBULATORY NON FORMULARY MEDICATION Medication Name: Nebulizer for albuterol. Dx. Severe COPD.  Please see separate Rx for albuterol soln. 1 Units 0  . aspirin 81 MG tablet Take 81 mg by mouth daily.      . Calcium-Vitamin D (CALTRATE 600 PLUS-VIT D PO) Take 1 tablet  by mouth daily.     . cholecalciferol (VITAMIN D) 1000 units tablet Take 1,000 Units by mouth daily.    . cyclobenzaprine (FLEXERIL) 5 MG tablet Take 5 mg by mouth 3 (three) times daily as needed for muscle spasms.    . diclofenac sodium (VOLTAREN) 1 % GEL APPLY 2 GRAMS TOPICALLY FOUR TIMES A DAY. CAN APPLY TO SHOULDERS OR KNEES AS NEEDED.  99  . Fluticasone-Umeclidin-Vilant (TRELEGY ELLIPTA) 100-62.5-25 MCG/INH AEPB Inhale 1 puff into the lungs daily. 60 each 11  . glucose blood (TRUETRACK TEST) test strip Test 2 times a day. DX: E11.69 200 each 11  . Insulin Glargine (LANTUS SOLOSTAR) 100 UNIT/ML Solostar Pen Inject 12 Units into the skin at bedtime. 5 pen 6  . Lidocaine HCl 3 % LOTN Apply topically as needed.    . Multiple Vitamin (MULTIVITAMIN) tablet Take 1 tablet by mouth daily.      . nitroGLYCERIN (NITROSTAT) 0.4 MG SL tablet Place 1 tablet (0.4 mg total) under the tongue every 5 (five) minutes as needed. For chest pain 30 tablet 0  . QC LANCETS SUPER THIN MISC TEST TWICE DAILY 100 each 2  . sertraline (ZOLOFT) 100 MG tablet TAKE 1 & 1/2 TABLETS BY MOUTH EVERY DAY 45 tablet 11  . simvastatin (ZOCOR) 20 MG tablet TAKE ONE TABLET BY MOUTH DAILY AT 6PM 30 tablet 11  .  SYNTHROID 75 MCG tablet TAKE ONE TABLET BY MOUTH EVERY DAY BEFORE BREAKFAST (Patient taking differently: 1 tablet daily except for 1.5 tablet on Sunday.) 30 tablet 6  . UNIFINE PENTIPS 29G X 12MM MISC USE ONCE DAILY AS DIRECTED 100 each 11  . VENTOLIN HFA 108 (90 Base) MCG/ACT inhaler INHALE 2 PUFFS INTO THE LUNGS EVERY 4 HOURS AS NEEDED FOR WHEEZING 18 g 3  . naproxen (NAPROSYN) 500 MG tablet TAKE ONE TABLET BY MOUTH 2 TIMES A DAY WITH A MEAL AS NEEDED FOR JAW AND EAR PAIN 60 tablet 3   No current facility-administered medications for this visit.     Functional Status:  In your present state of health, do you have any difficulty performing the following activities: 06/23/2018  Hearing? Y  Comment can't get hearing aids for  another 2 years  Vision? N  Comment readers  Difficulty concentrating or making decisions? Y  Walking or climbing stairs? Y  Dressing or bathing? N  Doing errands, shopping? N  Preparing Food and eating ? N  Using the Toilet? N  In the past six months, have you accidently leaked urine? N  Do you have problems with loss of bowel control? N  Managing your Medications? N  Managing your Finances? N  Housekeeping or managing your Housekeeping? N  Some recent data might be hidden    Fall/Depression Screening: Fall Risk  07/08/2018 06/23/2018 05/14/2018  Falls in the past year? Yes Yes Yes  Number falls in past yr: 1 2 or more 1  Injury with Fall? Yes Yes Yes  Comment - - -  Risk Factor Category  - High Fall Risk -  Risk for fall due to : - History of fall(s);Impaired balance/gait -  Follow up Falls evaluation completed Falls prevention discussed Education provided   [facility] 2/9 Scores 06/23/2018 06/09/2018 07/24/2017 07/22/2017 07/16/2017 01/20/2017 12/10/2016  PHQ - 2 Score 1 0 2 4 - 0 0  PHQ- 9 Score - - 8 12 - - -  Exception Documentation - - - - Other- indicate reason in comment box - -  Not completed - - - - Daughter answered screening questions on patient's behalf.  - -    Assessment: Patient will continue to benefit from health coach outreach for disease management and support. THN CM Care Plan Problem One     Most Recent Value  THN Long Term Goal   In 90 days the [facility] will verbalize the patient has remained hospital free  [place] Term Goal Start Date  10/08/18  Interventions for Problem One Long Term Goal  Discussed fall precautions, copd signs and symptoms, copd action plan and medication adherence       Plan:  RN Health Coach will contact patient in the month of February and patient agrees to next outreach.    Lazaro Arms RN, BSN, [place] Direct Dial:  [phone]  Fax: [phone]

## 2018-10-12 ENCOUNTER — Telehealth: Payer: Self-pay

## 2018-10-12 NOTE — Telephone Encounter (Signed)
[name] called with Theresa Huang's fasting glucose readings. Please advise.   298 mg/dl 115 mg/dl 225 mg/dl 254 mg/dl 129 mg/dl 137 mg/dl 212 mg/dl

## 2018-10-12 NOTE — Telephone Encounter (Signed)
We verify Lantus dose.  I believe she is taking 12 units but that may not be accurate.  If she is taking 12 units then lets have her increase to 14 daily.  If she is taking something else then please let me know.

## 2018-10-13 NOTE — Telephone Encounter (Addendum)
Left message for a return call

## 2018-10-14 NOTE — Telephone Encounter (Signed)
Patient's daughter advised.  

## 2018-10-14 NOTE — Telephone Encounter (Signed)
Theresa Huang states she takes 12 units at bedtime and then changed her mind and stated she takes 14 units at bedtime.

## 2018-10-14 NOTE — Telephone Encounter (Signed)
Ok incresae to 16 units and then call with BS in 10 days.

## 2018-10-14 NOTE — Telephone Encounter (Signed)
Left message for a return call

## 2018-10-15 DIAGNOSIS — J9611 Chronic respiratory failure with hypoxia: Secondary | ICD-10-CM | POA: Diagnosis not present

## 2018-10-15 DIAGNOSIS — J449 Chronic obstructive pulmonary disease, unspecified: Secondary | ICD-10-CM | POA: Diagnosis not present

## 2018-10-17 DIAGNOSIS — J449 Chronic obstructive pulmonary disease, unspecified: Secondary | ICD-10-CM | POA: Diagnosis not present

## 2018-10-19 ENCOUNTER — Other Ambulatory Visit: Payer: Self-pay | Admitting: Family Medicine

## 2018-10-29 DIAGNOSIS — N3941 Urge incontinence: Secondary | ICD-10-CM | POA: Diagnosis not present

## 2018-10-29 DIAGNOSIS — E119 Type 2 diabetes mellitus without complications: Secondary | ICD-10-CM | POA: Diagnosis not present

## 2018-10-29 DIAGNOSIS — J449 Chronic obstructive pulmonary disease, unspecified: Secondary | ICD-10-CM | POA: Diagnosis not present

## 2018-11-01 ENCOUNTER — Other Ambulatory Visit: Payer: Self-pay | Admitting: Sports Medicine

## 2018-11-08 DIAGNOSIS — Q348 Other specified congenital malformations of respiratory system: Secondary | ICD-10-CM | POA: Diagnosis not present

## 2018-11-08 DIAGNOSIS — J45909 Unspecified asthma, uncomplicated: Secondary | ICD-10-CM | POA: Diagnosis not present

## 2018-11-14 DIAGNOSIS — J449 Chronic obstructive pulmonary disease, unspecified: Secondary | ICD-10-CM | POA: Diagnosis not present

## 2018-11-14 DIAGNOSIS — J9611 Chronic respiratory failure with hypoxia: Secondary | ICD-10-CM | POA: Diagnosis not present

## 2018-11-25 ENCOUNTER — Other Ambulatory Visit: Payer: Self-pay

## 2018-11-25 ENCOUNTER — Other Ambulatory Visit: Payer: Self-pay | Admitting: Family Medicine

## 2018-11-25 DIAGNOSIS — E118 Type 2 diabetes mellitus with unspecified complications: Secondary | ICD-10-CM

## 2018-11-25 DIAGNOSIS — Z794 Long term (current) use of insulin: Secondary | ICD-10-CM

## 2018-11-25 NOTE — Patient Outreach (Signed)
[facility]) Care Management  11/25/2018   Theresa Huang 13-Aug-1931 [id number]  Subjective: Successful outreach to the patient.  HIPAA verified by [name] (on ROI).  Caregiver states that the patient has been doing well. She denies any falls and no copd symptoms.  She has had pain in her left elbow but is taking medication to help. She states that she is taking her medication as prescribed.  Encouraged to the patient to drink water and discussed precautionary measures for coronavirus.  She verbalized understanding.  Objective:   Current Medications:  Current Outpatient Medications  Medication Sig Dispense Refill  . acetaminophen (TYLENOL ARTHRITIS PAIN) 650 MG CR tablet Take 650 mg by mouth every 8 (eight) hours as needed. For pain    . albuterol (ACCUNEB) 1.25 MG/3ML nebulizer solution Take 3 mLs (1.25 mg total) by nebulization every 6 (six) hours as needed for wheezing. Please fax to [place]. 75 mL 12  . albuterol (PROVENTIL HFA;VENTOLIN HFA) 108 (90 Base) MCG/ACT inhaler INHALE 2 PUFFS INTO THE LUNGS EVERY 4 HOURS AS NEEDED FOR WHEEZING 18 g 3  . alendronate (FOSAMAX) 70 MG tablet TAKE ONE TABLET ONCE A WEEK 4 tablet 12  . ALLERGY 10 MG tablet TAKE 1 TABLET BY MOUTH ONCE DAILY 30 tablet 11  . AMBULATORY NON FORMULARY MEDICATION Medication Name: glucometer strips to test BID.  Dx. 250.00 100 Units PRN  . AMBULATORY NON FORMULARY MEDICATION Medication Name: Portable oxygen - 2 liters. Dx. COPD.  Send to [place]. 1 vial 0  . AMBULATORY NON FORMULARY MEDICATION Medication Name: Nebulizer for albuterol. Dx. Severe COPD.  Please see separate Rx for albuterol soln. 1 Units 0  . aspirin 81 MG tablet Take 81 mg by mouth daily.      . Calcium-Vitamin D (CALTRATE 600 PLUS-VIT D PO) Take 1 tablet by mouth daily.     . cholecalciferol (VITAMIN D) 1000 units tablet Take 1,000 Units by mouth daily.    . cyclobenzaprine (FLEXERIL) 5 MG tablet Take 5 mg by mouth 3 (three) times daily as  needed for muscle spasms.    . diclofenac sodium (VOLTAREN) 1 % GEL APPLY 2 GRAMS TOPICALLY FOUR TIMES A DAY. CAN APPLY TO SHOULDERS OR KNEES AS NEEDED.  99  . Fluticasone-Umeclidin-Vilant (TRELEGY ELLIPTA) 100-62.5-25 MCG/INH AEPB Inhale 1 puff into the lungs daily. 60 each 11  . glucose blood test strip TEST 2 TIMES A DAY AS DIRECTED 100 each PRN  . Insulin Glargine (LANTUS SOLOSTAR) 100 UNIT/ML Solostar Pen Inject 12 Units into the skin at bedtime. 5 pen 6  . Lidocaine HCl 3 % LOTN Apply topically as needed.    . Multiple Vitamin (MULTIVITAMIN) tablet Take 1 tablet by mouth daily.      . naproxen (NAPROSYN) 500 MG tablet TAKE ONE TABLET BY MOUTH 2 TIMES A DAY WITH A MEAL AS NEEDED FOR JAW AND EAR PAIN 60 tablet 3  . nitroGLYCERIN (NITROSTAT) 0.4 MG SL tablet Place 1 tablet (0.4 mg total) under the tongue every 5 (five) minutes as needed. For chest pain 30 tablet 0  . QC LANCETS SUPER THIN MISC TEST TWICE DAILY 100 each 2  . sertraline (ZOLOFT) 100 MG tablet TAKE 1 & 1/2 TABLETS BY MOUTH EVERY DAY 45 tablet 11  . simvastatin (ZOCOR) 20 MG tablet TAKE ONE TABLET BY MOUTH DAILY AT 6PM 30 tablet 11  . SYNTHROID 75 MCG tablet TAKE ONE TABLET BY MOUTH EVERY DAY BEFORE BREAKFAST (Patient taking differently: 1 tablet daily except  for 1.5 tablet on Sunday.) 30 tablet 6  . UNIFINE PENTIPS 29G X 12MM MISC USE ONCE DAILY AS DIRECTED 100 each 11   No current facility-administered medications for this visit.     Functional Status:  In your present state of health, do you have any difficulty performing the following activities: 06/23/2018  Hearing? Y  Comment can't get hearing aids for another 2 years  Vision? N  Comment readers  Difficulty concentrating or making decisions? Y  Walking or climbing stairs? Y  Dressing or bathing? N  Doing errands, shopping? N  Preparing Food and eating ? N  Using the Toilet? N  In the past six months, have you accidently leaked urine? N  Do you have problems with  loss of bowel control? N  Managing your Medications? N  Managing your Finances? N  Housekeeping or managing your Housekeeping? N  Some recent data might be hidden    Fall/Depression Screening: Fall Risk  11/25/2018 07/08/2018 06/23/2018  Falls in the past year? 0 Yes Yes  Number falls in past yr: - 1 2 or more  Injury with Fall? - Yes Yes  Comment - - -  Risk Factor Category  - - High Fall Risk  Risk for fall due to : - - History of fall(s);Impaired balance/gait  Follow up - Falls evaluation completed Falls prevention discussed   PHQ 2/9 Scores 06/23/2018 06/09/2018 07/24/2017 07/22/2017 07/16/2017 01/20/2017 12/10/2016  PHQ - 2 Score 1 0 2 4 - 0 0  PHQ- 9 Score - - 8 12 - - -  Exception Documentation - - - - Other- indicate reason in comment box - -  Not completed - - - - Daughter answered screening questions on patient's behalf.  - -    Assessment: Patient will continue to benefit from health coach outreach for disease management and support. THN CM Care Plan Problem One     Most Recent Value  THN Long Term Goal   In 60 days the [facility] will verbalize the patient has remained hospital free  [place] Term Goal Start Date  11/25/18  Interventions for Problem One Long Term Goal  Discussed corona virus precaustions, signs and ssymptoms of copd, action plan, encouraged drinking water  and medication adherenc    ' Plan: [place] will contact patient in the month of May and patient agrees to next outreach.   Lazaro Arms RN, BSN, [place] Direct Dial:  [phone]  Fax: [phone]

## 2018-11-27 DIAGNOSIS — J449 Chronic obstructive pulmonary disease, unspecified: Secondary | ICD-10-CM | POA: Diagnosis not present

## 2018-11-30 DIAGNOSIS — N3941 Urge incontinence: Secondary | ICD-10-CM | POA: Diagnosis not present

## 2018-11-30 DIAGNOSIS — E119 Type 2 diabetes mellitus without complications: Secondary | ICD-10-CM | POA: Diagnosis not present

## 2018-12-07 ENCOUNTER — Other Ambulatory Visit: Payer: Self-pay | Admitting: Family Medicine

## 2018-12-07 DIAGNOSIS — Q348 Other specified congenital malformations of respiratory system: Secondary | ICD-10-CM | POA: Diagnosis not present

## 2018-12-07 DIAGNOSIS — J45909 Unspecified asthma, uncomplicated: Secondary | ICD-10-CM | POA: Diagnosis not present

## 2018-12-14 DIAGNOSIS — J9611 Chronic respiratory failure with hypoxia: Secondary | ICD-10-CM | POA: Diagnosis not present

## 2018-12-14 DIAGNOSIS — J449 Chronic obstructive pulmonary disease, unspecified: Secondary | ICD-10-CM | POA: Diagnosis not present

## 2018-12-17 DIAGNOSIS — M25512 Pain in left shoulder: Secondary | ICD-10-CM | POA: Diagnosis not present

## 2018-12-17 DIAGNOSIS — E119 Type 2 diabetes mellitus without complications: Secondary | ICD-10-CM | POA: Diagnosis not present

## 2018-12-17 DIAGNOSIS — G8929 Other chronic pain: Secondary | ICD-10-CM | POA: Diagnosis not present

## 2018-12-17 DIAGNOSIS — M79602 Pain in left arm: Secondary | ICD-10-CM | POA: Diagnosis not present

## 2018-12-17 DIAGNOSIS — M25522 Pain in left elbow: Secondary | ICD-10-CM | POA: Diagnosis not present

## 2018-12-28 DIAGNOSIS — J449 Chronic obstructive pulmonary disease, unspecified: Secondary | ICD-10-CM | POA: Diagnosis not present

## 2019-01-07 DIAGNOSIS — J45909 Unspecified asthma, uncomplicated: Secondary | ICD-10-CM | POA: Diagnosis not present

## 2019-01-07 DIAGNOSIS — Q348 Other specified congenital malformations of respiratory system: Secondary | ICD-10-CM | POA: Diagnosis not present

## 2019-01-13 ENCOUNTER — Other Ambulatory Visit: Payer: Self-pay

## 2019-01-13 NOTE — Patient Outreach (Signed)
[facility]) Care Management  01/13/2019  Theresa Huang Oct 06, 1930 [id number]    Telephone Screen Referral Date : 01/13/19 Referral Source: Patient Survey Referral Reason: Bed rails Insurance: [place] attempt # 1 To patient caregiver.  No answer.  HIPAA compliant voicemail left with contact information.  Plan: RN Health Coach will send letter.  RN Health Coach will make an outreach attempt to the patient within four business days.     Lazaro Arms RN, BSN, [place] Direct Dial:  [phone] Fax: [phone]

## 2019-01-14 ENCOUNTER — Other Ambulatory Visit: Payer: Self-pay

## 2019-01-14 DIAGNOSIS — J449 Chronic obstructive pulmonary disease, unspecified: Secondary | ICD-10-CM | POA: Diagnosis not present

## 2019-01-14 DIAGNOSIS — J9611 Chronic respiratory failure with hypoxia: Secondary | ICD-10-CM | POA: Diagnosis not present

## 2019-01-14 NOTE — Patient Outreach (Signed)
[facility]) Care Management  01/14/2019  Theresa Huang Jan 29, 1931 [id number]    Telephone Screen Referral Date : 01/13/19 Referral Source: Patient Survey Referral Reason: Bed rails Insurance: [facility]  Outreach # 2 Theresa Huang (caregiver on ROI) returned the call.  She was able to provide two patient identifiers.  She states that the patient has fallen three times in the last two week. Two times she lost balance and the third time she slid out of the bed. She states that she did not injure herself but has a bruise on her left leg.  Discussed safety precautions with caregiver and she verbalized understanding. She states that she is not using her walker.  Caregiver states that she has talked with the patient about this. Caregiver states that they have gone and purchased rails to go on her bed and will be putting them on today.  Patient has a bedside commode in the room but refuses to use it.  Advise the caregiver to put the commode close to her bed.  She states that she will because it had been sitting across the room.  Caregiver states that she has purchased rails to go on the bed and they will be put on today.  She has also purchased a monitor to be able to hear what is going on in the room or if her mother calls out for her.  Plan RN Health Coach will outreach the patient at the next scheduled interval.  Lazaro Arms RN, BSN, [place]:  [phone]  Fax: [phone]

## 2019-01-15 ENCOUNTER — Ambulatory Visit: Payer: Self-pay

## 2019-01-19 DIAGNOSIS — G8929 Other chronic pain: Secondary | ICD-10-CM | POA: Diagnosis not present

## 2019-01-19 DIAGNOSIS — M25512 Pain in left shoulder: Secondary | ICD-10-CM | POA: Diagnosis not present

## 2019-01-19 DIAGNOSIS — E119 Type 2 diabetes mellitus without complications: Secondary | ICD-10-CM | POA: Diagnosis not present

## 2019-01-19 DIAGNOSIS — N3941 Urge incontinence: Secondary | ICD-10-CM | POA: Diagnosis not present

## 2019-01-19 DIAGNOSIS — Z79899 Other long term (current) drug therapy: Secondary | ICD-10-CM | POA: Diagnosis not present

## 2019-01-26 DIAGNOSIS — M25522 Pain in left elbow: Secondary | ICD-10-CM | POA: Diagnosis not present

## 2019-01-26 DIAGNOSIS — M6281 Muscle weakness (generalized): Secondary | ICD-10-CM | POA: Diagnosis not present

## 2019-01-26 DIAGNOSIS — M25512 Pain in left shoulder: Secondary | ICD-10-CM | POA: Diagnosis not present

## 2019-01-26 DIAGNOSIS — M25612 Stiffness of left shoulder, not elsewhere classified: Secondary | ICD-10-CM | POA: Diagnosis not present

## 2019-01-26 DIAGNOSIS — R293 Abnormal posture: Secondary | ICD-10-CM | POA: Diagnosis not present

## 2019-01-27 DIAGNOSIS — J449 Chronic obstructive pulmonary disease, unspecified: Secondary | ICD-10-CM | POA: Diagnosis not present

## 2019-01-28 ENCOUNTER — Other Ambulatory Visit: Payer: Self-pay

## 2019-01-28 NOTE — Patient Outreach (Signed)
[facility]) Care Management  01/28/2019  MEIYA WISLER 20-Jul-1931 [id number]   1st outreach attempt to the patient for assessment.  No answer.  HIPAA compliant voicemail left with contact information.  Plan: RN Health Coach will send letter. [place] will make outreach attempt to the patient within thirty business days.   Lazaro Arms RN, BSN, [place] Direct Dial:  [phone]  Fax: [phone]

## 2019-02-04 DIAGNOSIS — E119 Type 2 diabetes mellitus without complications: Secondary | ICD-10-CM | POA: Diagnosis not present

## 2019-02-04 DIAGNOSIS — W19XXXA Unspecified fall, initial encounter: Secondary | ICD-10-CM | POA: Diagnosis not present

## 2019-02-04 DIAGNOSIS — M818 Other osteoporosis without current pathological fracture: Secondary | ICD-10-CM | POA: Diagnosis not present

## 2019-02-04 DIAGNOSIS — S42402B Unspecified fracture of lower end of left humerus, initial encounter for open fracture: Secondary | ICD-10-CM | POA: Diagnosis not present

## 2019-02-04 DIAGNOSIS — M25522 Pain in left elbow: Secondary | ICD-10-CM | POA: Diagnosis not present

## 2019-02-04 DIAGNOSIS — S42495A Other nondisplaced fracture of lower end of left humerus, initial encounter for closed fracture: Secondary | ICD-10-CM | POA: Diagnosis not present

## 2019-02-12 ENCOUNTER — Other Ambulatory Visit: Payer: Self-pay

## 2019-02-12 NOTE — Patient Outreach (Signed)
[facility]) Care Management  02/12/2019  [name] 31-Oct-1930 [id number]    Care coordination call  Date: 02/12/19 Source: Care giver  Reason: wanting information about a hospital bed Insurance:  [facility] attempt # 1 unsuccessful.  No answer.  HIPAA compliant voicemail left with contact information.   Plan: RN Health Coach will out reach the patient in the next four business day.   Lazaro Arms RN, BSN, [place] Direct Dial:  [phone] Fax: [phone]

## 2019-02-13 DIAGNOSIS — J449 Chronic obstructive pulmonary disease, unspecified: Secondary | ICD-10-CM | POA: Diagnosis not present

## 2019-02-13 DIAGNOSIS — J9611 Chronic respiratory failure with hypoxia: Secondary | ICD-10-CM | POA: Diagnosis not present

## 2019-02-15 ENCOUNTER — Other Ambulatory Visit: Payer: Self-pay

## 2019-02-15 ENCOUNTER — Other Ambulatory Visit: Payer: Self-pay | Admitting: Family Medicine

## 2019-02-15 NOTE — Patient Outreach (Signed)
[facility]) Care Management  02/15/2019  ELICE CRIGGER [birth year] [id number]    Care coordination call  Date: 02/15/19 Source: Care giver  Reason: wanting information about a hospital bed Insurance:  [facility]  Outreach attempt # 2  [name] answered the phone and stated that she was unable to talk at the time. She was in the dentist office and would give me a call back.  Plan: RN Health Coach will out reach the patient in the next four business day.  Lazaro Arms RN, BSN, [place] Direct Dial:  [phone]  Fax: [phone]

## 2019-02-18 ENCOUNTER — Other Ambulatory Visit: Payer: Self-pay

## 2019-02-18 NOTE — Patient Outreach (Signed)
[facility]) Care Management  02/18/2019  CHRISTIEN BERTHELOT 06/25/1931 [id number]    Care coordination call  Date: 02/18/19 Source:Care giver Reason:wanting information about a hospital bed Insurance:[employer]  Outreach attempt # 3 to the patient.  No answer.  HIPAA compliant voicemail left with contact information.  Plan:  RN Health coach has made three attempt to outreach the patient and sent a letter.  RN Health Coach will outreach the patient at the next scheduled interval.  . Lazaro Arms RN, BSN, [place]:  [phone]  Fax: [phone]

## 2019-02-18 NOTE — Patient Outreach (Signed)
[facility]) Care Management  02/18/2019  TAIWAN MILLON Apr 08, 1931 [id number]    Care coordination call  Date:02/18/19 Source:Care giver Reason:wanting information about a hospital bed Insurance:[employer]  Return call from [place] ( ROI).  Two patient identifiers obtained.  [name] (on ROI) her caregiver states that the patient fell and broke her elbow and they would like to get a hospital bed.  Advised caregiver to call her physician office and request them to send an order for the bed to a DME company.  She states that she would.  Discussed fall precautions and caregiver states they have a bedside commode next to her bed, they have put some rails on the bed that they purchased, and she has a baby monitor in the room for her needs.  Caregiver states if she does get out of the bed that her or another family member will walk with her.  Caregiver states she is not having any signs or symptoms of COPD and is the green zone.   She state that all of her medications are the same but she has a prescription of Tramadol 50 mg every 6 hour prn.  [name] while on the phone let me know that they have switch primary care physicians.  Mrs. Ernsberger currently see Dr Corena Pilgrim with [facility].  Unfortunately, he is not in our network and I will no longer be able to outreach her.  [name] has been notified and thanked me for our services.  Plan:  RN Health Coach will close the case at this time due to the patient no longer meets program criteria.

## 2019-02-19 DIAGNOSIS — M25522 Pain in left elbow: Secondary | ICD-10-CM | POA: Diagnosis not present

## 2019-02-19 DIAGNOSIS — E119 Type 2 diabetes mellitus without complications: Secondary | ICD-10-CM | POA: Diagnosis not present

## 2019-02-19 DIAGNOSIS — Z6823 Body mass index (BMI) 23.0-23.9, adult: Secondary | ICD-10-CM | POA: Diagnosis not present

## 2019-02-19 DIAGNOSIS — K644 Residual hemorrhoidal skin tags: Secondary | ICD-10-CM | POA: Diagnosis not present

## 2019-02-25 ENCOUNTER — Other Ambulatory Visit: Payer: Self-pay | Admitting: Family Medicine

## 2019-02-25 DIAGNOSIS — M25532 Pain in left wrist: Secondary | ICD-10-CM | POA: Diagnosis not present

## 2019-02-25 DIAGNOSIS — E118 Type 2 diabetes mellitus with unspecified complications: Secondary | ICD-10-CM

## 2019-02-25 DIAGNOSIS — Z794 Long term (current) use of insulin: Secondary | ICD-10-CM

## 2019-02-25 DIAGNOSIS — S42492K Other displaced fracture of lower end of left humerus, subsequent encounter for fracture with nonunion: Secondary | ICD-10-CM | POA: Diagnosis not present

## 2019-02-27 DIAGNOSIS — J449 Chronic obstructive pulmonary disease, unspecified: Secondary | ICD-10-CM | POA: Diagnosis not present

## 2019-03-02 ENCOUNTER — Ambulatory Visit: Payer: Self-pay

## 2019-03-02 DIAGNOSIS — E119 Type 2 diabetes mellitus without complications: Secondary | ICD-10-CM | POA: Diagnosis not present

## 2019-03-02 DIAGNOSIS — N3941 Urge incontinence: Secondary | ICD-10-CM | POA: Diagnosis not present

## 2019-03-03 DIAGNOSIS — S0101XA Laceration without foreign body of scalp, initial encounter: Secondary | ICD-10-CM | POA: Diagnosis not present

## 2019-03-03 DIAGNOSIS — I10 Essential (primary) hypertension: Secondary | ICD-10-CM | POA: Diagnosis not present

## 2019-03-03 DIAGNOSIS — R404 Transient alteration of awareness: Secondary | ICD-10-CM | POA: Diagnosis not present

## 2019-03-03 DIAGNOSIS — M47812 Spondylosis without myelopathy or radiculopathy, cervical region: Secondary | ICD-10-CM | POA: Diagnosis not present

## 2019-03-03 DIAGNOSIS — N189 Chronic kidney disease, unspecified: Secondary | ICD-10-CM | POA: Diagnosis not present

## 2019-03-03 DIAGNOSIS — I6782 Cerebral ischemia: Secondary | ICD-10-CM | POA: Diagnosis not present

## 2019-03-03 DIAGNOSIS — W01198A Fall on same level from slipping, tripping and stumbling with subsequent striking against other object, initial encounter: Secondary | ICD-10-CM | POA: Diagnosis not present

## 2019-03-03 DIAGNOSIS — M5030 Other cervical disc degeneration, unspecified cervical region: Secondary | ICD-10-CM | POA: Diagnosis not present

## 2019-03-03 DIAGNOSIS — R0902 Hypoxemia: Secondary | ICD-10-CM | POA: Diagnosis not present

## 2019-03-03 DIAGNOSIS — M4802 Spinal stenosis, cervical region: Secondary | ICD-10-CM | POA: Diagnosis not present

## 2019-03-03 DIAGNOSIS — E1122 Type 2 diabetes mellitus with diabetic chronic kidney disease: Secondary | ICD-10-CM | POA: Diagnosis not present

## 2019-03-03 DIAGNOSIS — I129 Hypertensive chronic kidney disease with stage 1 through stage 4 chronic kidney disease, or unspecified chronic kidney disease: Secondary | ICD-10-CM | POA: Diagnosis not present

## 2019-03-03 DIAGNOSIS — N289 Disorder of kidney and ureter, unspecified: Secondary | ICD-10-CM | POA: Diagnosis not present

## 2019-03-03 DIAGNOSIS — R410 Disorientation, unspecified: Secondary | ICD-10-CM | POA: Diagnosis not present

## 2019-03-03 DIAGNOSIS — S0003XA Contusion of scalp, initial encounter: Secondary | ICD-10-CM | POA: Diagnosis not present

## 2019-03-03 DIAGNOSIS — S0990XA Unspecified injury of head, initial encounter: Secondary | ICD-10-CM | POA: Diagnosis not present

## 2019-03-03 DIAGNOSIS — R58 Hemorrhage, not elsewhere classified: Secondary | ICD-10-CM | POA: Diagnosis not present

## 2019-03-03 DIAGNOSIS — I4510 Unspecified right bundle-branch block: Secondary | ICD-10-CM | POA: Diagnosis not present

## 2019-03-03 DIAGNOSIS — Z794 Long term (current) use of insulin: Secondary | ICD-10-CM | POA: Diagnosis not present

## 2019-03-03 DIAGNOSIS — Y998 Other external cause status: Secondary | ICD-10-CM | POA: Diagnosis not present

## 2019-03-04 DIAGNOSIS — I4510 Unspecified right bundle-branch block: Secondary | ICD-10-CM | POA: Diagnosis not present

## 2019-03-05 DIAGNOSIS — W19XXXA Unspecified fall, initial encounter: Secondary | ICD-10-CM | POA: Diagnosis not present

## 2019-03-05 DIAGNOSIS — R42 Dizziness and giddiness: Secondary | ICD-10-CM | POA: Diagnosis not present

## 2019-03-05 DIAGNOSIS — Y92009 Unspecified place in unspecified non-institutional (private) residence as the place of occurrence of the external cause: Secondary | ICD-10-CM | POA: Diagnosis not present

## 2019-03-05 DIAGNOSIS — E119 Type 2 diabetes mellitus without complications: Secondary | ICD-10-CM | POA: Diagnosis not present

## 2019-03-12 DIAGNOSIS — R0602 Shortness of breath: Secondary | ICD-10-CM | POA: Diagnosis not present

## 2019-03-12 DIAGNOSIS — Z6820 Body mass index (BMI) 20.0-20.9, adult: Secondary | ICD-10-CM | POA: Diagnosis not present

## 2019-03-12 DIAGNOSIS — E119 Type 2 diabetes mellitus without complications: Secondary | ICD-10-CM | POA: Diagnosis not present

## 2019-03-12 DIAGNOSIS — S0101XD Laceration without foreign body of scalp, subsequent encounter: Secondary | ICD-10-CM | POA: Diagnosis not present

## 2019-03-16 DIAGNOSIS — J449 Chronic obstructive pulmonary disease, unspecified: Secondary | ICD-10-CM | POA: Diagnosis not present

## 2019-03-16 DIAGNOSIS — J9611 Chronic respiratory failure with hypoxia: Secondary | ICD-10-CM | POA: Diagnosis not present

## 2019-03-22 ENCOUNTER — Other Ambulatory Visit: Payer: Self-pay | Admitting: Family Medicine

## 2019-03-27 ENCOUNTER — Other Ambulatory Visit: Payer: Self-pay | Admitting: Family Medicine

## 2019-03-28 ENCOUNTER — Other Ambulatory Visit: Payer: Self-pay | Admitting: Family Medicine

## 2019-03-28 ENCOUNTER — Other Ambulatory Visit: Payer: Self-pay | Admitting: Sports Medicine

## 2019-03-29 ENCOUNTER — Other Ambulatory Visit: Payer: Self-pay | Admitting: Sports Medicine

## 2019-03-29 ENCOUNTER — Other Ambulatory Visit: Payer: Self-pay | Admitting: Family Medicine

## 2019-04-13 ENCOUNTER — Other Ambulatory Visit: Payer: Self-pay | Admitting: Family Medicine

## 2019-04-19 ENCOUNTER — Other Ambulatory Visit: Payer: Self-pay | Admitting: Family Medicine

## 2019-05-08 ENCOUNTER — Other Ambulatory Visit: Payer: Self-pay | Admitting: Family Medicine

## 2019-05-13 ENCOUNTER — Other Ambulatory Visit: Payer: Self-pay | Admitting: Family Medicine

## 2019-05-25 ENCOUNTER — Other Ambulatory Visit: Payer: Self-pay

## 2019-05-25 ENCOUNTER — Ambulatory Visit (INDEPENDENT_AMBULATORY_CARE_PROVIDER_SITE_OTHER): Payer: Medicare HMO | Admitting: Family Medicine

## 2019-05-25 ENCOUNTER — Encounter: Payer: Self-pay | Admitting: Family Medicine

## 2019-05-25 VITALS — BP 132/61 | HR 94 | Temp 98.7°F | Ht 64.0 in | Wt 127.0 lb

## 2019-05-25 DIAGNOSIS — Z23 Encounter for immunization: Secondary | ICD-10-CM

## 2019-05-25 DIAGNOSIS — Z794 Long term (current) use of insulin: Secondary | ICD-10-CM | POA: Diagnosis not present

## 2019-05-25 DIAGNOSIS — F0280 Dementia in other diseases classified elsewhere without behavioral disturbance: Secondary | ICD-10-CM

## 2019-05-25 DIAGNOSIS — Z993 Dependence on wheelchair: Secondary | ICD-10-CM | POA: Insufficient documentation

## 2019-05-25 DIAGNOSIS — F339 Major depressive disorder, recurrent, unspecified: Secondary | ICD-10-CM

## 2019-05-25 DIAGNOSIS — G301 Alzheimer's disease with late onset: Secondary | ICD-10-CM

## 2019-05-25 DIAGNOSIS — E118 Type 2 diabetes mellitus with unspecified complications: Secondary | ICD-10-CM

## 2019-05-25 LAB — POCT GLYCOSYLATED HEMOGLOBIN (HGB A1C): Hemoglobin A1C: 5.8 % — AB (ref 4.0–5.6)

## 2019-05-25 MED ORDER — SERTRALINE HCL 50 MG PO TABS: ORAL_TABLET | ORAL | 0 refills | Status: DC

## 2019-05-25 MED ORDER — AMBULATORY NON FORMULARY MEDICATION: 0 refills | Status: DC

## 2019-05-25 NOTE — Assessment & Plan Note (Signed)
Hemoglobin A1c looks great today at 5.8.  Okay to decrease Lantus down to 12 units since she is had a couple hypoglycemic events.

## 2019-05-25 NOTE — Progress Notes (Signed)
Established Patient Office Visit  Subjective:  Patient ID: Theresa Huang, female    DOB: [birth year]  Age: 85 y.o. MRN: [id number]  CC:  Chief Complaint  Patient presents with  . Diabetes    HPI [name] presents   To reestablish care.  She has been seeing a different PCP for the last year.  Her daughter who is here with her today let me know that she did take a bad fall back in March and hit her head and ever since then she has been nonambulatory she is basically been wheelchair-bound.  She is been only minimally verbal and engaged.  Normally she would spend her days doing puzzles and now just sits and stares out.  Her daughter reports that she now needs assistance with dressing, bathing and even toileting.  She needs assistance getting to the potty chair.  The daughters husband is able to help her some, but he himself has had multiple strokes.  She reports that her mom is scheduled for an MRI later this week.  She denies any wounds or pressure ulcers.  They have just gotten some bed rails and make sure that she does not fall or roll out of bed.  And they have been using a baby monitor to keep an eye on her.  Her daughter reports that they are in need of a hospital bed.  Diabetes - no hypoglycemic events. No wounds or sores that are not healing well. No increased thirst or urination. Checking glucose at home. Taking medications as prescribed without any side effects.  She has been using 16 units of Lantus she has had a couple of lows the lowest being 61 which was about a month ago.  Her bowels are moving normally she has a bowel movement about every other day.   Dementia - she is now on Aricept.    Past Medical History:  Diagnosis Date  . Anemia   . Cancer ([place])    SCC  . Carotid artery occlusion   . COPD (chronic obstructive pulmonary disease) (HCC)    wears oxygen at 3L prn daytime and 3L at night   . DDD (degenerative disc disease)   . Diabetes mellitus (HCC)     fasting blood sugar 80-100  . Diastolic dysfunction   . Dizziness   . Glaucoma   . Headache(784.0)    migranes  . Hearing loss of both ears    wears bilateral hearing aids  . Hemorrhoids   . Holter monitor, abnormal    wearing for 21 days  . Hyperlipidemia   . Hypothyroid   . Inflammatory polyps of colon with rectal bleeding ([place])   . Irregular heart beat   . Myocardial infarction ([place]) 2006  . Pneumonia    hx  . Retinopathy of left eye   . Shortness of breath    most of time  . Syncope     Past Surgical History:  Procedure Laterality Date  . ABDOMINAL HYSTERECTOMY    . CAROTID ENDARTERECTOMY    . COLONOSCOPY W/ POLYPECTOMY    . ENDARTERECTOMY  09/01/2012   Procedure: ENDARTERECTOMY CAROTID;  Surgeon: Conrad Bear Rocks, MD;  Location: [facility];  Service: Vascular;  Laterality: Right;  . EYE SURGERY     cataracts  . HEMORRHOID SURGERY    . PATCH ANGIOPLASTY  09/01/2012   Procedure: PATCH ANGIOPLASTY;  Surgeon: Conrad Plymouth, MD;  Location: [facility];  Service: Vascular;  Laterality: Right;  Vascu-Guard Patch Angioplasty  .  squamous cell removed    . THROAT SURGERY    . TONSILLECTOMY      Family History  Problem Relation Age of Onset  . Depression Mother   . Diabetes Mother   . CAD Mother   . Heart disease Mother   . Hyperlipidemia Daughter   . CAD Father   . Heart disease Father   . Diabetes Brother   . Hypertension Son   . Breast cancer Daughter     Social History   Socioeconomic History  . Marital status: Widowed    Spouse name: Not on file  . Number of children: 4  . Years of education: 81  . Highest education level: 12th grade  Occupational History    Employer: RETIRED    Comment: nursing  Social Needs  . Financial resource strain: Not very hard  . Food insecurity    Worry: Never true    Inability: Never true  . Transportation needs    Medical: No    Non-medical: No  Tobacco Use  . Smoking status: Former Smoker    Types: Cigarettes    Quit date:  09/17/1987    Years since quitting: 31.7  . Smokeless tobacco: Never Used  Substance and Sexual Activity  . Alcohol use: No  . Drug use: No  . Sexual activity: Not Currently  Lifestyle  . Physical activity    Days per week: 0 days    Minutes per session: 0 min  . Stress: Only a little  Relationships  . Social Herbalist on phone: Twice a week    Gets together: Never    Attends religious service: Never    Active member of club or organization: No    Attends meetings of clubs or organizations: Never    Relationship status: Widowed  . Intimate partner violence    Fear of current or ex partner: No    Emotionally abused: No    Physically abused: No    Forced sexual activity: No  Other Topics Concern  . Not on file  Social History Narrative   Lives with daughter, doesn't go outside much because of weeds and snakes. Does do puzzles a lot which stimulates the brain.    Outpatient Medications Prior to Visit  Medication Sig Dispense Refill  . amantadine (SYMMETREL) 50 MG/5ML solution TAKE 5ML BY MOUTH DAILY    . amLODipine (NORVASC) 2.5 MG tablet Take 2.5 mg by mouth daily.    [name] donepezil (ARICEPT) 5 MG tablet Take 1 tablet by mouth at bedtime.    . insulin aspart (NOVOLOG) 100 UNIT/ML injection Inject into the skin.    [name] acetaminophen (TYLENOL ARTHRITIS PAIN) 650 MG CR tablet Take 650 mg by mouth every 8 (eight) hours as needed. For pain    . albuterol (ACCUNEB) 1.25 MG/3ML nebulizer solution Take 3 mLs (1.25 mg total) by nebulization every 6 (six) hours as needed for wheezing. Please fax to [facility]. 75 mL 12  . albuterol (PROVENTIL HFA;VENTOLIN HFA) 108 (90 Base) MCG/ACT inhaler INHALE 2 PUFFS INTO THE LUNGS EVERY 4 HOURS AS NEEDED FOR WHEEZING 18 g 3  . alendronate (FOSAMAX) 70 MG tablet TAKE ONE TABLET ONCE A WEEK 4 tablet 12  . ALLERGY RELIEF 10 MG tablet TAKE 1 TABLET BY MOUTH ONCE DAILY 30 tablet 11  . AMBULATORY NON FORMULARY MEDICATION Medication Name: glucometer strips  to test BID.  Dx. 250.00 100 Units PRN  . AMBULATORY NON FORMULARY MEDICATION Medication Name: Portable oxygen -  2 liters. Dx. COPD.  Send to [facility]. 1 vial 0  . aspirin 81 MG tablet Take 81 mg by mouth daily.      . Calcium-Vitamin D (CALTRATE 600 PLUS-VIT D PO) Take 1 tablet by mouth daily.     . cholecalciferol (VITAMIN D) 1000 units tablet Take 1,000 Units by mouth daily.    . cyclobenzaprine (FLEXERIL) 5 MG tablet Take 5 mg by mouth 3 (three) times daily as needed for muscle spasms.    . Fluticasone-Umeclidin-Vilant (TRELEGY ELLIPTA) 100-62.5-25 MCG/INH AEPB Inhale 1 puff into the lungs daily. 60 each 11  . Insulin Glargine (LANTUS SOLOSTAR) 100 UNIT/ML Solostar Pen Inject 12 Units into the skin at bedtime. 5 pen 6  . Lidocaine HCl 3 % LOTN Apply topically as needed.    . Multiple Vitamin (MULTIVITAMIN) tablet Take 1 tablet by mouth daily.      . nitroGLYCERIN (NITROSTAT) 0.4 MG SL tablet Place 1 tablet (0.4 mg total) under the tongue every 5 (five) minutes as needed. For chest pain 30 tablet 0  . QC LANCETS SUPER THIN MISC TEST TWICE DAILY 100 each 2  . simvastatin (ZOCOR) 20 MG tablet TAKE ONE TABLET BY MOUTH DAILY AT 6PM 30 tablet 11  . SYNTHROID 75 MCG tablet Take 1 tablet (75 mcg total) by mouth daily before breakfast. 30 day supply given MUST SCHEDULE AND KEEP APPOINTMENT FOR REFILLS 30 tablet 0  . AMBULATORY NON FORMULARY MEDICATION Medication Name: Nebulizer for albuterol. Dx. Severe COPD.  Please see separate Rx for albuterol soln. 1 Units 0  . diclofenac sodium (VOLTAREN) 1 % GEL APPLY 2 GRAMS TOPICALLY FOUR TIMES A DAY. CAN APPLY TO SHOULDERS OR KNEES AS NEEDED.  99  . glucose blood test strip TEST 2 TIMES A DAY AS DIRECTED 100 each PRN  . naproxen (NAPROSYN) 500 MG tablet TAKE ONE TABLET BY MOUTH 2 TIMES A DAY WITH A MEAL AS NEEDED FOR JAW AND EAR PAIN 60 tablet 3  . sertraline (ZOLOFT) 100 MG tablet TAKE 1 & 1/2 TABLETS BY MOUTH EVERY DAY 45 tablet 11  . traMADol (ULTRAM) 50 MG  tablet Take by mouth every 6 (six) hours as needed.    [name] UNIFINE PENTIPS 29G X 12MM MISC USE ONCE DAILY AS DIRECTED 100 each 11   No facility-administered medications prior to visit.     Allergies  Allergen Reactions  . Bee Venom Anaphylaxis  . Other Anaphylaxis  . Tetracyclines & Related Anaphylaxis  . Tetracycline     REACTION: arms swelling, itching, blisters  . Tramadol Other (See Comments)    Excess sedation     ROS Review of Systems    Objective:    Physical Exam  Constitutional: She is oriented to person, place, and time. She appears well-developed and well-nourished.  HENT:  Head: Normocephalic and atraumatic.  Cardiovascular: Normal rate, regular rhythm and normal heart sounds.  No carotid bruits  Pulmonary/Chest: Effort normal and breath sounds normal.  Musculoskeletal:        General: No edema.  Neurological: She is alert and oriented to person, place, and time.  Skin: Skin is warm and dry.  Psychiatric: She has a normal mood and affect. Her behavior is normal.    BP 132/61   Pulse 94   Temp 98.7 F (37.1 C)   Ht 5\' 4"  (1.626 m)   Wt 127 lb (57.6 kg)   SpO2 93%   BMI 21.80 kg/m  Wt Readings from Last 3 Encounters:  05/25/19  127 lb (57.6 kg)  06/23/18 140 lb (63.5 kg)  06/09/18 142 lb (64.4 kg)     Health Maintenance Due  Topic Date Due  . URINE MICROALBUMIN  12/10/2017  . OPHTHALMOLOGY EXAM  03/06/2019  . FOOT EXAM  03/11/2019    There are no preventive care reminders to display for this patient.  Lab Results  Component Value Date   TSH 4.79 (H) 01/20/2017   Lab Results  Component Value Date   WBC 6.5 12/27/2014   HGB 12.0 12/27/2014   HCT 40.6 07/12/2014   MCV 92.5 07/12/2014   PLT 219 12/27/2014   Lab Results  Component Value Date   NA 139 06/10/2018   K 4.9 06/10/2018   CO2 32 06/10/2018   GLUCOSE 142 (H) 06/10/2018   BUN 33 (H) 06/10/2018   CREATININE 1.57 (H) 06/10/2018   BILITOT 0.5 06/10/2018   ALKPHOS 60 07/22/2016    AST 15 06/10/2018   ALT 10 06/10/2018   PROT 6.7 06/10/2018   ALBUMIN 3.8 07/22/2016   CALCIUM 10.0 06/10/2018   Lab Results  Component Value Date   CHOL 169 06/10/2018   Lab Results  Component Value Date   HDL 61 06/10/2018   Lab Results  Component Value Date   LDLCALC 89 06/10/2018   Lab Results  Component Value Date   TRIG 91 06/10/2018   Lab Results  Component Value Date   CHOLHDL 2.8 06/10/2018   Lab Results  Component Value Date   HGBA1C 5.8 (A) 05/25/2019      Assessment & Plan:   Problem List Items Addressed This Visit      Endocrine   Type 2 diabetes mellitus with complication, with long-term current use of insulin (HCC) - Primary    Hemoglobin A1c looks great today at 5.8.  Okay to decrease Lantus down to 12 units since she is had a couple hypoglycemic events.      Relevant Medications   insulin aspart (NOVOLOG) 100 UNIT/ML injection   Other Relevant Orders   POCT glycosylated hemoglobin (Hb A1C) (Completed)     Nervous and Auditory   Dementia (HCC)    Currently on Aricept.Evaluate at next f/u for effectiveness. Plan to d.c if not helpful      Relevant Medications   amantadine (SYMMETREL) 50 MG/5ML solution   donepezil (ARICEPT) 5 MG tablet   sertraline (ZOLOFT) 50 MG tablet     Other   Wheelchair dependence    Ahead and try to get a hospital bed ordered for her.      Relevant Medications   AMBULATORY NON FORMULARY MEDICATION   Depression    Her daughter would like for [place] to retry sertaline which she used to be on for depression. 3 month trial. If not helping will discontinue.       Relevant Medications   sertraline (ZOLOFT) 50 MG tablet    Other Visit Diagnoses    Need for immunization against influenza       Relevant Orders   Flu Vaccine QUAD High Dose(Fluad) (Completed)      Meds ordered this encounter  Medications  . AMBULATORY NON FORMULARY MEDICATION    Sig: Medication Name: Hospital bed.  Unable to  walk/non-ambulatroy. Please fax to [facility] agency    Dispense:  1 vial    Refill:  0  . sertraline (ZOLOFT) 50 MG tablet    Sig: 1/2 tab po QD x 10 days the increase to whole tab daily    Dispense:  90 tablet    Refill:  0    Follow-up: Return in about 3 months (around 08/24/2019) for Diabetes follow-up.    Beatrice Lecher, MD

## 2019-05-25 NOTE — Assessment & Plan Note (Addendum)
Currently on Aricept.Evaluate at next f/u for effectiveness. Plan to d.c if not helpful

## 2019-05-25 NOTE — Patient Instructions (Signed)
OK to decrease Lantus to 12 units.

## 2019-05-25 NOTE — Assessment & Plan Note (Signed)
Ahead and try to get a hospital bed ordered for her.

## 2019-05-26 ENCOUNTER — Encounter: Payer: Self-pay | Admitting: Family Medicine

## 2019-05-26 NOTE — Assessment & Plan Note (Signed)
Her daughter would like for [place] to retry sertaline which she used to be on for depression. 3 month trial. If not helping will discontinue.

## 2019-06-10 ENCOUNTER — Other Ambulatory Visit: Payer: Self-pay | Admitting: Family Medicine

## 2019-06-10 ENCOUNTER — Other Ambulatory Visit: Payer: Self-pay | Admitting: Sports Medicine

## 2019-06-11 ENCOUNTER — Telehealth: Payer: Self-pay | Admitting: Family Medicine

## 2019-06-11 ENCOUNTER — Telehealth: Payer: Self-pay

## 2019-06-11 NOTE — Telephone Encounter (Signed)
Daughter called in wanting a consultation with Dr.Metheney to discuss a plan of care for patient for home health. I asked daughter if patient was coming to the appointment and she stated no it was just going to be the daughter. Offered to send a phone note to see how this needed to be scheduled with PCP. Daughter then proceeded to change mind and say, "I do want to bring my mother in, she actually needs to be seen sooner". Wanted to be seen for a rash, stool softener and the plan of care. Booked patient in a 40 minute time slot and daughter confirmed time and date. Stated "I will try to get her in there". No further questions at this time.

## 2019-06-11 NOTE — Telephone Encounter (Signed)
Baleigh's daughter called requesting home health services. She states she can't lift her mom to bath her. She also would like an order for a hospital bed.   She also wants to know what she can give her for constipation.

## 2019-06-14 MED ORDER — DOCUSATE SODIUM 100 MG PO CAPS: 100.0000 mg | ORAL_CAPSULE | Freq: Two times a day (BID) | ORAL | 5 refills | Status: DC

## 2019-06-14 NOTE — Telephone Encounter (Signed)
We just completed some paperwork for personal care services so does she still want to do home health as well.  If so we can go ahead and place an order.  Hospital bed order was placed September 8.  Okay to reprint and fax if needed.    Okay to use Colace twice a day to help soften the stools for constipation.  Prescription sent.

## 2019-06-15 ENCOUNTER — Other Ambulatory Visit: Payer: Self-pay | Admitting: *Deleted

## 2019-06-15 ENCOUNTER — Ambulatory Visit: Payer: Medicare HMO | Admitting: Family Medicine

## 2019-06-15 MED ORDER — AMBULATORY NON FORMULARY MEDICATION: 0 refills | Status: AC

## 2019-06-15 NOTE — Telephone Encounter (Signed)
Patient's daughter advised.  

## 2019-06-29 ENCOUNTER — Ambulatory Visit: Payer: Medicare HMO

## 2019-07-11 NOTE — Progress Notes (Signed)
Subjective:   BROGHAN MCFALLS is a 85 y.o. female who presents for Medicare Annual (Subsequent) preventive examination.  Review of Systems:  No ROS.  Medicare Wellness Virtual Visit.  Visual/audio telehealth visit, UTA vital signs.   See social history for additional risk factors.      Sleep patterns: getting at least 8 hours of sleep a night- has sundowners   Home Safety/Smoke Alarms: Feels safe in home. Smoke alarms in place.  Living environment; Lives with daughter who takes care of her Seat Belt Safety/Bike Helmet: Wears seat belt.   Female:   Pap-Aged out      Mammo- Aged out      Dexa scan-  Aged out      CCS- Aged out     Objective:     Vitals: There were no vitals taken for this visit.  There is no height or weight on file to calculate BMI.  Advanced Directives 06/23/2018 07/24/2017 07/16/2017 12/27/2013 09/01/2012 09/01/2012 08/25/2012  Does Patient Have a Medical Advance Directive? No Yes Yes Patient does not have advance directive;Patient would like information Patient has advance directive, copy in chart Patient has advance directive, copy in chart Patient has advance directive, copy not in chart  Type of Advance Directive - Living will Healthcare Power of [place] of [place] of [place];Living will  Does patient want to make changes to medical advance directive? - - No - Patient declined - - - -  Copy of [place] in Chart? - - - - Copy requested from family - Copy requested from family  Would patient like information on creating a medical advance directive? Yes (MAU/Ambulatory/Procedural Areas - Information given) - - Advance directive packet given - - -  Pre-existing out of facility DNR order (yellow form or pink MOST form) - - - - No - No    Tobacco Social History   Tobacco Use  Smoking Status Former Smoker  . Types: Cigarettes  . Quit date: 09/17/1987  . Years since  quitting: 31.8  Smokeless Tobacco Never Used     Counseling given: Not Answered   Clinical Intake:                       Past Medical History:  Diagnosis Date  . Anemia   . Cancer ([facility])    SCC  . Carotid artery occlusion   . COPD (chronic obstructive pulmonary disease) (HCC)    wears oxygen at 3L prn daytime and 3L at night   . DDD (degenerative disc disease)   . Diabetes mellitus (HCC)    fasting blood sugar 80-100  . Diastolic dysfunction   . Dizziness   . Glaucoma   . Headache(784.0)    migranes  . Hearing loss of both ears    wears bilateral hearing aids  . Hemorrhoids   . Holter monitor, abnormal    wearing for 21 days  . Hyperlipidemia   . Hypothyroid   . Inflammatory polyps of colon with rectal bleeding ([place] [facility])   . Irregular heart beat   . Myocardial infarction ([place]) 2006  . Pneumonia    hx  . Retinopathy of left eye   . Shortness of breath    most of time  . Syncope    Past Surgical History:  Procedure Laterality Date  . ABDOMINAL HYSTERECTOMY    . CAROTID ENDARTERECTOMY    . COLONOSCOPY W/ POLYPECTOMY    .  ENDARTERECTOMY  09/01/2012   Procedure: ENDARTERECTOMY CAROTID;  Surgeon: Conrad Fredericksburg, MD;  Location: [facility];  Service: Vascular;  Laterality: Right;  . EYE SURGERY     cataracts  . HEMORRHOID SURGERY    . PATCH ANGIOPLASTY  09/01/2012   Procedure: PATCH ANGIOPLASTY;  Surgeon: Conrad Cromwell, MD;  Location: [facility];  Service: Vascular;  Laterality: Right;  Vascu-Guard Patch Angioplasty  . squamous cell removed    . THROAT SURGERY    . TONSILLECTOMY     Family History  Problem Relation Age of Onset  . Depression Mother   . Diabetes Mother   . CAD Mother   . Heart disease Mother   . Hyperlipidemia Daughter   . CAD Father   . Heart disease Father   . Diabetes Brother   . Hypertension Son   . Breast cancer Daughter    Social History   Socioeconomic History  . Marital status: Widowed    Spouse name: Not on file  . Number of  children: 4  . Years of education: 47  . Highest education level: 12th grade  Occupational History    Employer: RETIRED    Comment: nursing  Social Needs  . Financial resource strain: Not very hard  . Food insecurity    Worry: Never true    Inability: Never true  . Transportation needs    Medical: No    Non-medical: No  Tobacco Use  . Smoking status: Former Smoker    Types: Cigarettes    Quit date: 09/17/1987    Years since quitting: 31.8  . Smokeless tobacco: Never Used  Substance and Sexual Activity  . Alcohol use: No  . Drug use: No  . Sexual activity: Not Currently  Lifestyle  . Physical activity    Days per week: 0 days    Minutes per session: 0 min  . Stress: Only a little  Relationships  . Social Herbalist on phone: Twice a week    Gets together: Never    Attends religious service: Never    Active member of club or organization: No    Attends meetings of clubs or organizations: Never    Relationship status: Widowed  Other Topics Concern  . Not on file  Social History Narrative   Lives with daughter, doesn't go outside much because of weeds and snakes. Does do puzzles a lot which stimulates the brain.    Outpatient Encounter Medications as of 07/12/2019  Medication Sig  . acetaminophen (TYLENOL ARTHRITIS PAIN) 650 MG CR tablet Take 650 mg by mouth every 8 (eight) hours as needed. For pain  . albuterol (ACCUNEB) 1.25 MG/3ML nebulizer solution Take 3 mLs (1.25 mg total) by nebulization every 6 (six) hours as needed for wheezing. Please fax to [facility].  [name] albuterol (PROVENTIL HFA;VENTOLIN HFA) 108 (90 Base) MCG/ACT inhaler INHALE 2 PUFFS INTO THE LUNGS EVERY 4 HOURS AS NEEDED FOR WHEEZING  . alendronate (FOSAMAX) 70 MG tablet TAKE ONE TABLET ONCE A WEEK  . ALLERGY RELIEF 10 MG tablet TAKE 1 TABLET BY MOUTH ONCE DAILY  . amantadine (SYMMETREL) 50 MG/5ML solution TAKE 5ML BY MOUTH DAILY  . AMBULATORY NON FORMULARY MEDICATION Medication Name: glucometer  strips to test BID.  Dx. 250.00  . AMBULATORY NON FORMULARY MEDICATION Medication Name: Hospital bed.  Unable to walk/non-ambulatroy. Please fax to [facility] agency  . AMBULATORY NON FORMULARY MEDICATION Medication Name: Portable oxygen - 2 liters. Dx. COPD.  Send to [facility].  [name]  amLODipine (NORVASC) 2.5 MG tablet Take 2.5 mg by mouth daily.  [name] aspirin 81 MG tablet Take 81 mg by mouth daily.    . Calcium-Vitamin D (CALTRATE 600 PLUS-VIT D PO) Take 1 tablet by mouth daily.   . cholecalciferol (VITAMIN D) 1000 units tablet Take 1,000 Units by mouth daily.  . cyclobenzaprine (FLEXERIL) 5 MG tablet Take 5 mg by mouth 3 (three) times daily as needed for muscle spasms.  [name] docusate sodium (COLACE) 100 MG capsule Take 1 capsule (100 mg total) by mouth 2 (two) times daily.  [name] donepezil (ARICEPT) 5 MG tablet Take 1 tablet by mouth at bedtime.  . Fluticasone-Umeclidin-Vilant (TRELEGY ELLIPTA) 100-62.5-25 MCG/INH AEPB Inhale 1 puff into the lungs daily.  . insulin aspart (NOVOLOG) 100 UNIT/ML injection Inject into the skin.  . Insulin Glargine (LANTUS SOLOSTAR) 100 UNIT/ML Solostar Pen Inject 12 Units into the skin at bedtime.  . Lidocaine HCl 3 % LOTN Apply topically as needed.  . Multiple Vitamin (MULTIVITAMIN) tablet Take 1 tablet by mouth daily.    . naproxen (NAPROSYN) 500 MG tablet TAKE ONE TABLET BY MOUTH 2 TIMES A DAY WITH A MEAL AS NEEDED FOR JAW AND EAR PAIN  . nitroGLYCERIN (NITROSTAT) 0.4 MG SL tablet Place 1 tablet (0.4 mg total) under the tongue every 5 (five) minutes as needed. For chest pain  . QC LANCETS SUPER THIN MISC TEST TWICE DAILY  . sertraline (ZOLOFT) 50 MG tablet 1/2 tab po QD x 10 days the increase to whole tab daily  . simvastatin (ZOCOR) 20 MG tablet TAKE ONE TABLET BY MOUTH DAILY AT 6PM  . SYNTHROID 75 MCG tablet Take 1 tablet (75 mcg total) by mouth daily before breakfast. 30 day supply given MUST SCHEDULE AND KEEP APPOINTMENT FOR REFILLS   No facility-administered encounter  medications on file as of 07/12/2019.     Activities of Daily Living No flowsheet data found.  Patient Care Team: Hali Marry, MD as PCP - General (Family Medicine) Stanford Breed Denice Bors, MD as Attending Physician (Cardiology)    Assessment:   This is a routine wellness examination for [place].Physical assessment deferred to PCP.   Exercise Activities and Dietary recommendations   Diet  Breakfast:boost, banana and cereal or egg and toast Lunch: sandwich and chips Dinner: meat and vegetables or salads      Goals    . DIET - INCREASE WATER INTAKE     Increase water intake to 64 ounces a day. This will help your bowels as well.       Fall Risk Fall Risk  02/18/2019 11/25/2018 07/08/2018 06/23/2018 05/14/2018  Falls in the past year? 1 0 Yes Yes Yes  Number falls in past yr: 0 - 1 2 or more 1  Injury with Fall? 1 - Yes Yes Yes  Comment she broke her elbow - - - -  Risk Factor Category  - - - High Fall Risk -  Risk for fall due to : History of fall(s);Impaired balance/gait - - History of fall(s);Impaired balance/gait -  Follow up Falls evaluation completed;Education provided - Falls evaluation completed Falls prevention discussed Education provided   Is the patient's home free of loose throw rugs in walkways, pet beds, electrical cords, etc?   yes      Grab bars in the bathroom? yes      Handrails on the stairs?   yes      Adequate lighting?   no  Depression Screen PHQ 2/9 Scores 06/23/2018 06/09/2018 07/24/2017  07/22/2017  PHQ - 2 Score 1 0 2 4  PHQ- 9 Score - - 8 12  Exception Documentation - - - -  Not completed - - - -     Cognitive Function     6CIT Screen 06/23/2018  What Year? 0 points  What month? 0 points  What time? 0 points  Count back from 20 0 points  Months in reverse 0 points  Repeat phrase 6 points  Total Score 6    Immunization History  Administered Date(s) Administered  . Fluad Quad(high Dose 65+) 05/25/2019  . H1N1 10/07/2008  . Influenza  Split 07/09/2012  . Influenza Whole 06/04/2010  . Influenza, High Dose Seasonal PF 07/22/2016, 07/14/2017, 06/09/2018  . Influenza,inj,Quad PF,6+ Mos 05/25/2013, 07/12/2014, 06/16/2015  . Pneumococcal Conjugate-13 10/13/2014, 07/14/2017  . Pneumococcal Polysaccharide-23 05/30/2009  . Td 03/28/2006  . Tdap 12/05/2015  . Zoster 04/22/2011    Screening Tests Health Maintenance  Topic Date Due  . URINE MICROALBUMIN  12/10/2017  . OPHTHALMOLOGY EXAM  03/06/2019  . FOOT EXAM  03/11/2019  . HEMOGLOBIN A1C  11/22/2019  . TETANUS/TDAP  12/04/2025  . INFLUENZA VACCINE  Completed  . DEXA SCAN  Completed  . PNA vac Low Risk Adult  Completed        Plan:    Please schedule your next medicare wellness visit with me in 1 yr.  Ms. Cos , Thank you for taking time to come for your Medicare Wellness Visit. I appreciate your ongoing commitment to your health goals. Please review the following plan we discussed and let me know if I can assist you in the future.   These are the goals we discussed: Goals    . DIET - INCREASE WATER INTAKE     Increase water intake to 64 ounces a day. This will help your bowels as well.       This is a list of the screening recommended for you and due dates:  Health Maintenance  Topic Date Due  . Urine Protein Check  12/10/2017  . Eye exam for diabetics  03/06/2019  . Complete foot exam   03/11/2019  . Hemoglobin A1C  11/22/2019  . Tetanus Vaccine  12/04/2025  . Flu Shot  Completed  . DEXA scan (bone density measurement)  Completed  . Pneumonia vaccines  Completed       I have personally reviewed and noted the following in the patient's chart:   . Medical and social history . Use of alcohol, tobacco or illicit drugs  . Current medications and supplements . Functional ability and status . Nutritional status . Physical activity . Advanced directives . List of other physicians . Hospitalizations, surgeries, and ER visits in previous 12  months . Vitals . Screenings to include cognitive, depression, and falls . Referrals and appointments  In addition, I have reviewed and discussed with patient certain preventive protocols, quality metrics, and best practice recommendations. A written personalized care plan for preventive services as well as general preventive health recommendations were provided to patient.     Joanne Chars, LPN  [id number]

## 2019-07-12 ENCOUNTER — Telehealth: Payer: Self-pay | Admitting: Family Medicine

## 2019-07-12 ENCOUNTER — Ambulatory Visit (INDEPENDENT_AMBULATORY_CARE_PROVIDER_SITE_OTHER): Payer: Medicare HMO | Admitting: *Deleted

## 2019-07-12 VITALS — Ht 64.0 in | Wt 120.0 lb

## 2019-07-12 DIAGNOSIS — Z0000 Encounter for general adult medical examination without abnormal findings: Secondary | ICD-10-CM

## 2019-07-12 DIAGNOSIS — J438 Other emphysema: Secondary | ICD-10-CM

## 2019-07-12 DIAGNOSIS — Z993 Dependence on wheelchair: Secondary | ICD-10-CM

## 2019-07-12 NOTE — Patient Instructions (Signed)
Please schedule your next medicare wellness visit with me in 1 yr.  Theresa Huang , Thank you for taking time to come for your Medicare Wellness Visit. I appreciate your ongoing commitment to your health goals. Please review the following plan we discussed and let me know if I can assist you in the future.

## 2019-07-12 NOTE — Telephone Encounter (Signed)
Called daughter for her wellness exam and daughter is requesting a prescription for a wheelchair, the one she has been using is her daughters husband and he had another stroke and needs his back. Please write rx for wheel chair and also said she has not heard from anyone on the hospital bed. Please advise and let patient know

## 2019-07-14 MED ORDER — AMBULATORY NON FORMULARY MEDICATION: 0 refills | Status: DC

## 2019-07-14 NOTE — Telephone Encounter (Signed)
Okay for prescription for wheelchair.

## 2019-07-14 NOTE — Telephone Encounter (Signed)
Order printed. Called and lvm asking pt's daughter to rtn call about where she would like for this to be sent. Maryruth Eve, [place] Cro[place], CMA

## 2019-07-16 ENCOUNTER — Other Ambulatory Visit: Payer: Self-pay | Admitting: Family Medicine

## 2019-07-16 MED ORDER — AMBULATORY NON FORMULARY MEDICATION: 0 refills | Status: DC

## 2019-07-16 NOTE — Telephone Encounter (Signed)
Spoke w/pt's daughter and asked if she had a preference she stated that she didn't know who to send this to. I told her that I would fax this to [facility].Theresa Huang, [place]

## 2019-08-11 ENCOUNTER — Other Ambulatory Visit: Payer: Self-pay

## 2019-08-11 ENCOUNTER — Encounter: Payer: Self-pay | Admitting: Family Medicine

## 2019-08-11 ENCOUNTER — Ambulatory Visit (INDEPENDENT_AMBULATORY_CARE_PROVIDER_SITE_OTHER): Payer: Medicare HMO | Admitting: Family Medicine

## 2019-08-11 VITALS — BP 164/85 | HR 110 | Ht 64.0 in | Wt 127.0 lb

## 2019-08-11 DIAGNOSIS — J438 Other emphysema: Secondary | ICD-10-CM

## 2019-08-11 DIAGNOSIS — G301 Alzheimer's disease with late onset: Secondary | ICD-10-CM

## 2019-08-11 DIAGNOSIS — Z794 Long term (current) use of insulin: Secondary | ICD-10-CM

## 2019-08-11 DIAGNOSIS — E038 Other specified hypothyroidism: Secondary | ICD-10-CM

## 2019-08-11 DIAGNOSIS — Z993 Dependence on wheelchair: Secondary | ICD-10-CM

## 2019-08-11 DIAGNOSIS — E118 Type 2 diabetes mellitus with unspecified complications: Secondary | ICD-10-CM | POA: Diagnosis not present

## 2019-08-11 DIAGNOSIS — N1831 Chronic kidney disease, stage 3a: Secondary | ICD-10-CM

## 2019-08-11 DIAGNOSIS — L89152 Pressure ulcer of sacral region, stage 2: Secondary | ICD-10-CM

## 2019-08-11 DIAGNOSIS — F0280 Dementia in other diseases classified elsewhere without behavioral disturbance: Secondary | ICD-10-CM

## 2019-08-11 LAB — POCT CBG (FASTING - GLUCOSE)-MANUAL ENTRY: Glucose Fasting, POC: 108 mg/dL — AB (ref 70–99)

## 2019-08-11 MED ORDER — RESTORE BARRIER CREA: 1.0000 "application " | TOPICAL_CREAM | Freq: Two times a day (BID) | 1 refills | Status: AC

## 2019-08-11 MED ORDER — AMBULATORY NON FORMULARY MEDICATION: 0 refills | Status: DC

## 2019-08-11 MED ORDER — AMBULATORY NON FORMULARY MEDICATION: 0 refills | Status: AC

## 2019-08-11 MED ORDER — ALLKARE PROTECT BARRIER WIPES MISC: 1.0000 "application " | Freq: Three times a day (TID) | 1 refills | Status: AC | PRN

## 2019-08-11 NOTE — Assessment & Plan Note (Signed)
We will go ahead and get A1c today since she is here and has extra help with her second daughter being in town.

## 2019-08-11 NOTE — Assessment & Plan Note (Addendum)
Tegaderm placed over wounds today.  We will also send over prescription barrier ointment.  We will get home health to send out a wound care nurse.  If they do not hear from somebody by early next week then please let us know.  We also discussed the importance of changing positions every 30 minutes to get pressure off of that area.  Again we will work on getting the gel cushion for the wheelchair.

## 2019-08-11 NOTE — Assessment & Plan Note (Signed)
Due to recheck renal function. 

## 2019-08-11 NOTE — Assessment & Plan Note (Signed)
Due to recheck TSH. 

## 2019-08-11 NOTE — Progress Notes (Signed)
Established Patient Office Visit  Subjective:  Patient ID: Theresa Huang, female    DOB: Oct 12, 1930  Age: 85 y.o. MRN: [id number]  CC:  Chief Complaint  Patient presents with  . Follow-up    HPI Theresa Huang presents for valuation of sores on her bottom.  She is here today with her daughter who is her primary caretaker and her other daughter who is an Therapist, sports who lives in [place] and happens to be here for the holidays.  She says really in the last 2 days she has noticed 2 lesions on the buttock area.  The one on the left started first and now the right.  She says initially the skin peeled and then it became red and irritated.  She has not noticed any bad odor or drainage.  No fevers chills or sweats.  She says that they never received the gel cushion for the wheelchair.  It was on the original prescription that was written for the wheelchair.  She says all that they were given was just a regular cushion seat.  Is actually Due to come back in a couple weeks for her regular follow-up for diabetes, renal disease and dementia.   Past Medical History:  Diagnosis Date  . Anemia   . Cancer ([place])    SCC  . Carotid artery occlusion   . COPD (chronic obstructive pulmonary disease) (HCC)    wears oxygen at 3L prn daytime and 3L at night   . DDD (degenerative disc disease)   . Diabetes mellitus (HCC)    fasting blood sugar 80-100  . Diastolic dysfunction   . Dizziness   . Glaucoma   . Headache(784.0)    migranes  . Hearing loss of both ears    wears bilateral hearing aids  . Hemorrhoids   . Holter monitor, abnormal    wearing for 21 days  . Hyperlipidemia   . Hypothyroid   . Inflammatory polyps of colon with rectal bleeding ([place])   . Irregular heart beat   . Myocardial infarction ([place]) 2006  . Pneumonia    hx  . Retinopathy of left eye   . Shortness of breath    most of time  . Syncope     Past Surgical History:  Procedure Laterality Date  . ABDOMINAL HYSTERECTOMY     . CAROTID ENDARTERECTOMY    . COLONOSCOPY W/ POLYPECTOMY    . ENDARTERECTOMY  09/01/2012   Procedure: ENDARTERECTOMY CAROTID;  Surgeon: Conrad Greenview, MD;  Location: [facility];  Service: Vascular;  Laterality: Right;  . EYE SURGERY     cataracts  . HEMORRHOID SURGERY    . PATCH ANGIOPLASTY  09/01/2012   Procedure: PATCH ANGIOPLASTY;  Surgeon: Conrad Sophia, MD;  Location: [facility];  Service: Vascular;  Laterality: Right;  Vascu-Guard Patch Angioplasty  . squamous cell removed    . THROAT SURGERY    . TONSILLECTOMY      Family History  Problem Relation Age of Onset  . Depression Mother   . Diabetes Mother   . CAD Mother   . Heart disease Mother   . Hyperlipidemia Daughter   . CAD Father   . Heart disease Father   . Diabetes Brother   . Hypertension Son   . Breast cancer Daughter     Social History   Socioeconomic History  . Marital status: Widowed    Spouse name: Not on file  . Number of children: 4  . Years of  education: 84  . Highest education level: 12th grade  Occupational History    Employer: RETIRED    Comment: nursing  Social Needs  . Financial resource strain: Not very hard  . Food insecurity    Worry: Never true    Inability: Never true  . Transportation needs    Medical: No    Non-medical: No  Tobacco Use  . Smoking status: Former Smoker    Types: Cigarettes    Quit date: 09/17/1987    Years since quitting: 31.9  . Smokeless tobacco: Never Used  Substance and Sexual Activity  . Alcohol use: No  . Drug use: No  . Sexual activity: Not Currently  Lifestyle  . Physical activity    Days per week: 0 days    Minutes per session: 0 min  . Stress: Not at all  Relationships  . Social Herbalist on phone: Once a week    Gets together: Never    Attends religious service: Never    Active member of club or organization: No    Attends meetings of clubs or organizations: Never    Relationship status: Widowed  . Intimate partner violence    Fear of  current or ex partner: No    Emotionally abused: No    Physically abused: No    Forced sexual activity: No  Other Topics Concern  . Not on file  Social History Narrative   Lives with daughter, doesn't go outside much because of weeds and snakes.Was diagnosed with dementia stays confused a lot per her daughter who done visit for her    Outpatient Medications Prior to Visit  Medication Sig Dispense Refill  . acetaminophen (TYLENOL ARTHRITIS PAIN) 650 MG CR tablet Take 650 mg by mouth every 8 (eight) hours as needed. For pain    . albuterol (ACCUNEB) 1.25 MG/3ML nebulizer solution Take 3 mLs (1.25 mg total) by nebulization every 6 (six) hours as needed for wheezing. Please fax to [facility]. 75 mL 12  . albuterol (PROVENTIL HFA;VENTOLIN HFA) 108 (90 Base) MCG/ACT inhaler INHALE 2 PUFFS INTO THE LUNGS EVERY 4 HOURS AS NEEDED FOR WHEEZING 18 g 3  . alendronate (FOSAMAX) 70 MG tablet TAKE ONE TABLET ONCE A WEEK 4 tablet 12  . ALLERGY RELIEF 10 MG tablet TAKE 1 TABLET BY MOUTH ONCE DAILY 30 tablet 11  . amantadine (SYMMETREL) 50 MG/5ML solution TAKE 5ML BY MOUTH DAILY    . AMBULATORY NON FORMULARY MEDICATION Medication Name: glucometer strips to test BID.  Dx. 250.00 100 Units PRN  . AMBULATORY NON FORMULARY MEDICATION Medication Name: Portable oxygen - 2 liters. Dx. COPD.  Send to [facility]. 1 vial 0  . AMBULATORY NON FORMULARY MEDICATION Medication Name:  lightweight wheelchair with gel seat cushion, anti-tippers, brake lock extensions and elevated leg rests. DX: J44.1, Z99.3 1 each 0  . AMBULATORY NON FORMULARY MEDICATION Medication Name: Hospital bed.  Unable to walk/non-ambulatroy. Please fax to [facility] agency 1 each 0  . amLODipine (NORVASC) 2.5 MG tablet Take 2.5 mg by mouth daily.    [name] aspirin 81 MG tablet Take 81 mg by mouth daily.      . Calcium-Vitamin D (CALTRATE 600 PLUS-VIT D PO) Take 1 tablet by mouth daily.     . cholecalciferol (VITAMIN D) 1000 units tablet Take 1,000 Units by mouth daily.    .  cyclobenzaprine (FLEXERIL) 5 MG tablet Take 5 mg by mouth 3 (three) times daily as needed for muscle spasms.    [name]  docusate sodium (COLACE) 100 MG capsule Take 1 capsule (100 mg total) by mouth 2 (two) times daily. 60 capsule 5  . donepezil (ARICEPT) 5 MG tablet Take 1 tablet by mouth at bedtime.    . Fluticasone-Umeclidin-Vilant (TRELEGY ELLIPTA) 100-62.5-25 MCG/INH AEPB Inhale 1 puff into the lungs daily. 60 each 11  . insulin aspart (NOVOLOG) 100 UNIT/ML injection Inject into the skin.    . Insulin Glargine (LANTUS SOLOSTAR) 100 UNIT/ML Solostar Pen Inject 12 Units into the skin at bedtime. 5 pen 6  . Lidocaine HCl 3 % LOTN Apply topically as needed.    . Multiple Vitamin (MULTIVITAMIN) tablet Take 1 tablet by mouth daily.      . naproxen (NAPROSYN) 500 MG tablet TAKE ONE TABLET BY MOUTH 2 TIMES A DAY WITH A MEAL AS NEEDED FOR JAW AND EAR PAIN 60 tablet 3  . nitroGLYCERIN (NITROSTAT) 0.4 MG SL tablet Place 1 tablet (0.4 mg total) under the tongue every 5 (five) minutes as needed. For chest pain 30 tablet 0  . QC LANCETS SUPER THIN MISC TEST TWICE DAILY 100 each 2  . sertraline (ZOLOFT) 50 MG tablet 1/2 tab po QD x 10 days the increase to whole tab daily 90 tablet 0  . simvastatin (ZOCOR) 20 MG tablet TAKE ONE TABLET BY MOUTH DAILY AT 6PM 30 tablet 11  . SYNTHROID 75 MCG tablet Take 1 tablet (75 mcg total) by mouth daily before breakfast. 30 tablet 0   No facility-administered medications prior to visit.     Allergies  Allergen Reactions  . Bee Venom Anaphylaxis  . Other Anaphylaxis  . Tetracyclines & Related Anaphylaxis  . Tetracycline     REACTION: arms swelling, itching, blisters  . Tramadol Other (See Comments)    Excess sedation     ROS Review of Systems    Objective:    Physical Exam  Constitutional: She is oriented to person, place, and time. She appears well-developed and well-nourished.  HENT:  Head: Normocephalic and atraumatic.  Eyes: Conjunctivae and EOM are  normal.  Pulmonary/Chest: Effort normal.  Neurological: She is alert and oriented to person, place, and time.  Skin: Skin is dry. No pallor.  On the sacrum she has 2 pressure ulcers, one on each buttock area.  The one on the left buttock cheek measures approximately 1 x 1-1/2 cm.  There is also some dusky appearance around the edges of the wound.  The superficial layer of skin is missing.  On the right buttock cheek the area measures approximately 2.8 x 3-1/2 cm, again top layer of skin removed has a more moist appearance but no discolored exudate.  There is just a small amount of erythema around the border.  Psychiatric: She has a normal mood and affect. Her behavior is normal.  Vitals reviewed.      BP (!) 164/85   Pulse (!) 110   Ht 5\' 4"  (1.626 m)   Wt 127 lb (57.6 kg)   BMI 21.80 kg/m  Wt Readings from Last 3 Encounters:  08/11/19 127 lb (57.6 kg)  07/12/19 120 lb (54.4 kg)  05/25/19 127 lb (57.6 kg)     Health Maintenance Due  Topic Date Due  . URINE MICROALBUMIN  12/10/2017  . OPHTHALMOLOGY EXAM  03/06/2019  . FOOT EXAM  03/11/2019    There are no preventive care reminders to display for this patient.  Lab Results  Component Value Date   TSH 4.79 (H) 01/20/2017   Lab Results  Component  Value Date   WBC 6.5 12/27/2014   HGB 12.0 12/27/2014   HCT 40.6 07/12/2014   MCV 92.5 07/12/2014   PLT 219 12/27/2014   Lab Results  Component Value Date   NA 139 06/10/2018   K 4.9 06/10/2018   CO2 32 06/10/2018   GLUCOSE 142 (H) 06/10/2018   BUN 33 (H) 06/10/2018   CREATININE 1.57 (H) 06/10/2018   BILITOT 0.5 06/10/2018   ALKPHOS 60 07/22/2016   AST 15 06/10/2018   ALT 10 06/10/2018   PROT 6.7 06/10/2018   ALBUMIN 3.8 07/22/2016   CALCIUM 10.0 06/10/2018   Lab Results  Component Value Date   CHOL 169 06/10/2018   Lab Results  Component Value Date   HDL 61 06/10/2018   Lab Results  Component Value Date   LDLCALC 89 06/10/2018   Lab Results  Component  Value Date   TRIG 91 06/10/2018   Lab Results  Component Value Date   CHOLHDL 2.8 06/10/2018   Lab Results  Component Value Date   HGBA1C 5.8 (A) 05/25/2019      Assessment & Plan:   Problem List Items Addressed This Visit      Endocrine   Type 2 diabetes mellitus with complication, with long-term current use of insulin ([place]) - Primary    We will go ahead and get A1c today since she is here and has extra help with her second daughter being in town.      Relevant Orders   COMPLETE METABOLIC PANEL WITH GFR   CBC w/Diff   TSH   Hemoglobin A1c   POCT CBG (Fasting - Glucose) (Completed)   Hypothyroidism    Due to recheck TSH        Nervous and Auditory   Dementia (HCC)   Relevant Orders   COMPLETE METABOLIC PANEL WITH GFR   CBC w/Diff   TSH   Hemoglobin A1c     Musculoskeletal and Integument   Pressure injury of sacral region, stage 2 (HCC)    Tegaderm placed over wounds today.  We will also send over prescription barrier ointment.  We will get home health to send out a wound care nurse.  If they do not hear from somebody by early next week then please let us know.  We also discussed the importance of changing positions every 30 minutes to get pressure off of that area.  Again we will work on getting the gel cushion for the wheelchair.      Relevant Orders   Ambulatory referral to [facility] (MODERATE)    Due to recheck renal function       Relevant Orders   COMPLETE METABOLIC PANEL WITH GFR   CBC w/Diff   TSH   Hemoglobin A1c     Other   Wheelchair dependence    Will try to call and find out why she was not provided a gel seat it was written on the original prescription.         Meds ordered this encounter  Medications  . Ostomy Supplies (ALLKARE PROTECT BARRIER WIPES) MISC    Sig: 1 application by Does not apply route 3 (three) times daily as needed.    Dispense:  100 each    Refill:  1  .  protective barrier (RESTORE) CREA    Sig: Apply 1 application topically 2 (two) times daily.    Dispense:  120 g  Refill:  1    Follow-up: Return for keep appt in December.  Beatrice Lecher, MD

## 2019-08-11 NOTE — Assessment & Plan Note (Signed)
Will try to call and find out why she was not provided a gel seat it was written on the original prescription.

## 2019-08-11 NOTE — Addendum Note (Signed)
Addended by: Dessie Coma on: 08/11/2019 03:58 PM   Modules accepted: Orders

## 2019-08-12 LAB — COMPLETE METABOLIC PANEL WITH GFR
AG Ratio: 1.2 (calc) (ref 1.0–2.5)
ALT: 14 U/L (ref 6–29)
AST: 19 U/L (ref 10–35)
Albumin: 3.7 g/dL (ref 3.6–5.1)
Alkaline phosphatase (APISO): 82 U/L (ref 37–153)
BUN/Creatinine Ratio: 25 (calc) — ABNORMAL HIGH (ref 6–22)
BUN: 36 mg/dL — ABNORMAL HIGH (ref 7–25)
CO2: 30 mmol/L (ref 20–32)
Calcium: 10.2 mg/dL (ref 8.6–10.4)
Chloride: 103 mmol/L (ref 98–110)
Creat: 1.44 mg/dL — ABNORMAL HIGH (ref 0.60–0.88)
GFR, Est African American: 38 mL/min/{1.73_m2} — ABNORMAL LOW (ref >=60)
GFR, Est Non African American: 33 mL/min/{1.73_m2} — ABNORMAL LOW (ref >=60)
Globulin: 3 g/dL (calc) (ref 1.9–3.7)
Glucose, Bld: 80 mg/dL (ref 65–99)
Potassium: 5.2 mmol/L (ref 3.5–5.3)
Sodium: 142 mmol/L (ref 135–146)
Total Bilirubin: 0.2 mg/dL (ref 0.2–1.2)
Total Protein: 6.7 g/dL (ref 6.1–8.1)

## 2019-08-12 LAB — CBC WITH DIFFERENTIAL/PLATELET
Absolute Monocytes: 518 cells/uL (ref 200–950)
Basophils Absolute: 57 cells/uL (ref 0–200)
Basophils Relative: 0.8 %
Eosinophils Absolute: 305 cells/uL (ref 15–500)
Eosinophils Relative: 4.3 %
HCT: 35.3 % (ref 35.0–45.0)
Hemoglobin: 11.3 g/dL — ABNORMAL LOW (ref 11.7–15.5)
Lymphs Abs: 1612 cells/uL (ref 850–3900)
MCH: 29.9 pg (ref 27.0–33.0)
MCHC: 32 g/dL (ref 32.0–36.0)
MCV: 93.4 fL (ref 80.0–100.0)
MPV: 11.8 fL (ref 7.5–12.5)
Monocytes Relative: 7.3 %
Neutro Abs: 4608 cells/uL (ref 1500–7800)
Neutrophils Relative %: 64.9 %
Platelets: 183 10*3/uL (ref 140–400)
RBC: 3.78 10*6/uL — ABNORMAL LOW (ref 3.80–5.10)
RDW: 13.1 % (ref 11.0–15.0)
Total Lymphocyte: 22.7 %
WBC: 7.1 10*3/uL (ref 3.8–10.8)

## 2019-08-12 LAB — TSH: TSH: 4.11 mIU/L (ref 0.40–4.50)

## 2019-08-12 LAB — HEMOGLOBIN A1C
Hgb A1c MFr Bld: 5.6 % of total Hgb (ref <5.7)
Mean Plasma Glucose: 114 (calc)
eAG (mmol/L): 6.3 (calc)

## 2019-08-16 ENCOUNTER — Other Ambulatory Visit: Payer: Self-pay | Admitting: Family Medicine

## 2019-08-16 MED ORDER — SYNTHROID 88 MCG PO TABS: 88.0000 ug | ORAL_TABLET | Freq: Every day | ORAL | 3 refills | Status: DC

## 2019-08-24 ENCOUNTER — Ambulatory Visit: Payer: Medicare HMO | Admitting: Family Medicine

## 2019-08-30 ENCOUNTER — Other Ambulatory Visit: Payer: Self-pay | Admitting: Sports Medicine

## 2019-09-13 ENCOUNTER — Other Ambulatory Visit: Payer: Self-pay | Admitting: Family Medicine

## 2019-09-15 ENCOUNTER — Telehealth: Payer: Self-pay | Admitting: Family Medicine

## 2019-09-15 NOTE — Telephone Encounter (Signed)
I called Theresa Huang and spoke with [name] at [facility]. She is going to send an order out to the patient for the patches to help with the sores. Per [name] the patient is going to be discharged from home health and then family will be responsible.

## 2019-09-23 ENCOUNTER — Other Ambulatory Visit: Payer: Self-pay | Admitting: Family Medicine

## 2019-09-27 ENCOUNTER — Telehealth: Payer: Self-pay | Admitting: Family Medicine

## 2019-09-27 NOTE — Telephone Encounter (Signed)
Patient daughter called and is needing a new prescription for diapers and not pull ups. I have left a call back number for home health to contact the office. Waiting on call back for new prescription for diapers and not pull ups.

## 2019-09-29 NOTE — Telephone Encounter (Signed)
Forms were left in PCP box and Tonya faxed them to the home health.

## 2019-10-14 ENCOUNTER — Telehealth: Payer: Self-pay

## 2019-10-14 NOTE — Telephone Encounter (Signed)
Agree. Is she is pain?  She can also increase her amlodipine to 2 tabs daily until appt next week.

## 2019-10-14 NOTE — Telephone Encounter (Signed)
Theresa Huang with Home Health called and states her blood pressure is 162/94, pulse 123 BPM and O2 94 %. Denies chest pain, shortness of breath, vision problems, nausea or vomiting. The earliest she can come in is next Tuesday. She has been scheduled. Advised to take her to the ED if she worsens or starts having chest pain, shortness of breath or vision problems.

## 2019-10-15 NOTE — Telephone Encounter (Signed)
I called and spoke with her daughter. She states she thinks her pulse and blood pressure elevation was due to DayQuil she had given her earlier in the day. I asked her to check her blood pressure and pulse and she states she will as soon as she returns home.

## 2019-10-19 ENCOUNTER — Ambulatory Visit: Payer: Medicare HMO | Admitting: Sports Medicine

## 2019-10-19 ENCOUNTER — Other Ambulatory Visit: Payer: Self-pay | Admitting: Sports Medicine

## 2019-10-19 ENCOUNTER — Ambulatory Visit: Payer: Medicare HMO | Admitting: Family Medicine

## 2019-10-19 NOTE — Telephone Encounter (Signed)
Pt has OV today.Maryruth Eve, [place] Crocker, CMA

## 2019-10-19 NOTE — Progress Notes (Deleted)
Established Patient Office Visit  Subjective:  Patient ID: Theresa Huang, female    DOB: [birth year]  Age: 86 y.o. MRN: [id number]  CC: No chief complaint on file.   HPI Theresa Huang presents for Elevated BPs.  Home health was initially concerned because home blood pressures were running a little elevated.  It turns out if she had been taking some DayQuil and her daughter felt like that might be contributing.  Past Medical History:  Diagnosis Date  . Anemia   . Cancer ([place])    SCC  . Carotid artery occlusion   . COPD (chronic obstructive pulmonary disease) (HCC)    wears oxygen at 3L prn daytime and 3L at night   . DDD (degenerative disc disease)   . Diabetes mellitus (HCC)    fasting blood sugar 80-100  . Diastolic dysfunction   . Dizziness   . Glaucoma   . Headache(784.0)    migranes  . Hearing loss of both ears    wears bilateral hearing aids  . Hemorrhoids   . Holter monitor, abnormal    wearing for 21 days  . Hyperlipidemia   . Hypothyroid   . Inflammatory polyps of colon with rectal bleeding ([place])   . Irregular heart beat   . Myocardial infarction ([place]) 2006  . Pneumonia    hx  . Retinopathy of left eye   . Shortness of breath    most of time  . Syncope     Past Surgical History:  Procedure Laterality Date  . ABDOMINAL HYSTERECTOMY    . CAROTID ENDARTERECTOMY    . COLONOSCOPY W/ POLYPECTOMY    . ENDARTERECTOMY  09/01/2012   Procedure: ENDARTERECTOMY CAROTID;  Surgeon: Conrad Floris, MD;  Location: [facility];  Service: Vascular;  Laterality: Right;  . EYE SURGERY     cataracts  . HEMORRHOID SURGERY    . PATCH ANGIOPLASTY  09/01/2012   Procedure: PATCH ANGIOPLASTY;  Surgeon: Conrad Zoar, MD;  Location: [facility];  Service: Vascular;  Laterality: Right;  Vascu-Guard Patch Angioplasty  . squamous cell removed    . THROAT SURGERY    . TONSILLECTOMY      Family History  Problem Relation Age of Onset  . Depression Mother   . Diabetes Mother   . CAD  Mother   . Heart disease Mother   . Hyperlipidemia Daughter   . CAD Father   . Heart disease Father   . Diabetes Brother   . Hypertension Son   . Breast cancer Daughter     Social History   Socioeconomic History  . Marital status: Widowed    Spouse name: Not on file  . Number of children: 4  . Years of education: 78  . Highest education level: 12th grade  Occupational History    Employer: RETIRED    Comment: nursing  Tobacco Use  . Smoking status: Former Smoker    Types: Cigarettes    Quit date: 09/17/1987    Years since quitting: 32.1  . Smokeless tobacco: Never Used  Substance and Sexual Activity  . Alcohol use: No  . Drug use: No  . Sexual activity: Not Currently  Other Topics Concern  . Not on file  Social History Narrative   Lives with daughter, doesn't go outside much because of weeds and snakes.Was diagnosed with dementia stays confused a lot per her daughter who done visit for her   Social Determinants of Health   Financial Resource Strain:   .  Difficulty of Paying Living Expenses: Not on file  Food Insecurity:   . Worried About Charity fundraiser in the Last Year: Not on file  . Ran Out of Food in the Last Year: Not on file  Transportation Needs:   . Lack of Transportation (Medical): Not on file  . Lack of Transportation (Non-Medical): Not on file  Physical Activity:   . Days of Exercise per Week: Not on file  . Minutes of Exercise per Session: Not on file  Stress: No Stress Concern Present  . Feeling of Stress : Not at all  Social Connections: Unknown  . Frequency of Communication with Friends and Family: Once a week  . Frequency of Social Gatherings with Friends and Family: Not on file  . Attends Religious Services: Not on file  . Active Member of Clubs or Organizations: Not on file  . Attends Archivist Meetings: Not on file  . Marital Status: Not on file  Intimate Partner Violence:   . Fear of Current or Ex-Partner: Not on file  .  Emotionally Abused: Not on file  . Physically Abused: Not on file  . Sexually Abused: Not on file    Outpatient Medications Prior to Visit  Medication Sig Dispense Refill  . acetaminophen (TYLENOL ARTHRITIS PAIN) 650 MG CR tablet Take 650 mg by mouth every 8 (eight) hours as needed. For pain    . albuterol (ACCUNEB) 1.25 MG/3ML nebulizer solution Take 3 mLs (1.25 mg total) by nebulization every 6 (six) hours as needed for wheezing. Please fax to [place]. 75 mL 12  . albuterol (PROVENTIL HFA;VENTOLIN HFA) 108 (90 Base) MCG/ACT inhaler INHALE 2 PUFFS INTO THE LUNGS EVERY 4 HOURS AS NEEDED FOR WHEEZING 18 g 3  . alendronate (FOSAMAX) 70 MG tablet TAKE ONE TABLET ONCE A WEEK 4 tablet 12  . ALLERGY RELIEF 10 MG tablet TAKE 1 TABLET BY MOUTH ONCE DAILY 30 tablet 11  . amantadine (SYMMETREL) 50 MG/5ML solution TAKE 5ML BY MOUTH DAILY    . AMBULATORY NON FORMULARY MEDICATION Medication Name: glucometer strips to test BID.  Dx. 250.00 100 Units PRN  . AMBULATORY NON FORMULARY MEDICATION Medication Name: Portable oxygen - 2 liters. Dx. COPD.  Send to [place]. 1 vial 0  . AMBULATORY NON FORMULARY MEDICATION Medication Name: Hospital bed.  Unable to walk/non-ambulatroy. Please fax to [facility] agency 1 each 0  . AMBULATORY NON FORMULARY MEDICATION Medication Name:  lightweight wheelchair with gel seat cushion, anti-tippers, brake lock extensions and elevated leg rests. DX: J44.1, Z99.3 1 each 0  . AMBULATORY NON FORMULARY MEDICATION Bedside table for patient, Dx: J44.1, 1 each 0  . amLODipine (NORVASC) 2.5 MG tablet Take 2.5 mg by mouth daily.    [name] aspirin 81 MG tablet Take 81 mg by mouth daily.      . Calcium-Vitamin D (CALTRATE 600 PLUS-VIT D PO) Take 1 tablet by mouth daily.     . cholecalciferol (VITAMIN D) 1000 units tablet Take 1,000 Units by mouth daily.    [name] docusate sodium (COLACE) 100 MG capsule Take 1 capsule (100 mg total) by mouth 2 (two) times daily. 60 capsule 5  . donepezil (ARICEPT) 5 MG tablet Take  1 tablet by mouth at bedtime.    . Fluticasone-Umeclidin-Vilant (TRELEGY ELLIPTA) 100-62.5-25 MCG/INH AEPB Inhale 1 puff into the lungs daily. 60 each 11  . insulin aspart (NOVOLOG) 100 UNIT/ML injection Inject into the skin.    . Insulin Glargine (LANTUS SOLOSTAR) 100 UNIT/ML Solostar Pen  Inject 12 Units into the skin at bedtime. 5 pen 6  . Lidocaine HCl 3 % LOTN Apply topically as needed.    . Multiple Vitamin (MULTIVITAMIN) tablet Take 1 tablet by mouth daily.      . naproxen (NAPROSYN) 500 MG tablet TAKE ONE TABLET BY MOUTH 2 TIMES A DAY WITH A MEAL AS NEEDED FOR JAW AND EAR PAIN 60 tablet 3  . nitroGLYCERIN (NITROSTAT) 0.4 MG SL tablet Place 1 tablet (0.4 mg total) under the tongue every 5 (five) minutes as needed. For chest pain 30 tablet 0  . Ostomy Supplies (ALLKARE PROTECT BARRIER WIPES) MISC 1 application by Does not apply route 3 (three) times daily as needed. 100 each 1  . protective barrier (RESTORE) CREA Apply 1 application topically 2 (two) times daily. 120 g 1  . QC LANCETS SUPER THIN MISC TEST TWICE DAILY 100 each 2  . sertraline (ZOLOFT) 50 MG tablet TAKE 1/2 TABLET BY MOUTH EVERY DAY FOR 10 DAYS, THEN INCREASE TO A WHOLE TABLET DAILY 90 tablet 0  . simvastatin (ZOCOR) 20 MG tablet TAKE ONE TABLET BY MOUTH DAILY AT 6PM 30 tablet 11  . SYNTHROID 88 MCG tablet Take 1 tablet (88 mcg total) by mouth daily before breakfast. 30 tablet 3   No facility-administered medications prior to visit.    Allergies  Allergen Reactions  . Bee Venom Anaphylaxis  . Other Anaphylaxis  . Tetracyclines & Related Anaphylaxis  . Tetracycline     REACTION: arms swelling, itching, blisters  . Tramadol Other (See Comments)    Excess sedation     ROS Review of Systems    Objective:    Physical Exam  There were no vitals taken for this visit. Wt Readings from Last 3 Encounters:  08/11/19 127 lb (57.6 kg)  07/12/19 120 lb (54.4 kg)  05/25/19 127 lb (57.6 kg)     Health Maintenance Due   Topic Date Due  . URINE MICROALBUMIN  12/10/2017  . OPHTHALMOLOGY EXAM  03/06/2019  . FOOT EXAM  03/11/2019    There are no preventive care reminders to display for this patient.  Lab Results  Component Value Date   TSH 4.11 08/11/2019   Lab Results  Component Value Date   WBC 7.1 08/11/2019   HGB 11.3 (L) 08/11/2019   HCT 35.3 08/11/2019   MCV 93.4 08/11/2019   PLT 183 08/11/2019   Lab Results  Component Value Date   NA 142 08/11/2019   K 5.2 08/11/2019   CO2 30 08/11/2019   GLUCOSE 80 08/11/2019   BUN 36 (H) 08/11/2019   CREATININE 1.44 (H) 08/11/2019   BILITOT 0.2 08/11/2019   ALKPHOS 60 07/22/2016   AST 19 08/11/2019   ALT 14 08/11/2019   PROT 6.7 08/11/2019   ALBUMIN 3.8 07/22/2016   CALCIUM 10.2 08/11/2019   Lab Results  Component Value Date   CHOL 169 06/10/2018   Lab Results  Component Value Date   HDL 61 06/10/2018   Lab Results  Component Value Date   LDLCALC 89 06/10/2018   Lab Results  Component Value Date   TRIG 91 06/10/2018   Lab Results  Component Value Date   CHOLHDL 2.8 06/10/2018   Lab Results  Component Value Date   HGBA1C 5.6 08/11/2019      Assessment & Plan:   Problem List Items Addressed This Visit      Endocrine   Type 2 diabetes mellitus with complication, with long-term current use of insulin (  [place]) - Primary      No orders of the defined types were placed in this encounter.   Follow-up: No follow-ups on file.    Beatrice Lecher, MD

## 2019-11-17 ENCOUNTER — Other Ambulatory Visit: Payer: Self-pay | Admitting: Family Medicine

## 2019-11-17 ENCOUNTER — Other Ambulatory Visit: Payer: Self-pay

## 2019-11-17 DIAGNOSIS — E118 Type 2 diabetes mellitus with unspecified complications: Secondary | ICD-10-CM

## 2019-11-17 DIAGNOSIS — Z794 Long term (current) use of insulin: Secondary | ICD-10-CM

## 2019-11-17 MED ORDER — LANTUS SOLOSTAR 100 UNIT/ML ~~LOC~~ SOPN: PEN_INJECTOR | SUBCUTANEOUS | 0 refills | Status: DC

## 2019-11-17 NOTE — Telephone Encounter (Signed)
Previous RX from today for Lantus ordered under incorrect provider's name.  RX corrected.  Charyl Bigger, CMA

## 2019-12-02 ENCOUNTER — Telehealth: Payer: Self-pay

## 2019-12-02 DIAGNOSIS — Z794 Long term (current) use of insulin: Secondary | ICD-10-CM

## 2019-12-02 DIAGNOSIS — E118 Type 2 diabetes mellitus with unspecified complications: Secondary | ICD-10-CM

## 2019-12-02 NOTE — Telephone Encounter (Signed)
Heidi with [facility] with her insurance company called and states she needs home health wound care. She is requesting a referral for home care with [facility]. Please advise.

## 2019-12-03 NOTE — Telephone Encounter (Signed)
OK to place referra.

## 2019-12-03 NOTE — Telephone Encounter (Signed)
Referral placed.

## 2019-12-07 ENCOUNTER — Other Ambulatory Visit: Payer: Self-pay | Admitting: Family Medicine

## 2019-12-10 ENCOUNTER — Telehealth: Payer: Self-pay | Admitting: Family Medicine

## 2019-12-10 NOTE — Telephone Encounter (Signed)
I have Mrs. Theresa Huang scheduled for 40 mins on 3/31. PTs daughter asked for a referall to physical therapy to help her walk. Do we need to keep the appointment or just send it out?

## 2019-12-15 ENCOUNTER — Other Ambulatory Visit: Payer: Self-pay

## 2019-12-15 ENCOUNTER — Telehealth: Payer: Self-pay | Admitting: *Deleted

## 2019-12-15 ENCOUNTER — Other Ambulatory Visit: Payer: Self-pay | Admitting: Family Medicine

## 2019-12-15 ENCOUNTER — Ambulatory Visit (INDEPENDENT_AMBULATORY_CARE_PROVIDER_SITE_OTHER): Payer: Medicare HMO | Admitting: Family Medicine

## 2019-12-15 ENCOUNTER — Encounter: Payer: Self-pay | Admitting: Family Medicine

## 2019-12-15 ENCOUNTER — Telehealth: Payer: Self-pay | Admitting: Family Medicine

## 2019-12-15 ENCOUNTER — Ambulatory Visit (INDEPENDENT_AMBULATORY_CARE_PROVIDER_SITE_OTHER): Payer: Medicare HMO

## 2019-12-15 VITALS — BP 160/84 | HR 108 | Temp 97.4°F | Ht 64.17 in | Wt 118.0 lb

## 2019-12-15 DIAGNOSIS — G301 Alzheimer's disease with late onset: Secondary | ICD-10-CM

## 2019-12-15 DIAGNOSIS — Z794 Long term (current) use of insulin: Secondary | ICD-10-CM

## 2019-12-15 DIAGNOSIS — E118 Type 2 diabetes mellitus with unspecified complications: Secondary | ICD-10-CM

## 2019-12-15 DIAGNOSIS — N1831 Chronic kidney disease, stage 3a: Secondary | ICD-10-CM

## 2019-12-15 DIAGNOSIS — J9611 Chronic respiratory failure with hypoxia: Secondary | ICD-10-CM

## 2019-12-15 DIAGNOSIS — R000 Tachycardia, unspecified: Secondary | ICD-10-CM | POA: Diagnosis not present

## 2019-12-15 DIAGNOSIS — E038 Other specified hypothyroidism: Secondary | ICD-10-CM

## 2019-12-15 DIAGNOSIS — R0602 Shortness of breath: Secondary | ICD-10-CM

## 2019-12-15 DIAGNOSIS — R630 Anorexia: Secondary | ICD-10-CM

## 2019-12-15 DIAGNOSIS — Z993 Dependence on wheelchair: Secondary | ICD-10-CM

## 2019-12-15 DIAGNOSIS — F0280 Dementia in other diseases classified elsewhere without behavioral disturbance: Secondary | ICD-10-CM

## 2019-12-15 LAB — POCT GLYCOSYLATED HEMOGLOBIN (HGB A1C): Hemoglobin A1C: 5.9 % — AB (ref 4.0–5.6)

## 2019-12-15 MED ORDER — MEGESTROL ACETATE 625 MG/5ML PO SUSP: 625.0000 mg | Freq: Every day | ORAL | 2 refills | Status: AC

## 2019-12-15 NOTE — Telephone Encounter (Signed)
Please call her daughter [name] and let her know that I would like to take her off the sertraline that she is currently on. I am not sure that it is really helping much believes she is taking 50 mg if she is then okay to split and take a half a tab daily for 10 days, and then half a tab every other day for 6 days and then stop the medication completely.

## 2019-12-15 NOTE — Assessment & Plan Note (Signed)
A1c looks great today at 5.9.  We will have her decrease the Lantus down to 8 units from 12 since her eating has decreased significantly I am concerned about potential for hypoglycemia.

## 2019-12-15 NOTE — Assessment & Plan Note (Signed)
Symptomatic but due to recheck thyroid levels.

## 2019-12-15 NOTE — Assessment & Plan Note (Signed)
Does normally wear her oxygen at home but doesn't have it on today.

## 2019-12-15 NOTE — Assessment & Plan Note (Addendum)
Unclear etiology. Possibly from advancing dementia. It sounds like she is just having significant decrease in appetite suspect this is related to her decline and may be even some advancing of her dementia.  They are trying to do boost to help get up her calories.  I think that this is a great option.  Also discussed starting medication to help increase appetite. we will check her thyroid as well to see if medication might need to be adjusted.

## 2019-12-15 NOTE — Telephone Encounter (Signed)
Spoke w/pt's daughter and advised her of recommendations and she voiced understanding.Elouise Munroe, [place]

## 2019-12-15 NOTE — Assessment & Plan Note (Signed)
Due to recheck renal function. 

## 2019-12-15 NOTE — Progress Notes (Signed)
Established Patient Office Visit  Subjective:  Patient ID: Theresa Huang, female    DOB: 06-Nov-1930  Age: 86 y.o. MRN: [id number]  CC:  Chief Complaint  Patient presents with  . Follow-up    HPI Theresa Huang presents for   Follow-up diabetes-her daughter and son-in-law are here with her today.  They report that she really does not eat much her appetite has gone down significantly she will just nibble on food.  They have been trying to give her a boost drink daily.  She has lost weight and they are very concerned about it.  They are worried that she is also lost a lot of muscle mass.  She actually fell about a year ago and had a significant head injury and since then has ever has actually never walked again.  They feel very frustrated and disappointed that she never got the physical therapy help that she really should have received.  And when I last saw her we had tried to order physical therapy through home health.  Son-in-law reports that they would only work with her for about 10 or 15 minutes at the most and really were not aggressive at all in their approach and trying to get her walking again.  They are interested in maybe getting her into a formal rehab center.  They do not feel like they could take her to a rehab facility on a regular basis as it can take up to 45 minutes just to get her into the car.  She has been a little SOB.  Some mild cough and throat and clearing. No fevers, chills or sweats. No wheezing on exam.  Past Medical History:  Diagnosis Date  . Anemia   . Cancer ([place])    SCC  . Carotid artery occlusion   . COPD (chronic obstructive pulmonary disease) (HCC)    wears oxygen at 3L prn daytime and 3L at night   . DDD (degenerative disc disease)   . Diabetes mellitus (HCC)    fasting blood sugar 80-100  . Diastolic dysfunction   . Dizziness   . Glaucoma   . Headache(784.0)    migranes  . Hearing loss of both ears    wears bilateral hearing aids  .  Hemorrhoids   . Holter monitor, abnormal    wearing for 21 days  . Hyperlipidemia   . Hypothyroid   . Inflammatory polyps of colon with rectal bleeding ([place])   . Irregular heart beat   . Myocardial infarction ([place]) 2006  . Pneumonia    hx  . Retinopathy of left eye   . Shortness of breath    most of time  . Syncope     Past Surgical History:  Procedure Laterality Date  . ABDOMINAL HYSTERECTOMY    . CAROTID ENDARTERECTOMY    . COLONOSCOPY W/ POLYPECTOMY    . ENDARTERECTOMY  09/01/2012   Procedure: ENDARTERECTOMY CAROTID;  Surgeon: Conrad Ardmore, MD;  Location: [facility];  Service: Vascular;  Laterality: Right;  . EYE SURGERY     cataracts  . HEMORRHOID SURGERY    . PATCH ANGIOPLASTY  09/01/2012   Procedure: PATCH ANGIOPLASTY;  Surgeon: Conrad [place], MD;  Location: [facility];  Service: Vascular;  Laterality: Right;  Vascu-Guard Patch Angioplasty  . squamous cell removed    . THROAT SURGERY    . TONSILLECTOMY      Family History  Problem Relation Age of Onset  . Depression Mother   .  Diabetes Mother   . CAD Mother   . Heart disease Mother   . Hyperlipidemia Daughter   . CAD Father   . Heart disease Father   . Diabetes Brother   . Hypertension Son   . Breast cancer Daughter     Social History   Socioeconomic History  . Marital status: Widowed    Spouse name: Not on file  . Number of children: 4  . Years of education: 43  . Highest education level: 12th grade  Occupational History    Employer: RETIRED    Comment: nursing  Tobacco Use  . Smoking status: Former Smoker    Types: Cigarettes    Quit date: 09/17/1987    Years since quitting: 32.2  . Smokeless tobacco: Never Used  Substance and Sexual Activity  . Alcohol use: No  . Drug use: No  . Sexual activity: Not Currently  Other Topics Concern  . Not on file  Social History Narrative   Lives with daughter, doesn't go outside much because of weeds and snakes.Was diagnosed with dementia stays confused a lot per her  daughter who done visit for her   Social Determinants of Health   Financial Resource Strain:   . Difficulty of Paying Living Expenses:   Food Insecurity:   . Worried About Charity fundraiser in the Last Year:   . Arboriculturist in the Last Year:   Transportation Needs:   . Film/video editor (Medical):   [name] Lack of Transportation (Non-Medical):   Physical Activity:   . Days of Exercise per Week:   . Minutes of Exercise per Session:   Stress: No Stress Concern Present  . Feeling of Stress : Not at all  Social Connections: Unknown  . Frequency of Communication with Friends and Family: Once a week  . Frequency of Social Gatherings with Friends and Family: Not on file  . Attends Religious Services: Not on file  . Active Member of Clubs or Organizations: Not on file  . Attends Archivist Meetings: Not on file  . Marital Status: Not on file  Intimate Partner Violence:   . Fear of Current or Ex-Partner:   . Emotionally Abused:   [name] Physically Abused:   . Sexually Abused:     Outpatient Medications Prior to Visit  Medication Sig Dispense Refill  . acetaminophen (TYLENOL ARTHRITIS PAIN) 650 MG CR tablet Take 650 mg by mouth every 8 (eight) hours as needed. For pain    . albuterol (ACCUNEB) 1.25 MG/3ML nebulizer solution Take 3 mLs (1.25 mg total) by nebulization every 6 (six) hours as needed for wheezing. Please fax to [place]. 75 mL 12  . albuterol (PROVENTIL HFA;VENTOLIN HFA) 108 (90 Base) MCG/ACT inhaler INHALE 2 PUFFS INTO THE LUNGS EVERY 4 HOURS AS NEEDED FOR WHEEZING 18 g 3  . alendronate (FOSAMAX) 70 MG tablet TAKE ONE TABLET ONCE A WEEK 4 tablet 12  . amantadine (SYMMETREL) 50 MG/5ML solution TAKE 5ML BY MOUTH DAILY    . AMBULATORY NON FORMULARY MEDICATION Medication Name: glucometer strips to test BID.  Dx. 250.00 100 Units PRN  . AMBULATORY NON FORMULARY MEDICATION Medication Name: Portable oxygen - 2 liters. Dx. COPD.  Send to [place]. 1 vial 0  . AMBULATORY NON  FORMULARY MEDICATION Medication Name:  lightweight wheelchair with gel seat cushion, anti-tippers, brake lock extensions and elevated leg rests. DX: J44.1, Z99.3 1 each 0  . amLODipine (NORVASC) 2.5 MG tablet Take 2.5 mg by mouth  daily.    . aspirin 81 MG tablet Take 81 mg by mouth daily.      . Calcium-Vitamin D (CALTRATE 600 PLUS-VIT D PO) Take 1 tablet by mouth daily.     . cholecalciferol (VITAMIN D) 1000 units tablet Take 1,000 Units by mouth daily.    [name] donepezil (ARICEPT) 5 MG tablet Take 1 tablet by mouth at bedtime.    . Fluticasone-Umeclidin-Vilant (TRELEGY ELLIPTA) 100-62.5-25 MCG/INH AEPB Inhale 1 puff into the lungs daily. 60 each 11  . Insulin Glargine (LANTUS SOLOSTAR) 100 UNIT/ML Solostar Pen Inject 12 Units into the skin at bedtime. 15 mL 0  . Multiple Vitamin (MULTIVITAMIN) tablet Take 1 tablet by mouth daily.      . nitroGLYCERIN (NITROSTAT) 0.4 MG SL tablet Place 1 tablet (0.4 mg total) under the tongue every 5 (five) minutes as needed. For chest pain 30 tablet 0  . Ostomy Supplies (ALLKARE PROTECT BARRIER WIPES) MISC 1 application by Does not apply route 3 (three) times daily as needed. 100 each 1  . protective barrier (RESTORE) CREA Apply 1 application topically 2 (two) times daily. 120 g 1  . QC LANCETS SUPER THIN MISC TEST TWICE DAILY 100 each 2  . simvastatin (ZOCOR) 20 MG tablet TAKE ONE TABLET BY MOUTH DAILY AT 6PM 30 tablet 11  . TRUETRACK TEST test strip     . ALLERGY RELIEF 10 MG tablet TAKE 1 TABLET BY MOUTH ONCE DAILY 30 tablet 11  . AMBULATORY NON FORMULARY MEDICATION Medication Name: Hospital bed.  Unable to walk/non-ambulatroy. Please fax to [facility] agency 1 each 0  . AMBULATORY NON FORMULARY MEDICATION Bedside table for patient, Dx: J44.1, 1 each 0  . docusate sodium (COLACE) 100 MG capsule Take 1 capsule (100 mg total) by mouth 2 (two) times daily. 60 capsule 5  . insulin aspart (NOVOLOG) 100 UNIT/ML injection Inject into the skin.    . Lidocaine HCl 3 % LOTN Apply  topically as needed.    . naproxen (NAPROSYN) 500 MG tablet TAKE ONE TABLET BY MOUTH 2 TIMES A DAY WITH A MEAL AS NEEDED FOR JAW AND EAR PAIN 60 tablet 3  . sertraline (ZOLOFT) 50 MG tablet TAKE 1/2 TABLET BY MOUTH EVERY DAY FOR 10 DAYS, THEN INCREASE TO A WHOLE TABLET DAILY 90 tablet 0  . SYNTHROID 88 MCG tablet Take 1 tablet (88 mcg total) by mouth daily before breakfast. 30 tablet 3   No facility-administered medications prior to visit.    Allergies  Allergen Reactions  . Bee Venom Anaphylaxis  . Other Anaphylaxis  . Tetracyclines & Related Anaphylaxis  . Tetracycline     REACTION: arms swelling, itching, blisters  . Tramadol Other (See Comments)    Excess sedation     ROS Review of Systems    Objective:    Physical Exam  Constitutional: She is oriented to person, place, and time. She appears well-developed and well-nourished.  HENT:  Head: Normocephalic and atraumatic.  Eyes: Conjunctivae are normal.  Cardiovascular: Normal rate, regular rhythm and normal heart sounds.  Pulmonary/Chest: Effort normal.  Diffuse crackles but particularly in the left lung upper and lower, and right lower lung base  Neurological: She is alert and oriented to person, place, and time.  Skin: Skin is warm and dry.  Psychiatric: She has a normal mood and affect. Her behavior is normal.    BP (!) 160/84 (BP Location: Right Arm, Patient Position: Sitting, Cuff Size: Small)   Pulse (!) 108   Temp (!)  97.4 F (36.3 C) (Oral)   Ht 5' 4.17" (1.63 m)   Wt 118 lb (53.5 kg)   SpO2 92%   BMI 20.15 kg/m  Wt Readings from Last 3 Encounters:  12/15/19 118 lb (53.5 kg)  08/11/19 127 lb (57.6 kg)  07/12/19 120 lb (54.4 kg)     Health Maintenance Due  Topic Date Due  . URINE MICROALBUMIN  12/10/2017  . OPHTHALMOLOGY EXAM  03/06/2019  . FOOT EXAM  03/11/2019    There are no preventive care reminders to display for this patient.  Lab Results  Component Value Date   TSH 4.11 08/11/2019    Lab Results  Component Value Date   WBC 7.1 08/11/2019   HGB 11.3 (L) 08/11/2019   HCT 35.3 08/11/2019   MCV 93.4 08/11/2019   PLT 183 08/11/2019   Lab Results  Component Value Date   NA 142 08/11/2019   K 5.2 08/11/2019   CO2 30 08/11/2019   GLUCOSE 80 08/11/2019   BUN 36 (H) 08/11/2019   CREATININE 1.44 (H) 08/11/2019   BILITOT 0.2 08/11/2019   ALKPHOS 60 07/22/2016   AST 19 08/11/2019   ALT 14 08/11/2019   PROT 6.7 08/11/2019   ALBUMIN 3.8 07/22/2016   CALCIUM 10.2 08/11/2019   Lab Results  Component Value Date   CHOL 169 06/10/2018   Lab Results  Component Value Date   HDL 61 06/10/2018   Lab Results  Component Value Date   LDLCALC 89 06/10/2018   Lab Results  Component Value Date   TRIG 91 06/10/2018   Lab Results  Component Value Date   CHOLHDL 2.8 06/10/2018   Lab Results  Component Value Date   HGBA1C 5.9 (A) 12/15/2019      Assessment & Plan:   Problem List Items Addressed This Visit      Respiratory   Chronic hypoxemic respiratory failure ([place])    Does normally wear her oxygen at home but doesn't have it on today.          Endocrine   Type 2 diabetes mellitus with complication, with long-term current use of insulin (HCC) - Primary    A1c looks great today at 5.9.  We will have her decrease the Lantus down to 8 units from 12 since her eating has decreased significantly I am concerned about potential for hypoglycemia.      Relevant Orders   POCT HgB A1C (Completed)   TSH   COMPLETE METABOLIC PANEL WITH GFR   CBC with Differential/Platelet   Hypothyroidism    Symptomatic but due to recheck thyroid levels.      Relevant Orders   TSH   COMPLETE METABOLIC PANEL WITH GFR   CBC with Differential/Platelet     Nervous and Auditory   Dementia ([place])    Saw neurology about 5 months ago for advancing dementia and weight loss.        Genitourinary   CHRONIC KIDNEY DISEASE STAGE III (MODERATE)    Due to recheck renal function.          Other   Wheelchair dependence   Anorexia    Unclear etiology. Possibly from advancing dementia. It sounds like she is just having significant decrease in appetite suspect this is related to her decline and may be even some advancing of her dementia.  They are trying to do boost to help get up her calories.  I think that this is a great option.  Also discussed starting medication to help  increase appetite. we will check her thyroid as well to see if medication might need to be adjusted.       Other Visit Diagnoses    SOB (shortness of breath)       Relevant Orders   DG Chest 2 View   TSH   COMPLETE METABOLIC PANEL WITH GFR   CBC with Differential/Platelet   Tachycardia         Shortness of breath-I am concerned that she also has crackles on exam.  I do not see any evidence of volume overload in the extremities but I would like to start with a chest x-ray just to rule out infection, inflammation, mass etc.  We will provide letter requesting admission into a rehab facility at [facility]. Family reports that they checked with the facility themselves as well as their insurance. I discussed that typically this requires admission from the hospital to be referred to rehab facility. But I will be happy to write a request for her to be admitted and see if they are able to get her in.  Tachycardia-unclear if it may be because she is not wearing her oxygen though again she does have some crackles on lung exam today. EKG today shows rate of 128 bpm, normal sinus tachycardia with no acute ST-T wave changes. Some incomplete right bundle branch block.  I would also like to consider weaning her off her sertraline. I am not sure how much it is really helping at this point.  Meds ordered this encounter  Medications  . megestrol (MEGACE ES) 625 MG/5ML suspension    Sig: Take 5 mLs (625 mg total) by mouth daily.    Dispense:  150 mL    Refill:  2    Follow-up: Return in 2 months (on 02/14/2020)  for Weight Check .   Time spent 45 minutes in encounter including reviewing notes  Theresa Lecher, MD

## 2019-12-15 NOTE — Telephone Encounter (Signed)
Letter for rehab written for admission for [facility] faxed and confirmation received

## 2019-12-15 NOTE — Assessment & Plan Note (Signed)
Saw neurology about 5 months ago for advancing dementia and weight loss.

## 2019-12-16 ENCOUNTER — Other Ambulatory Visit: Payer: Self-pay | Admitting: Family Medicine

## 2019-12-16 LAB — CBC WITH DIFFERENTIAL/PLATELET
Absolute Monocytes: 307 cells/uL (ref 200–950)
Basophils Absolute: 41 cells/uL (ref 0–200)
Basophils Relative: 0.7 %
Eosinophils Absolute: 189 cells/uL (ref 15–500)
Eosinophils Relative: 3.2 %
HCT: 37.6 % (ref 35.0–45.0)
Hemoglobin: 12.2 g/dL (ref 11.7–15.5)
Lymphs Abs: 1605 cells/uL (ref 850–3900)
MCH: 30.5 pg (ref 27.0–33.0)
MCHC: 32.4 g/dL (ref 32.0–36.0)
MCV: 94 fL (ref 80.0–100.0)
MPV: 11 fL (ref 7.5–12.5)
Monocytes Relative: 5.2 %
Neutro Abs: 3758 cells/uL (ref 1500–7800)
Neutrophils Relative %: 63.7 %
Platelets: 225 10*3/uL (ref 140–400)
RBC: 4 10*6/uL (ref 3.80–5.10)
RDW: 13 % (ref 11.0–15.0)
Total Lymphocyte: 27.2 %
WBC: 5.9 10*3/uL (ref 3.8–10.8)

## 2019-12-16 LAB — COMPLETE METABOLIC PANEL WITH GFR
AG Ratio: 1.4 (calc) (ref 1.0–2.5)
ALT: 14 U/L (ref 6–29)
AST: 16 U/L (ref 10–35)
Albumin: 4.2 g/dL (ref 3.6–5.1)
Alkaline phosphatase (APISO): 80 U/L (ref 37–153)
BUN/Creatinine Ratio: 20 (calc) (ref 6–22)
BUN: 24 mg/dL (ref 7–25)
CO2: 31 mmol/L (ref 20–32)
Calcium: 10 mg/dL (ref 8.6–10.4)
Chloride: 102 mmol/L (ref 98–110)
Creat: 1.2 mg/dL — ABNORMAL HIGH (ref 0.60–0.88)
GFR, Est African American: 47 mL/min/{1.73_m2} — ABNORMAL LOW (ref >=60)
GFR, Est Non African American: 41 mL/min/{1.73_m2} — ABNORMAL LOW (ref >=60)
Globulin: 3 g/dL (calc) (ref 1.9–3.7)
Glucose, Bld: 227 mg/dL — ABNORMAL HIGH (ref 65–99)
Potassium: 4.8 mmol/L (ref 3.5–5.3)
Sodium: 141 mmol/L (ref 135–146)
Total Bilirubin: 0.3 mg/dL (ref 0.2–1.2)
Total Protein: 7.2 g/dL (ref 6.1–8.1)

## 2019-12-16 LAB — TSH: TSH: 1.98 mIU/L (ref 0.40–4.50)

## 2019-12-27 ENCOUNTER — Other Ambulatory Visit: Payer: Self-pay | Admitting: Family Medicine

## 2019-12-30 NOTE — Addendum Note (Signed)
Addended by: Narda Rutherford on: 12/30/2019 08:11 AM   Modules accepted: Orders

## 2020-01-06 ENCOUNTER — Telehealth: Payer: Self-pay

## 2020-01-06 NOTE — Telephone Encounter (Signed)
Phung's daughter called and states Theresa Huang is being transported by EMS to ED due to shortness of breath.

## 2020-01-19 DIAGNOSIS — R0602 Shortness of breath: Secondary | ICD-10-CM | POA: Diagnosis not present

## 2020-01-19 DIAGNOSIS — I4819 Other persistent atrial fibrillation: Secondary | ICD-10-CM | POA: Diagnosis not present

## 2020-01-19 DIAGNOSIS — D649 Anemia, unspecified: Secondary | ICD-10-CM | POA: Diagnosis not present

## 2020-01-19 DIAGNOSIS — R6889 Other general symptoms and signs: Secondary | ICD-10-CM | POA: Diagnosis not present

## 2020-01-20 DIAGNOSIS — E119 Type 2 diabetes mellitus without complications: Secondary | ICD-10-CM | POA: Diagnosis not present

## 2020-01-20 DIAGNOSIS — N3941 Urge incontinence: Secondary | ICD-10-CM | POA: Diagnosis not present

## 2020-01-21 DIAGNOSIS — E785 Hyperlipidemia, unspecified: Secondary | ICD-10-CM | POA: Diagnosis not present

## 2020-01-21 DIAGNOSIS — D649 Anemia, unspecified: Secondary | ICD-10-CM | POA: Diagnosis not present

## 2020-01-21 DIAGNOSIS — I4819 Other persistent atrial fibrillation: Secondary | ICD-10-CM | POA: Diagnosis not present

## 2020-01-21 DIAGNOSIS — D519 Vitamin B12 deficiency anemia, unspecified: Secondary | ICD-10-CM | POA: Diagnosis not present

## 2020-01-21 DIAGNOSIS — Z79899 Other long term (current) drug therapy: Secondary | ICD-10-CM | POA: Diagnosis not present

## 2020-01-24 DIAGNOSIS — D649 Anemia, unspecified: Secondary | ICD-10-CM | POA: Diagnosis not present

## 2020-01-24 DIAGNOSIS — I4819 Other persistent atrial fibrillation: Secondary | ICD-10-CM | POA: Diagnosis not present

## 2020-01-24 DIAGNOSIS — R0602 Shortness of breath: Secondary | ICD-10-CM | POA: Diagnosis not present

## 2020-01-28 DIAGNOSIS — J95821 Acute postprocedural respiratory failure: Secondary | ICD-10-CM | POA: Diagnosis not present

## 2020-01-28 DIAGNOSIS — E119 Type 2 diabetes mellitus without complications: Secondary | ICD-10-CM | POA: Diagnosis not present

## 2020-01-28 DIAGNOSIS — D649 Anemia, unspecified: Secondary | ICD-10-CM | POA: Diagnosis not present

## 2020-01-28 DIAGNOSIS — J449 Chronic obstructive pulmonary disease, unspecified: Secondary | ICD-10-CM | POA: Diagnosis not present

## 2020-01-28 DIAGNOSIS — I10 Essential (primary) hypertension: Secondary | ICD-10-CM | POA: Diagnosis not present

## 2020-01-28 DIAGNOSIS — I4891 Unspecified atrial fibrillation: Secondary | ICD-10-CM | POA: Diagnosis not present

## 2020-01-28 DIAGNOSIS — F0390 Unspecified dementia without behavioral disturbance: Secondary | ICD-10-CM | POA: Diagnosis not present

## 2020-01-28 DIAGNOSIS — I4819 Other persistent atrial fibrillation: Secondary | ICD-10-CM | POA: Diagnosis not present

## 2020-01-28 DIAGNOSIS — J9691 Respiratory failure, unspecified with hypoxia: Secondary | ICD-10-CM | POA: Diagnosis not present

## 2020-01-30 DIAGNOSIS — J9611 Chronic respiratory failure with hypoxia: Secondary | ICD-10-CM | POA: Diagnosis not present

## 2020-01-30 DIAGNOSIS — E1122 Type 2 diabetes mellitus with diabetic chronic kidney disease: Secondary | ICD-10-CM | POA: Diagnosis not present

## 2020-01-30 DIAGNOSIS — N183 Chronic kidney disease, stage 3 unspecified: Secondary | ICD-10-CM | POA: Diagnosis not present

## 2020-01-30 DIAGNOSIS — J449 Chronic obstructive pulmonary disease, unspecified: Secondary | ICD-10-CM | POA: Diagnosis not present

## 2020-01-30 DIAGNOSIS — F0390 Unspecified dementia without behavioral disturbance: Secondary | ICD-10-CM | POA: Diagnosis not present

## 2020-01-30 DIAGNOSIS — I129 Hypertensive chronic kidney disease with stage 1 through stage 4 chronic kidney disease, or unspecified chronic kidney disease: Secondary | ICD-10-CM | POA: Diagnosis not present

## 2020-01-30 DIAGNOSIS — I252 Old myocardial infarction: Secondary | ICD-10-CM | POA: Diagnosis not present

## 2020-01-30 DIAGNOSIS — I482 Chronic atrial fibrillation, unspecified: Secondary | ICD-10-CM | POA: Diagnosis not present

## 2020-01-30 DIAGNOSIS — J9601 Acute respiratory failure with hypoxia: Secondary | ICD-10-CM | POA: Diagnosis not present

## 2020-01-30 DIAGNOSIS — I080 Rheumatic disorders of both mitral and aortic valves: Secondary | ICD-10-CM | POA: Diagnosis not present

## 2020-01-31 ENCOUNTER — Other Ambulatory Visit: Payer: Self-pay

## 2020-01-31 ENCOUNTER — Telehealth: Payer: Self-pay

## 2020-01-31 DIAGNOSIS — N183 Chronic kidney disease, stage 3 unspecified: Secondary | ICD-10-CM | POA: Diagnosis not present

## 2020-01-31 DIAGNOSIS — F0390 Unspecified dementia without behavioral disturbance: Secondary | ICD-10-CM | POA: Diagnosis not present

## 2020-01-31 DIAGNOSIS — I252 Old myocardial infarction: Secondary | ICD-10-CM | POA: Diagnosis not present

## 2020-01-31 DIAGNOSIS — J449 Chronic obstructive pulmonary disease, unspecified: Secondary | ICD-10-CM | POA: Diagnosis not present

## 2020-01-31 DIAGNOSIS — I129 Hypertensive chronic kidney disease with stage 1 through stage 4 chronic kidney disease, or unspecified chronic kidney disease: Secondary | ICD-10-CM | POA: Diagnosis not present

## 2020-01-31 DIAGNOSIS — J9611 Chronic respiratory failure with hypoxia: Secondary | ICD-10-CM | POA: Diagnosis not present

## 2020-01-31 DIAGNOSIS — E1122 Type 2 diabetes mellitus with diabetic chronic kidney disease: Secondary | ICD-10-CM | POA: Diagnosis not present

## 2020-01-31 DIAGNOSIS — I080 Rheumatic disorders of both mitral and aortic valves: Secondary | ICD-10-CM | POA: Diagnosis not present

## 2020-01-31 DIAGNOSIS — I482 Chronic atrial fibrillation, unspecified: Secondary | ICD-10-CM | POA: Diagnosis not present

## 2020-01-31 MED ORDER — NITROGLYCERIN 0.4 MG SL SUBL
0.4000 mg | SUBLINGUAL_TABLET | SUBLINGUAL | 3 refills | Status: DC | PRN
Start: 2020-01-31 — End: 2020-02-04

## 2020-01-31 NOTE — Telephone Encounter (Signed)
Yes okay for treatment

## 2020-01-31 NOTE — Telephone Encounter (Signed)
Theresa Huang with Theresa Huang wants verbal orders to start PT. Patient was just released from rehab. Please advise.

## 2020-02-01 ENCOUNTER — Other Ambulatory Visit: Payer: Self-pay | Admitting: Family Medicine

## 2020-02-01 ENCOUNTER — Ambulatory Visit: Payer: Medicare HMO | Admitting: Family Medicine

## 2020-02-01 DIAGNOSIS — Z993 Dependence on wheelchair: Secondary | ICD-10-CM | POA: Diagnosis not present

## 2020-02-01 DIAGNOSIS — J438 Other emphysema: Secondary | ICD-10-CM | POA: Diagnosis not present

## 2020-02-01 NOTE — Telephone Encounter (Signed)
Michelle advised

## 2020-02-04 ENCOUNTER — Ambulatory Visit (INDEPENDENT_AMBULATORY_CARE_PROVIDER_SITE_OTHER): Payer: Medicare HMO | Admitting: Family Medicine

## 2020-02-04 ENCOUNTER — Encounter: Payer: Self-pay | Admitting: Family Medicine

## 2020-02-04 VITALS — BP 117/99 | HR 94 | Ht 64.0 in | Wt 123.0 lb

## 2020-02-04 DIAGNOSIS — N1831 Chronic kidney disease, stage 3a: Secondary | ICD-10-CM | POA: Diagnosis not present

## 2020-02-04 DIAGNOSIS — E118 Type 2 diabetes mellitus with unspecified complications: Secondary | ICD-10-CM

## 2020-02-04 DIAGNOSIS — I4891 Unspecified atrial fibrillation: Secondary | ICD-10-CM | POA: Diagnosis not present

## 2020-02-04 DIAGNOSIS — Z794 Long term (current) use of insulin: Secondary | ICD-10-CM

## 2020-02-04 DIAGNOSIS — I4819 Other persistent atrial fibrillation: Secondary | ICD-10-CM

## 2020-02-04 DIAGNOSIS — Z993 Dependence on wheelchair: Secondary | ICD-10-CM

## 2020-02-04 DIAGNOSIS — Z7901 Long term (current) use of anticoagulants: Secondary | ICD-10-CM | POA: Diagnosis not present

## 2020-02-04 DIAGNOSIS — Z515 Encounter for palliative care: Secondary | ICD-10-CM | POA: Diagnosis not present

## 2020-02-04 DIAGNOSIS — G8220 Paraplegia, unspecified: Secondary | ICD-10-CM | POA: Diagnosis not present

## 2020-02-04 NOTE — Progress Notes (Signed)
vb   Established Patient Office Visit  Subjective:  Patient ID: Theresa Huang, female    DOB: 08/16/1931  Age: 86 y.o. MRN: [id number]  CC:  Chief Complaint  Patient presents with  . Follow-up    HPI Theresa Huang presents for hospital follow-up.  Patient was recently admitted to [facility] on April 22 for atrial fibrillation with RVR.  She had sudden worsening of shortness of breath.  She was discharged home 4 days later on April 26.  She typically wears about 3-1/2 L of oxygen at home.  She had worsening shortness of breath so they tried increasing her to 4 to 5 L and she still felt short of breath so they took her to the emergency department.  She was diagnosed with atrial fibrillation and oxygen was was increased to 6 L.  She was negative for Covid.  She did have a mild elevation in her creatinine to 1.25.  She was started on diltiazem drip and admitted to hospital.  CHA2DS2-VASc score was 7 so she was started on Eliquis.  2.5 mg daily.  She also had an echocardiogram on April 23 showing EF of 55 to 60% with trace mitral regurg and mild aortic stenosis.  She does have a follow-up with cardiology in August.  Diabetes-she was then discharged to a rehab center and was there from April 26 through May 15.  She was discharged on 16 units of Lantus.  They actually ran out of needles so they did not give it for couple days and the blood sugars were just above 100.  She did pick up the needles and was able to give 16 units last night but then she woke up with a glucose of 67 this morning.  Daughter notes that when she gets her 16 units that she seems very sedated and drowsy.  I try to get her back on track with sleeping at night.  Past Medical History:  Diagnosis Date  . Anemia   . Cancer ([place])    SCC  . Carotid artery occlusion   . COPD (chronic obstructive pulmonary disease) (HCC)    wears oxygen at 3L prn daytime and 3L at night   . DDD (degenerative disc disease)   . Diabetes  mellitus (HCC)    fasting blood sugar 80-100  . Diastolic dysfunction   . Dizziness   . Glaucoma   . Headache(784.0)    migranes  . Hearing loss of both ears    wears bilateral hearing aids  . Hemorrhoids   . Holter monitor, abnormal    wearing for 21 days  . Hyperlipidemia   . Hypothyroid   . Inflammatory polyps of colon with rectal bleeding ([place])   . Irregular heart beat   . Myocardial infarction ([place]) 2006  . Pneumonia    hx  . Retinopathy of left eye   . Shortness of breath    most of time  . Syncope     Past Surgical History:  Procedure Laterality Date  . ABDOMINAL HYSTERECTOMY    . CAROTID ENDARTERECTOMY    . COLONOSCOPY W/ POLYPECTOMY    . ENDARTERECTOMY  09/01/2012   Procedure: ENDARTERECTOMY CAROTID;  Surgeon: Conrad Waynesfield, MD;  Location: [facility];  Service: Vascular;  Laterality: Right;  . EYE SURGERY     cataracts  . HEMORRHOID SURGERY    . PATCH ANGIOPLASTY  09/01/2012   Procedure: PATCH ANGIOPLASTY;  Surgeon: Conrad Questa, MD;  Location: [facility];  Service:  Vascular;  Laterality: Right;  Vascu-Guard Patch Angioplasty  . squamous cell removed    . THROAT SURGERY    . TONSILLECTOMY      Family History  Problem Relation Age of Onset  . Depression Mother   . Diabetes Mother   . CAD Mother   . Heart disease Mother   . Hyperlipidemia Daughter   . CAD Father   . Heart disease Father   . Diabetes Brother   . Hypertension Son   . Breast cancer Daughter     Social History   Socioeconomic History  . Marital status: Widowed    Spouse name: Not on file  . Number of children: 4  . Years of education: 32  . Highest education level: 12th grade  Occupational History    Employer: RETIRED    Comment: nursing  Tobacco Use  . Smoking status: Former Smoker    Types: Cigarettes    Quit date: 09/17/1987    Years since quitting: 32.4  . Smokeless tobacco: Never Used  Substance and Sexual Activity  . Alcohol use: No  . Drug use: No  . Sexual activity: Not  Currently  Other Topics Concern  . Not on file  Social History Narrative   Lives with daughter, doesn't go outside much because of weeds and snakes.Was diagnosed with dementia stays confused a lot per her daughter who done visit for her   Social Determinants of Health   Financial Resource Strain:   . Difficulty of Paying Living Expenses:   Food Insecurity:   . Worried About Charity fundraiser in the Last Year:   . Arboriculturist in the Last Year:   Transportation Needs:   . Film/video editor (Medical):   [name] Lack of Transportation (Non-Medical):   Physical Activity:   . Days of Exercise per Week:   . Minutes of Exercise per Session:   Stress: No Stress Concern Present  . Feeling of Stress : Not at all  Social Connections: Unknown  . Frequency of Communication with Friends and Family: Once a week  . Frequency of Social Gatherings with Friends and Family: Not on file  . Attends Religious Services: Not on file  . Active Member of Clubs or Organizations: Not on file  . Attends Archivist Meetings: Not on file  . Marital Status: Not on file  Intimate Partner Violence:   . Fear of Current or Ex-Partner:   . Emotionally Abused:   [name] Physically Abused:   . Sexually Abused:     Outpatient Medications Prior to Visit  Medication Sig Dispense Refill  . acetaminophen (TYLENOL ARTHRITIS PAIN) 650 MG CR tablet Take 650 mg by mouth every 8 (eight) hours as needed. For pain    . albuterol (ACCUNEB) 1.25 MG/3ML nebulizer solution Take 3 mLs (1.25 mg total) by nebulization every 6 (six) hours as needed for wheezing. Please fax to [facility]. 75 mL 12  . albuterol (VENTOLIN HFA) 108 (90 Base) MCG/ACT inhaler INHALE 2 PUFFS INTO THE LUNGS EVERY 4 HOURS AS NEEDED FOR WHEEZING 18 g 3  . alendronate (FOSAMAX) 70 MG tablet TAKE ONE TABLET ONCE A WEEK 4 tablet 12  . ALLERGY RELIEF 10 MG tablet TAKE 1 TABLET BY MOUTH ONCE DAILY 30 tablet 11  . amantadine (SYMMETREL) 50 MG/5ML solution TAKE  5ML BY MOUTH DAILY    . AMBULATORY NON FORMULARY MEDICATION Medication Name: glucometer strips to test BID.  Dx. 250.00 100 Units PRN  . AMBULATORY  NON FORMULARY MEDICATION Medication Name: Portable oxygen - 2 liters. Dx. COPD.  Send to [facility]. (Patient taking differently: Medication Name: Portable oxygen - 3.5 liters. Dx. COPD.  Send to [facility].) 1 vial 0  . AMBULATORY NON FORMULARY MEDICATION Medication Name:  lightweight wheelchair with gel seat cushion, anti-tippers, brake lock extensions and elevated leg rests. DX: J44.1, Z99.3 1 each 0  . amLODipine (NORVASC) 2.5 MG tablet Take 2.5 mg by mouth daily.    [name] apixaban (ELIQUIS) 2.5 MG TABS tablet Take 2.5 mg by mouth 2 (two) times daily.    [name] aspirin 81 MG tablet Take 81 mg by mouth daily.      . Calcium-Vitamin D (CALTRATE 600 PLUS-VIT D PO) Take 1 tablet by mouth daily.     . cholecalciferol (VITAMIN D) 1000 units tablet Take 1,000 Units by mouth daily.    [name] docusate sodium (COLACE) 100 MG capsule Take 1 capsule (100 mg total) by mouth 2 (two) times daily. 60 capsule 5  . donepezil (ARICEPT) 5 MG tablet Take 1 tablet by mouth at bedtime.    . Fluticasone-Umeclidin-Vilant (TRELEGY ELLIPTA) 100-62.5-25 MCG/INH AEPB Inhale 1 puff into the lungs daily. 60 each 11  . Insulin Glargine (LANTUS SOLOSTAR) 100 UNIT/ML Solostar Pen Inject 12 Units into the skin at bedtime. 15 mL 0  . Lancets (UNILET COMFORTOUCH LANCET) MISC TEST TWICE DAILY 100 each 2  . megestrol (MEGACE ES) 625 MG/5ML suspension Take 5 mLs (625 mg total) by mouth daily. 150 mL 2  . Multiple Vitamin (MULTIVITAMIN) tablet Take 1 tablet by mouth daily.      . Ostomy Supplies (ALLKARE PROTECT BARRIER WIPES) MISC 1 application by Does not apply route 3 (three) times daily as needed. 100 each 1  . protective barrier (RESTORE) CREA Apply 1 application topically 2 (two) times daily. 120 g 1  . sertraline (ZOLOFT) 50 MG tablet Take 1 tablet (50 mg total) by mouth daily. TAKE 1/2 TABLET BY MOUTH  EVERY DAY FOR 10 DAYS, THEN INCREASE TO A WHOLE TABLET DAILY 90 tablet 0  . simvastatin (ZOCOR) 20 MG tablet TAKE ONE TABLET BY MOUTH DAILY AT 6PM 30 tablet 11  . SYNTHROID 88 MCG tablet Take 1 tablet (88 mcg total) by mouth daily before breakfast. 30 tablet 3  . TRUETRACK TEST test strip     . UNIFINE PENTIPS 29G X 12MM MISC USE ONCE DAILY AS DIRECTED 100 each 11  . nitroGLYCERIN (NITROSTAT) 0.4 MG SL tablet Place 1 tablet (0.4 mg total) under the tongue every 5 (five) minutes as needed. For chest pain (Patient not taking: Reported on 02/04/2020) 30 tablet 0  . nitroGLYCERIN (NITROSTAT) 0.4 MG SL tablet Place 1 tablet (0.4 mg total) under the tongue every 5 (five) minutes as needed for chest pain. 50 tablet 3   No facility-administered medications prior to visit.    Allergies  Allergen Reactions  . Bee Venom Anaphylaxis  . Other Anaphylaxis  . Tetracyclines & Related Anaphylaxis  . Tetracycline     REACTION: arms swelling, itching, blisters  . Tramadol Other (See Comments)    Excess sedation     ROS Review of Systems    Objective:    Physical Exam  Constitutional: She is oriented to person, place, and time. She appears well-developed and well-nourished.  HENT:  Head: Normocephalic and atraumatic.  Eyes: Conjunctivae are normal.  Cardiovascular: Normal rate and normal heart sounds.  Irregular rhythm.  Pulmonary/Chest: Effort normal and breath sounds normal.  Musculoskeletal:  Cervical back: Neck supple.  Neurological: She is alert and oriented to person, place, and time.  Skin: Skin is warm and dry.  Psychiatric: She has a normal mood and affect. Her behavior is normal.    BP (!) 117/99   Pulse 94   Ht 5\' 4"  (1.626 m)   Wt 123 lb (55.8 kg)   SpO2 95%   BMI 21.11 kg/m  Wt Readings from Last 3 Encounters:  02/04/20 123 lb (55.8 kg)  12/15/19 118 lb (53.5 kg)  08/11/19 127 lb (57.6 kg)     Health Maintenance Due  Topic Date Due  . URINE MICROALBUMIN   12/10/2017  . OPHTHALMOLOGY EXAM  03/06/2019  . FOOT EXAM  03/11/2019    There are no preventive care reminders to display for this patient.  Lab Results  Component Value Date   TSH 1.98 12/15/2019   Lab Results  Component Value Date   WBC 5.9 12/15/2019   HGB 12.2 12/15/2019   HCT 37.6 12/15/2019   MCV 94.0 12/15/2019   PLT 225 12/15/2019   Lab Results  Component Value Date   NA 141 12/15/2019   K 4.8 12/15/2019   CO2 31 12/15/2019   GLUCOSE 227 (H) 12/15/2019   BUN 24 12/15/2019   CREATININE 1.20 (H) 12/15/2019   BILITOT 0.3 12/15/2019   ALKPHOS 60 07/22/2016   AST 16 12/15/2019   ALT 14 12/15/2019   PROT 7.2 12/15/2019   ALBUMIN 3.8 07/22/2016   CALCIUM 10.0 12/15/2019   Lab Results  Component Value Date   CHOL 169 06/10/2018   Lab Results  Component Value Date   HDL 61 06/10/2018   Lab Results  Component Value Date   LDLCALC 89 06/10/2018   Lab Results  Component Value Date   TRIG 91 06/10/2018   Lab Results  Component Value Date   CHOLHDL 2.8 06/10/2018   Lab Results  Component Value Date   HGBA1C 5.9 (A) 12/15/2019      Assessment & Plan:   Problem List Items Addressed This Visit      Cardiovascular and Mediastinum   Persistent atrial fibrillation ([place])    Oertli rate controlled with metoprolol. Continue eliquis.        Relevant Medications   apixaban (ELIQUIS) 2.5 MG TABS tablet     Endocrine   Type 2 diabetes mellitus with complication, with long-term current use of insulin ([place]) - Primary    Diabetes.  Prior A1c looks great at 5.9.  For now we will hold long-acting insulin completely for the next several weeks and see how well she does without it.  Did discuss with her daughter that if she needs to restart it to give an occasional dose because of high blood sugars to just give 10 units, not the 16.  Plan to follow-up in 3 months.        Genitourinary   CHRONIC KIDNEY DISEASE STAGE III (MODERATE)    Plan to recheck renal  function at f/U visit in 3 months.          Other   Wheelchair dependence    Daughter has been encouraging her to walk at least 10 or 15 feet twice a day which she has been able to do since she got home from rehab.         No orders of the defined types were placed in this encounter.   Follow-up: Return in about 3 months (around 05/06/2020) for Diabetes follow-up.   Spent 35  minutes in encounter including reviewing hospital notes.  Beatrice Lecher, MD

## 2020-02-04 NOTE — Assessment & Plan Note (Signed)
Diabetes.  Prior A1c looks great at 5.9.  For now we will hold long-acting insulin completely for the next several weeks and see how well she does without it.  Did discuss with her daughter that if she needs to restart it to give an occasional dose because of high blood sugars to just give 10 units, not the 16.  Plan to follow-up in 3 months.

## 2020-02-04 NOTE — Assessment & Plan Note (Signed)
Plan to recheck renal function at f/U visit in 3 months.

## 2020-02-04 NOTE — Assessment & Plan Note (Signed)
Daughter has been encouraging her to walk at least 10 or 15 feet twice a day which she has been able to do since she got home from rehab.

## 2020-02-04 NOTE — Assessment & Plan Note (Signed)
Oertli rate controlled with metoprolol. Continue eliquis.

## 2020-02-08 DIAGNOSIS — I482 Chronic atrial fibrillation, unspecified: Secondary | ICD-10-CM | POA: Diagnosis not present

## 2020-02-08 DIAGNOSIS — J449 Chronic obstructive pulmonary disease, unspecified: Secondary | ICD-10-CM | POA: Diagnosis not present

## 2020-02-08 DIAGNOSIS — F0390 Unspecified dementia without behavioral disturbance: Secondary | ICD-10-CM | POA: Diagnosis not present

## 2020-02-08 DIAGNOSIS — E1122 Type 2 diabetes mellitus with diabetic chronic kidney disease: Secondary | ICD-10-CM | POA: Diagnosis not present

## 2020-02-08 DIAGNOSIS — I080 Rheumatic disorders of both mitral and aortic valves: Secondary | ICD-10-CM | POA: Diagnosis not present

## 2020-02-08 DIAGNOSIS — I252 Old myocardial infarction: Secondary | ICD-10-CM | POA: Diagnosis not present

## 2020-02-08 DIAGNOSIS — J9611 Chronic respiratory failure with hypoxia: Secondary | ICD-10-CM | POA: Diagnosis not present

## 2020-02-08 DIAGNOSIS — I129 Hypertensive chronic kidney disease with stage 1 through stage 4 chronic kidney disease, or unspecified chronic kidney disease: Secondary | ICD-10-CM | POA: Diagnosis not present

## 2020-02-08 DIAGNOSIS — N183 Chronic kidney disease, stage 3 unspecified: Secondary | ICD-10-CM | POA: Diagnosis not present

## 2020-02-09 DIAGNOSIS — J449 Chronic obstructive pulmonary disease, unspecified: Secondary | ICD-10-CM | POA: Diagnosis not present

## 2020-02-09 DIAGNOSIS — I080 Rheumatic disorders of both mitral and aortic valves: Secondary | ICD-10-CM | POA: Diagnosis not present

## 2020-02-09 DIAGNOSIS — N183 Chronic kidney disease, stage 3 unspecified: Secondary | ICD-10-CM | POA: Diagnosis not present

## 2020-02-09 DIAGNOSIS — I482 Chronic atrial fibrillation, unspecified: Secondary | ICD-10-CM | POA: Diagnosis not present

## 2020-02-09 DIAGNOSIS — J9611 Chronic respiratory failure with hypoxia: Secondary | ICD-10-CM | POA: Diagnosis not present

## 2020-02-09 DIAGNOSIS — F0390 Unspecified dementia without behavioral disturbance: Secondary | ICD-10-CM | POA: Diagnosis not present

## 2020-02-09 DIAGNOSIS — E1122 Type 2 diabetes mellitus with diabetic chronic kidney disease: Secondary | ICD-10-CM | POA: Diagnosis not present

## 2020-02-09 DIAGNOSIS — I252 Old myocardial infarction: Secondary | ICD-10-CM | POA: Diagnosis not present

## 2020-02-09 DIAGNOSIS — I129 Hypertensive chronic kidney disease with stage 1 through stage 4 chronic kidney disease, or unspecified chronic kidney disease: Secondary | ICD-10-CM | POA: Diagnosis not present

## 2020-02-10 DIAGNOSIS — N183 Chronic kidney disease, stage 3 unspecified: Secondary | ICD-10-CM | POA: Diagnosis not present

## 2020-02-10 DIAGNOSIS — F0390 Unspecified dementia without behavioral disturbance: Secondary | ICD-10-CM | POA: Diagnosis not present

## 2020-02-10 DIAGNOSIS — I129 Hypertensive chronic kidney disease with stage 1 through stage 4 chronic kidney disease, or unspecified chronic kidney disease: Secondary | ICD-10-CM | POA: Diagnosis not present

## 2020-02-10 DIAGNOSIS — J449 Chronic obstructive pulmonary disease, unspecified: Secondary | ICD-10-CM | POA: Diagnosis not present

## 2020-02-10 DIAGNOSIS — I252 Old myocardial infarction: Secondary | ICD-10-CM | POA: Diagnosis not present

## 2020-02-10 DIAGNOSIS — E1122 Type 2 diabetes mellitus with diabetic chronic kidney disease: Secondary | ICD-10-CM | POA: Diagnosis not present

## 2020-02-10 DIAGNOSIS — J9611 Chronic respiratory failure with hypoxia: Secondary | ICD-10-CM | POA: Diagnosis not present

## 2020-02-10 DIAGNOSIS — I482 Chronic atrial fibrillation, unspecified: Secondary | ICD-10-CM | POA: Diagnosis not present

## 2020-02-10 DIAGNOSIS — I080 Rheumatic disorders of both mitral and aortic valves: Secondary | ICD-10-CM | POA: Diagnosis not present

## 2020-02-13 DIAGNOSIS — J449 Chronic obstructive pulmonary disease, unspecified: Secondary | ICD-10-CM | POA: Diagnosis not present

## 2020-02-13 DIAGNOSIS — J9611 Chronic respiratory failure with hypoxia: Secondary | ICD-10-CM | POA: Diagnosis not present

## 2020-02-15 DIAGNOSIS — F0390 Unspecified dementia without behavioral disturbance: Secondary | ICD-10-CM | POA: Diagnosis not present

## 2020-02-15 DIAGNOSIS — I080 Rheumatic disorders of both mitral and aortic valves: Secondary | ICD-10-CM | POA: Diagnosis not present

## 2020-02-15 DIAGNOSIS — E1122 Type 2 diabetes mellitus with diabetic chronic kidney disease: Secondary | ICD-10-CM | POA: Diagnosis not present

## 2020-02-15 DIAGNOSIS — J449 Chronic obstructive pulmonary disease, unspecified: Secondary | ICD-10-CM | POA: Diagnosis not present

## 2020-02-15 DIAGNOSIS — I252 Old myocardial infarction: Secondary | ICD-10-CM | POA: Diagnosis not present

## 2020-02-15 DIAGNOSIS — N183 Chronic kidney disease, stage 3 unspecified: Secondary | ICD-10-CM | POA: Diagnosis not present

## 2020-02-15 DIAGNOSIS — J9611 Chronic respiratory failure with hypoxia: Secondary | ICD-10-CM | POA: Diagnosis not present

## 2020-02-15 DIAGNOSIS — I482 Chronic atrial fibrillation, unspecified: Secondary | ICD-10-CM | POA: Diagnosis not present

## 2020-02-15 DIAGNOSIS — I129 Hypertensive chronic kidney disease with stage 1 through stage 4 chronic kidney disease, or unspecified chronic kidney disease: Secondary | ICD-10-CM | POA: Diagnosis not present

## 2020-02-18 DIAGNOSIS — I252 Old myocardial infarction: Secondary | ICD-10-CM | POA: Diagnosis not present

## 2020-02-18 DIAGNOSIS — J449 Chronic obstructive pulmonary disease, unspecified: Secondary | ICD-10-CM | POA: Diagnosis not present

## 2020-02-18 DIAGNOSIS — N183 Chronic kidney disease, stage 3 unspecified: Secondary | ICD-10-CM | POA: Diagnosis not present

## 2020-02-18 DIAGNOSIS — J9611 Chronic respiratory failure with hypoxia: Secondary | ICD-10-CM | POA: Diagnosis not present

## 2020-02-18 DIAGNOSIS — I080 Rheumatic disorders of both mitral and aortic valves: Secondary | ICD-10-CM | POA: Diagnosis not present

## 2020-02-18 DIAGNOSIS — E1122 Type 2 diabetes mellitus with diabetic chronic kidney disease: Secondary | ICD-10-CM | POA: Diagnosis not present

## 2020-02-18 DIAGNOSIS — I129 Hypertensive chronic kidney disease with stage 1 through stage 4 chronic kidney disease, or unspecified chronic kidney disease: Secondary | ICD-10-CM | POA: Diagnosis not present

## 2020-02-18 DIAGNOSIS — I482 Chronic atrial fibrillation, unspecified: Secondary | ICD-10-CM | POA: Diagnosis not present

## 2020-02-18 DIAGNOSIS — F0390 Unspecified dementia without behavioral disturbance: Secondary | ICD-10-CM | POA: Diagnosis not present

## 2020-02-21 DIAGNOSIS — N3941 Urge incontinence: Secondary | ICD-10-CM | POA: Diagnosis not present

## 2020-02-21 DIAGNOSIS — E119 Type 2 diabetes mellitus without complications: Secondary | ICD-10-CM | POA: Diagnosis not present

## 2020-02-22 DIAGNOSIS — I482 Chronic atrial fibrillation, unspecified: Secondary | ICD-10-CM | POA: Diagnosis not present

## 2020-02-22 DIAGNOSIS — I252 Old myocardial infarction: Secondary | ICD-10-CM | POA: Diagnosis not present

## 2020-02-22 DIAGNOSIS — N183 Chronic kidney disease, stage 3 unspecified: Secondary | ICD-10-CM | POA: Diagnosis not present

## 2020-02-22 DIAGNOSIS — J449 Chronic obstructive pulmonary disease, unspecified: Secondary | ICD-10-CM | POA: Diagnosis not present

## 2020-02-22 DIAGNOSIS — J9611 Chronic respiratory failure with hypoxia: Secondary | ICD-10-CM | POA: Diagnosis not present

## 2020-02-22 DIAGNOSIS — I129 Hypertensive chronic kidney disease with stage 1 through stage 4 chronic kidney disease, or unspecified chronic kidney disease: Secondary | ICD-10-CM | POA: Diagnosis not present

## 2020-02-22 DIAGNOSIS — F0390 Unspecified dementia without behavioral disturbance: Secondary | ICD-10-CM | POA: Diagnosis not present

## 2020-02-22 DIAGNOSIS — E1122 Type 2 diabetes mellitus with diabetic chronic kidney disease: Secondary | ICD-10-CM | POA: Diagnosis not present

## 2020-02-22 DIAGNOSIS — I080 Rheumatic disorders of both mitral and aortic valves: Secondary | ICD-10-CM | POA: Diagnosis not present

## 2020-02-23 ENCOUNTER — Other Ambulatory Visit: Payer: Self-pay | Admitting: *Deleted

## 2020-02-23 DIAGNOSIS — J449 Chronic obstructive pulmonary disease, unspecified: Secondary | ICD-10-CM

## 2020-02-23 DIAGNOSIS — J4489 Other specified chronic obstructive pulmonary disease: Secondary | ICD-10-CM

## 2020-02-23 MED ORDER — TRELEGY ELLIPTA 100-62.5-25 MCG/INH IN AEPB: 1.0000 | INHALATION_SPRAY | Freq: Every day | RESPIRATORY_TRACT | 11 refills | Status: AC

## 2020-02-23 MED ORDER — ALENDRONATE SODIUM 70 MG PO TABS: ORAL_TABLET | ORAL | 12 refills | Status: AC

## 2020-02-23 MED ORDER — DONEPEZIL HCL 5 MG PO TABS: 5.0000 mg | ORAL_TABLET | Freq: Every day | ORAL | 1 refills | Status: AC

## 2020-02-23 MED ORDER — SIMVASTATIN 20 MG PO TABS: ORAL_TABLET | ORAL | 3 refills | Status: AC

## 2020-02-23 MED ORDER — APIXABAN 2.5 MG PO TABS: 2.5000 mg | ORAL_TABLET | Freq: Two times a day (BID) | ORAL | 6 refills | Status: AC

## 2020-02-24 DIAGNOSIS — I482 Chronic atrial fibrillation, unspecified: Secondary | ICD-10-CM | POA: Diagnosis not present

## 2020-02-24 DIAGNOSIS — R0902 Hypoxemia: Secondary | ICD-10-CM | POA: Diagnosis not present

## 2020-02-24 DIAGNOSIS — E119 Type 2 diabetes mellitus without complications: Secondary | ICD-10-CM | POA: Diagnosis not present

## 2020-02-24 DIAGNOSIS — I252 Old myocardial infarction: Secondary | ICD-10-CM | POA: Diagnosis not present

## 2020-02-24 DIAGNOSIS — Z87891 Personal history of nicotine dependence: Secondary | ICD-10-CM | POA: Diagnosis not present

## 2020-02-24 DIAGNOSIS — J984 Other disorders of lung: Secondary | ICD-10-CM | POA: Diagnosis not present

## 2020-02-24 DIAGNOSIS — S52002K Unspecified fracture of upper end of left ulna, subsequent encounter for closed fracture with nonunion: Secondary | ICD-10-CM | POA: Diagnosis not present

## 2020-02-24 DIAGNOSIS — J9611 Chronic respiratory failure with hypoxia: Secondary | ICD-10-CM | POA: Diagnosis not present

## 2020-02-24 DIAGNOSIS — N390 Urinary tract infection, site not specified: Secondary | ICD-10-CM | POA: Diagnosis not present

## 2020-02-24 DIAGNOSIS — I080 Rheumatic disorders of both mitral and aortic valves: Secondary | ICD-10-CM | POA: Diagnosis not present

## 2020-02-24 DIAGNOSIS — R52 Pain, unspecified: Secondary | ICD-10-CM | POA: Diagnosis not present

## 2020-02-24 DIAGNOSIS — I129 Hypertensive chronic kidney disease with stage 1 through stage 4 chronic kidney disease, or unspecified chronic kidney disease: Secondary | ICD-10-CM | POA: Diagnosis not present

## 2020-02-24 DIAGNOSIS — F0390 Unspecified dementia without behavioral disturbance: Secondary | ICD-10-CM | POA: Diagnosis not present

## 2020-02-24 DIAGNOSIS — E079 Disorder of thyroid, unspecified: Secondary | ICD-10-CM | POA: Diagnosis not present

## 2020-02-24 DIAGNOSIS — D649 Anemia, unspecified: Secondary | ICD-10-CM | POA: Diagnosis not present

## 2020-02-24 DIAGNOSIS — R531 Weakness: Secondary | ICD-10-CM | POA: Diagnosis not present

## 2020-02-24 DIAGNOSIS — J449 Chronic obstructive pulmonary disease, unspecified: Secondary | ICD-10-CM | POA: Diagnosis not present

## 2020-02-24 DIAGNOSIS — E1122 Type 2 diabetes mellitus with diabetic chronic kidney disease: Secondary | ICD-10-CM | POA: Diagnosis not present

## 2020-02-24 DIAGNOSIS — W19XXXA Unspecified fall, initial encounter: Secondary | ICD-10-CM | POA: Diagnosis not present

## 2020-02-24 DIAGNOSIS — W06XXXA Fall from bed, initial encounter: Secondary | ICD-10-CM | POA: Diagnosis not present

## 2020-02-24 DIAGNOSIS — R918 Other nonspecific abnormal finding of lung field: Secondary | ICD-10-CM | POA: Diagnosis not present

## 2020-02-24 DIAGNOSIS — Z043 Encounter for examination and observation following other accident: Secondary | ICD-10-CM | POA: Diagnosis not present

## 2020-02-24 DIAGNOSIS — M25422 Effusion, left elbow: Secondary | ICD-10-CM | POA: Diagnosis not present

## 2020-02-24 DIAGNOSIS — S42492A Other displaced fracture of lower end of left humerus, initial encounter for closed fracture: Secondary | ICD-10-CM | POA: Diagnosis not present

## 2020-02-24 DIAGNOSIS — N183 Chronic kidney disease, stage 3 unspecified: Secondary | ICD-10-CM | POA: Diagnosis not present

## 2020-02-25 DIAGNOSIS — I080 Rheumatic disorders of both mitral and aortic valves: Secondary | ICD-10-CM | POA: Diagnosis not present

## 2020-02-25 DIAGNOSIS — J9611 Chronic respiratory failure with hypoxia: Secondary | ICD-10-CM | POA: Diagnosis not present

## 2020-02-25 DIAGNOSIS — J449 Chronic obstructive pulmonary disease, unspecified: Secondary | ICD-10-CM | POA: Diagnosis not present

## 2020-02-25 DIAGNOSIS — I482 Chronic atrial fibrillation, unspecified: Secondary | ICD-10-CM | POA: Diagnosis not present

## 2020-02-25 DIAGNOSIS — I252 Old myocardial infarction: Secondary | ICD-10-CM | POA: Diagnosis not present

## 2020-02-25 DIAGNOSIS — E1122 Type 2 diabetes mellitus with diabetic chronic kidney disease: Secondary | ICD-10-CM | POA: Diagnosis not present

## 2020-02-25 DIAGNOSIS — N183 Chronic kidney disease, stage 3 unspecified: Secondary | ICD-10-CM | POA: Diagnosis not present

## 2020-02-25 DIAGNOSIS — F0390 Unspecified dementia without behavioral disturbance: Secondary | ICD-10-CM | POA: Diagnosis not present

## 2020-02-25 DIAGNOSIS — I129 Hypertensive chronic kidney disease with stage 1 through stage 4 chronic kidney disease, or unspecified chronic kidney disease: Secondary | ICD-10-CM | POA: Diagnosis not present

## 2020-02-28 DIAGNOSIS — J449 Chronic obstructive pulmonary disease, unspecified: Secondary | ICD-10-CM | POA: Diagnosis not present

## 2020-03-03 DIAGNOSIS — Z993 Dependence on wheelchair: Secondary | ICD-10-CM | POA: Diagnosis not present

## 2020-03-03 DIAGNOSIS — J438 Other emphysema: Secondary | ICD-10-CM | POA: Diagnosis not present

## 2020-03-06 ENCOUNTER — Other Ambulatory Visit: Payer: Self-pay

## 2020-03-06 ENCOUNTER — Ambulatory Visit (INDEPENDENT_AMBULATORY_CARE_PROVIDER_SITE_OTHER): Payer: Medicare HMO | Admitting: Family Medicine

## 2020-03-06 ENCOUNTER — Encounter: Payer: Self-pay | Admitting: Family Medicine

## 2020-03-06 VITALS — BP 116/58 | HR 103 | Temp 98.8°F | Ht 64.0 in | Wt 120.0 lb

## 2020-03-06 DIAGNOSIS — W19XXXA Unspecified fall, initial encounter: Secondary | ICD-10-CM | POA: Diagnosis not present

## 2020-03-06 DIAGNOSIS — N3000 Acute cystitis without hematuria: Secondary | ICD-10-CM

## 2020-03-06 DIAGNOSIS — Z794 Long term (current) use of insulin: Secondary | ICD-10-CM

## 2020-03-06 DIAGNOSIS — E118 Type 2 diabetes mellitus with unspecified complications: Secondary | ICD-10-CM

## 2020-03-06 DIAGNOSIS — L02213 Cutaneous abscess of chest wall: Secondary | ICD-10-CM

## 2020-03-06 MED ORDER — LANTUS SOLOSTAR 100 UNIT/ML ~~LOC~~ SOPN: PEN_INJECTOR | SUBCUTANEOUS | 3 refills | Status: AC

## 2020-03-06 MED ORDER — DOXYCYCLINE HYCLATE 100 MG PO TABS: 100.0000 mg | ORAL_TABLET | Freq: Two times a day (BID) | ORAL | 0 refills | Status: AC

## 2020-03-06 NOTE — Patient Instructions (Signed)
After 24 hours please remove the gauze and bandage.  Pull a little bit of the string out.  Approximately 1 inch.  Then put more gauze back over the wound so that if it drains it we will collect the discharge.  Please pick up and start the new antibiotic.  Each day please pull/remove 1 inch of the stream.  You can trim it so there is a short tail that you can grasp the next day.  If all of the gauze comes out all at 1 time then it is perfectly fine just continue to cover with some gauze to absorb any discharge.

## 2020-03-06 NOTE — Assessment & Plan Note (Addendum)
Reviewed log - Sugars between 80-201 in AM and 105-220 PM. At goal. Giving 10 units of Lantus at bedtime. Ok to give 12 units if glucose > 200.

## 2020-03-06 NOTE — Progress Notes (Signed)
Established Patient Office Visit  Subjective:  Patient ID: Theresa Huang, female    DOB: [birth year]  Age: 86 y.o. MRN: [id number]  CC:  Chief Complaint  Patient presents with  . Follow-up    HPI Theresa Huang presents for follow-up of emergency department visit on June 10.  She was found on the floor near her bed trying to get back into bed.  She had evidently injured her left elbow.  She did have a head CT which was negative.  Urinalysis was positive for protein, blood, leukocytes and elevated white blood cells and rare bacteria.  He was started on Keflex for possible UTI.  He also had a CT cervical spine which showed no acute fracture or hematoma.  Chest x-ray showed basilar atelectasis.  X-ray of the left elbow showed mild to moderately displaced supracondylar fracture.  As well as diffuse soft tissue swelling of the elbow.  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  Patient was placed in arm sling in the ED for comfort.  She was also noted to be mildly anemic with a hemoglobin of 10.8 on her labs in the emergency department.  And we did labs on her 3 months ago her hemoglobin was 12.2.  Past Medical History:  Diagnosis Date  . Anemia   . Cancer ([place])    SCC  . Carotid artery occlusion   . COPD (chronic obstructive pulmonary disease) (HCC)    wears oxygen at 3L prn daytime and 3L at night   . DDD (degenerative disc disease)   . Diabetes mellitus (HCC)    fasting blood sugar 80-100  . Diastolic dysfunction   . Dizziness   . Glaucoma   . Headache(784.0)    migranes  . Hearing loss of both ears    wears bilateral hearing aids  . Hemorrhoids   . Holter monitor, abnormal    wearing for 21 days  . Hyperlipidemia   . Hypothyroid   . Inflammatory polyps of colon with rectal bleeding ([place])   . Irregular heart beat   . Myocardial infarction ([place]) 2006  . Pneumonia    hx  . Retinopathy of left eye   . Shortness of breath     most of time  . Syncope     Past Surgical History:  Procedure Laterality Date  . ABDOMINAL HYSTERECTOMY    . CAROTID ENDARTERECTOMY    . COLONOSCOPY W/ POLYPECTOMY    . ENDARTERECTOMY  09/01/2012   Procedure: ENDARTERECTOMY CAROTID;  Surgeon: Conrad Exeland, MD;  Location: [facility];  Service: Vascular;  Laterality: Right;  . EYE SURGERY     cataracts  . HEMORRHOID SURGERY    . PATCH ANGIOPLASTY  09/01/2012   Procedure: PATCH ANGIOPLASTY;  Surgeon: Conrad [place], MD;  Location: [facility];  Service: Vascular;  Laterality: Right;  Vascu-Guard Patch Angioplasty  . squamous cell removed    . THROAT SURGERY    . TONSILLECTOMY      Family History  Problem Relation Age of Onset  . Depression Mother   . Diabetes Mother   . CAD Mother   . Heart disease Mother   . Hyperlipidemia Daughter   . CAD Father   . Heart disease Father   . Diabetes Brother   . Hypertension Son   . Breast cancer Daughter     Social History   Socioeconomic History  . Marital status: Widowed    Spouse  name: Not on file  . Number of children: 4  . Years of education: 44  . Highest education level: 12th grade  Occupational History    Employer: RETIRED    Comment: nursing  Tobacco Use  . Smoking status: Former Smoker    Types: Cigarettes    Quit date: 09/17/1987    Years since quitting: 32.4  . Smokeless tobacco: Never Used  Vaping Use  . Vaping Use: Never used  Substance and Sexual Activity  . Alcohol use: No  . Drug use: No  . Sexual activity: Not Currently  Other Topics Concern  . Not on file  Social History Narrative   Lives with daughter, doesn't go outside much because of weeds and snakes.Was diagnosed with dementia stays confused a lot per her daughter who done visit for her   Social Determinants of Health   Financial Resource Strain:   . Difficulty of Paying Living Expenses:   Food Insecurity:   . Worried About Charity fundraiser in the Last Year:   . Arboriculturist in the Last Year:    Transportation Needs:   . Film/video editor (Medical):   [name] Lack of Transportation (Non-Medical):   Physical Activity:   . Days of Exercise per Week:   . Minutes of Exercise per Session:   Stress: No Stress Concern Present  . Feeling of Stress : Not at all  Social Connections: Unknown  . Frequency of Communication with Friends and Family: Once a week  . Frequency of Social Gatherings with Friends and Family: Not on file  . Attends Religious Services: Not on file  . Active Member of Clubs or Organizations: Not on file  . Attends Archivist Meetings: Not on file  . Marital Status: Not on file  Intimate Partner Violence:   . Fear of Current or Ex-Partner:   . Emotionally Abused:   [name] Physically Abused:   . Sexually Abused:     Outpatient Medications Prior to Visit  Medication Sig Dispense Refill  . acetaminophen (TYLENOL ARTHRITIS PAIN) 650 MG CR tablet Take 650 mg by mouth every 8 (eight) hours as needed. For pain    . albuterol (ACCUNEB) 1.25 MG/3ML nebulizer solution Take 3 mLs (1.25 mg total) by nebulization every 6 (six) hours as needed for wheezing. Please fax to [facility]. 75 mL 12  . albuterol (VENTOLIN HFA) 108 (90 Base) MCG/ACT inhaler INHALE 2 PUFFS INTO THE LUNGS EVERY 4 HOURS AS NEEDED FOR WHEEZING 18 g 3  . alendronate (FOSAMAX) 70 MG tablet TAKE ONE TABLET ONCE A WEEK 4 tablet 12  . ALLERGY RELIEF 10 MG tablet TAKE 1 TABLET BY MOUTH ONCE DAILY 30 tablet 11  . amantadine (SYMMETREL) 50 MG/5ML solution TAKE 5ML BY MOUTH DAILY    . AMBULATORY NON FORMULARY MEDICATION Medication Name: glucometer strips to test BID.  Dx. 250.00 100 Units PRN  . AMBULATORY NON FORMULARY MEDICATION Medication Name: Portable oxygen - 2 liters. Dx. COPD.  Send to [facility]. (Patient taking differently: Medication Name: Portable oxygen - 3.5 liters. Dx. COPD.  Send to [facility].) 1 vial 0  . AMBULATORY NON FORMULARY MEDICATION Medication Name:  lightweight wheelchair with gel seat cushion,  anti-tippers, brake lock extensions and elevated leg rests. DX: J44.1, Z99.3 1 each 0  . amLODipine (NORVASC) 2.5 MG tablet Take 2.5 mg by mouth daily.    [name] apixaban (ELIQUIS) 2.5 MG TABS tablet Take 1 tablet (2.5 mg total) by mouth 2 (two) times daily.  60 tablet 6  . aspirin 81 MG tablet Take 81 mg by mouth daily.      . Calcium-Vitamin D (CALTRATE 600 PLUS-VIT D PO) Take 1 tablet by mouth daily.     . cholecalciferol (VITAMIN D) 1000 units tablet Take 1,000 Units by mouth daily.    [name] docusate sodium (COLACE) 100 MG capsule Take 1 capsule (100 mg total) by mouth 2 (two) times daily. 60 capsule 5  . donepezil (ARICEPT) 5 MG tablet Take 1 tablet (5 mg total) by mouth at bedtime. 90 tablet 1  . Fluticasone-Umeclidin-Vilant (TRELEGY ELLIPTA) 100-62.5-25 MCG/INH AEPB Inhale 1 puff into the lungs daily. 60 each 11  . Lancets (UNILET COMFORTOUCH LANCET) MISC TEST TWICE DAILY 100 each 2  . megestrol (MEGACE ES) 625 MG/5ML suspension Take 5 mLs (625 mg total) by mouth daily. 150 mL 2  . Multiple Vitamin (MULTIVITAMIN) tablet Take 1 tablet by mouth daily.      . nitroGLYCERIN (NITROSTAT) 0.4 MG SL tablet Place 1 tablet (0.4 mg total) under the tongue every 5 (five) minutes as needed. For chest pain (Patient not taking: Reported on 02/04/2020) 30 tablet 0  . Ostomy Supplies (ALLKARE PROTECT BARRIER WIPES) MISC 1 application by Does not apply route 3 (three) times daily as needed. 100 each 1  . protective barrier (RESTORE) CREA Apply 1 application topically 2 (two) times daily. 120 g 1  . sertraline (ZOLOFT) 50 MG tablet Take 1 tablet (50 mg total) by mouth daily. TAKE 1/2 TABLET BY MOUTH EVERY DAY FOR 10 DAYS, THEN INCREASE TO A WHOLE TABLET DAILY 90 tablet 0  . simvastatin (ZOCOR) 20 MG tablet TAKE ONE TABLET BY MOUTH DAILY AT 6PM 90 tablet 3  . SYNTHROID 88 MCG tablet Take 1 tablet (88 mcg total) by mouth daily before breakfast. 30 tablet 3  . TRUETRACK TEST test strip     . UNIFINE PENTIPS 29G X 12MM MISC  USE ONCE DAILY AS DIRECTED 100 each 11  . Insulin Glargine (LANTUS SOLOSTAR) 100 UNIT/ML Solostar Pen Inject 12 Units into the skin at bedtime. 15 mL 0   No facility-administered medications prior to visit.    Allergies  Allergen Reactions  . Bee Venom Anaphylaxis  . Other Anaphylaxis  . Tetracyclines & Related Anaphylaxis  . Tetracycline     REACTION: arms swelling, itching, blisters  . Tramadol Other (See Comments)    Excess sedation     ROS Review of Systems    Objective:    Physical Exam Vitals reviewed.  Constitutional:      Appearance: She is well-developed.  HENT:     Head: Normocephalic and atraumatic.  Eyes:     Conjunctiva/sclera: Conjunctivae normal.  Cardiovascular:     Rate and Rhythm: Normal rate.  Pulmonary:     Effort: Pulmonary effort is normal.  Skin:    General: Skin is dry.     Coloration: Skin is not pale.     Comments: She has an approx golf ball size abscess on the mid chest wall with some serous drainage and peeling skin on the surface.   Neurological:     Mental Status: She is alert and oriented to person, place, and time.  Psychiatric:        Behavior: Behavior normal.     BP (!) 116/58   Pulse (!) 103   Temp 98.8 F (37.1 C)   Ht 5\' 4"  (1.626 m)   Wt 120 lb (54.4 kg)   SpO2 99% Comment:  4L  BMI 20.60 kg/m  Wt Readings from Last 3 Encounters:  03/06/20 120 lb (54.4 kg)  02/04/20 123 lb (55.8 kg)  12/15/19 118 lb (53.5 kg)     Health Maintenance Due  Topic Date Due  . COVID-19 Vaccine (1) Never done  . URINE MICROALBUMIN  12/10/2017  . OPHTHALMOLOGY EXAM  03/06/2019  . FOOT EXAM  03/11/2019    There are no preventive care reminders to display for this patient.  Lab Results  Component Value Date   TSH 1.98 12/15/2019   Lab Results  Component Value Date   WBC 5.9 12/15/2019   HGB 12.2 12/15/2019   HCT 37.6 12/15/2019   MCV 94.0 12/15/2019   PLT 225 12/15/2019   Lab Results  Component Value Date   NA 141  12/15/2019   K 4.8 12/15/2019   CO2 31 12/15/2019   GLUCOSE 227 (H) 12/15/2019   BUN 24 12/15/2019   CREATININE 1.20 (H) 12/15/2019   BILITOT 0.3 12/15/2019   ALKPHOS 60 07/22/2016   AST 16 12/15/2019   ALT 14 12/15/2019   PROT 7.2 12/15/2019   ALBUMIN 3.8 07/22/2016   CALCIUM 10.0 12/15/2019   Lab Results  Component Value Date   CHOL 169 06/10/2018   Lab Results  Component Value Date   HDL 61 06/10/2018   Lab Results  Component Value Date   LDLCALC 89 06/10/2018   Lab Results  Component Value Date   TRIG 91 06/10/2018   Lab Results  Component Value Date   CHOLHDL 2.8 06/10/2018   Lab Results  Component Value Date   HGBA1C 5.9 (A) 12/15/2019      Assessment & Plan:   Problem List Items Addressed This Visit      Endocrine   Type 2 diabetes mellitus with complication, with long-term current use of insulin ([place])    Reviewed log - Sugars between 80-201 in AM and 105-220 PM. At goal. Giving 10 units of Lantus at bedtime. Ok to give 12 units if glucose > 200.       Relevant Medications   insulin glargine (LANTUS SOLOSTAR) 100 UNIT/ML Solostar Pen    Other Visit Diagnoses    Acute cystitis without hematuria    -  Primary   Fall, initial encounter       Cutaneous abscess of chest wall       Relevant Medications   doxycycline (VIBRA-TABS) 100 MG tablet   Other Relevant Orders   Wound culture     Acute cystitis - never had symptoms. Still asymptomatic. Camila Li has one more day of antibiotics.   Abscess Chest Wall - consistent with infected seb cyst.  I & D performed. Pt tolerated well.  After 24 hours please remove the gauze and bandage.  Pull a little bit of the string out.  Approximately 1 inch.  Then put more gauze back over the wound so that if it drains it we will collect the discharge.  Please pick up and start the new antibiotic.  Each day please pull/remove 1 inch of the stream.  You can trim it so there is a short tail that you can grasp the next day.  If  all of the gauze comes out all at 1 time then it is perfectly fine just continue to cover with some gauze to absorb any discharge.Will start doxy.   Plain to recheck hemoglobin at next visit.   Incision and Drainage Procedure Note  Pre-operative Diagnosis: abscess mid chest wall  Post-operative  Diagnosis: infected seb cyst  Indications: Pain and drainage   Anesthesia: 1% lidocaine with epinephrine  Procedure Details  The procedure, risks and complications have been discussed in detail (including, but not limited to airway compromise, infection, bleeding) with the patient, and the patient has signed consent to the procedure.  The skin was sterilely prepped and draped over the affected area in the usual fashion. After adequate local anesthesia, I&D with a #11 blade was performed on the lower, end of the abscess. Purulent drainage: present. Wound probed with a sterile applicator. Culture sent. The patient was observed until stable.  Findings: Await culture results. + infected seb cyst.   EBL: trace  Drains: iodoform gauze placed into wound approx 9 inches long.   Condition: Tolerated procedure well   Complications: none.    Meds ordered this encounter  Medications  . doxycycline (VIBRA-TABS) 100 MG tablet    Sig: Take 1 tablet (100 mg total) by mouth 2 (two) times daily.    Dispense:  20 tablet    Refill:  0  . insulin glargine (LANTUS SOLOSTAR) 100 UNIT/ML Solostar Pen    Sig: Inject 10-12 Units into the skin at bedtime.    Dispense:  15 mL    Refill:  3    OFFICE VISIT REQUIRED PRIOR TO ANY FURTHER REFILLS    Follow-up: Return in about 10 days (around 03/16/2020) for Okay to schedule with Dr. Dianah Field.  In 1 to 2 weeks.Beatrice Lecher, MD

## 2020-03-08 ENCOUNTER — Other Ambulatory Visit: Payer: Self-pay | Admitting: Family Medicine

## 2020-03-08 DIAGNOSIS — L02213 Cutaneous abscess of chest wall: Secondary | ICD-10-CM

## 2020-03-08 MED ORDER — FLUCONAZOLE 150 MG PO TABS: 150.0000 mg | ORAL_TABLET | ORAL | 0 refills | Status: AC

## 2020-03-08 NOTE — Addendum Note (Signed)
Addended by: Beatrice Lecher D on: 03/08/2020 12:33 PM   Modules accepted: Orders

## 2020-03-09 LAB — WOUND CULTURE
GRAM STAIN:: NONE SEEN
MICRO NUMBER:: 10615379
SPECIMEN QUALITY:: ADEQUATE

## 2020-03-15 DIAGNOSIS — J449 Chronic obstructive pulmonary disease, unspecified: Secondary | ICD-10-CM | POA: Diagnosis not present

## 2020-03-15 DIAGNOSIS — J9611 Chronic respiratory failure with hypoxia: Secondary | ICD-10-CM | POA: Diagnosis not present

## 2020-03-16 ENCOUNTER — Other Ambulatory Visit: Payer: Self-pay

## 2020-03-16 ENCOUNTER — Ambulatory Visit (INDEPENDENT_AMBULATORY_CARE_PROVIDER_SITE_OTHER): Payer: Medicare HMO | Admitting: Sports Medicine

## 2020-03-16 DIAGNOSIS — L02213 Cutaneous abscess of chest wall: Secondary | ICD-10-CM | POA: Insufficient documentation

## 2020-03-16 MED ORDER — HYDROCODONE-ACETAMINOPHEN 5-325 MG PO TABS: 1.0000 | ORAL_TABLET | Freq: Three times a day (TID) | ORAL | 0 refills | Status: DC | PRN

## 2020-03-16 MED ORDER — MIRTAZAPINE 15 MG PO TABS: 15.0000 mg | ORAL_TABLET | Freq: Every day | ORAL | 0 refills | Status: AC

## 2020-03-16 MED ORDER — SULFAMETHOXAZOLE-TRIMETHOPRIM 800-160 MG PO TABS: 1.0000 | ORAL_TABLET | Freq: Two times a day (BID) | ORAL | 0 refills | Status: AC

## 2020-03-16 MED ORDER — METOPROLOL TARTRATE 25 MG PO TABS: 25.0000 mg | ORAL_TABLET | Freq: Two times a day (BID) | ORAL | 0 refills | Status: AC

## 2020-03-16 NOTE — Assessment & Plan Note (Signed)
Theresa Huang is a pleasant 86 year old female, she has what sounds to be infected sebaceous cyst on her right anterior chest wall, she had an I&D with her PCP about 10 days ago, doxycycline was prescribed and it sounds like wound cultures grew out yeast. Considering the purulence of the wound I do suspect this is likely a contaminant, and most likely we are dealing with a standard staphylococcal infection. She still has significant drainage, and pain. Adding hydrocodone for pain relief, Septra, I am going to have home health nursing come out to help with wound care and dressing changes, as the family is not willing. Return to see me or Dr. Madilyn Fireman in a week.

## 2020-03-16 NOTE — Progress Notes (Signed)
    Procedures performed today:    None.  Independent interpretation of notes and tests performed by another provider:   None.  Brief History, Exam, Impression, and Recommendations:    Abscess of chest wall Theresa Huang is a pleasant 86 year old female, she has what sounds to be infected sebaceous cyst on her right anterior chest wall, she had an I&D with her PCP about 10 days ago, doxycycline was prescribed and it sounds like wound cultures grew out yeast. Considering the purulence of the wound I do suspect this is likely a contaminant, and most likely we are dealing with a standard staphylococcal infection. She still has significant drainage, and pain. Adding hydrocodone for pain relief, Septra, I am going to have home health nursing come out to help with wound care and dressing changes, as the family is not willing. Return to see me or Dr. Madilyn Fireman in a week.    ___________________________________________ Gwen Her. Dianah Field, M.D., ABFM., CAQSM. Primary Care and [place] Instructor of [place] of [facility] of Medicine

## 2020-03-20 DIAGNOSIS — E113299 Type 2 diabetes mellitus with mild nonproliferative diabetic retinopathy without macular edema, unspecified eye: Secondary | ICD-10-CM | POA: Diagnosis not present

## 2020-03-20 DIAGNOSIS — J9611 Chronic respiratory failure with hypoxia: Secondary | ICD-10-CM | POA: Diagnosis not present

## 2020-03-20 DIAGNOSIS — N183 Chronic kidney disease, stage 3 unspecified: Secondary | ICD-10-CM | POA: Diagnosis not present

## 2020-03-20 DIAGNOSIS — L02213 Cutaneous abscess of chest wall: Secondary | ICD-10-CM | POA: Diagnosis not present

## 2020-03-20 DIAGNOSIS — I4819 Other persistent atrial fibrillation: Secondary | ICD-10-CM | POA: Diagnosis not present

## 2020-03-20 DIAGNOSIS — J449 Chronic obstructive pulmonary disease, unspecified: Secondary | ICD-10-CM | POA: Diagnosis not present

## 2020-03-20 DIAGNOSIS — I129 Hypertensive chronic kidney disease with stage 1 through stage 4 chronic kidney disease, or unspecified chronic kidney disease: Secondary | ICD-10-CM | POA: Diagnosis not present

## 2020-03-20 DIAGNOSIS — J9612 Chronic respiratory failure with hypercapnia: Secondary | ICD-10-CM | POA: Diagnosis not present

## 2020-03-20 DIAGNOSIS — E1122 Type 2 diabetes mellitus with diabetic chronic kidney disease: Secondary | ICD-10-CM | POA: Diagnosis not present

## 2020-03-23 DIAGNOSIS — E119 Type 2 diabetes mellitus without complications: Secondary | ICD-10-CM | POA: Diagnosis not present

## 2020-03-23 DIAGNOSIS — N3941 Urge incontinence: Secondary | ICD-10-CM | POA: Diagnosis not present

## 2020-03-24 ENCOUNTER — Ambulatory Visit (INDEPENDENT_AMBULATORY_CARE_PROVIDER_SITE_OTHER): Payer: Medicare HMO | Admitting: Sports Medicine

## 2020-03-24 DIAGNOSIS — L02213 Cutaneous abscess of chest wall: Secondary | ICD-10-CM | POA: Diagnosis not present

## 2020-03-24 NOTE — Assessment & Plan Note (Addendum)
Theresa Huang returns, she is an 86 year old female, she had what sounded like an infected sebaceous cyst on the right anterior chest wall, she had an I&D with her PCP approximately 19 days ago, doxycycline was prescribed and the wound grew out yeast, we added Septra, and she returns today with the wound completely closed, she has another smaller skin tear on her left humerus. Overall I think this will heal in some with simple dressing changes daily. Return to see Dr. Madilyn Fireman as needed.

## 2020-03-24 NOTE — Progress Notes (Signed)
    Procedures performed today:    None.  Independent interpretation of notes and tests performed by another provider:   None.  Brief History, Exam, Impression, and Recommendations:    Abscess of chest wall Theresa Huang returns, she is an 86 year old female, she had what sounded like an infected sebaceous cyst on the right anterior chest wall, she had an I&D with Huang PCP approximately 19 days ago, doxycycline was prescribed and the wound grew out yeast, we added Septra, and she returns today with the wound completely closed, she has another smaller skin tear on Huang left humerus. Overall I think this will heal in some with simple dressing changes daily. Return to see Dr. Madilyn Fireman as needed.    ___________________________________________ Theresa Huang. Dianah Field, M.D., ABFM., CAQSM. Primary Care and [place] Instructor of [place] of [facility] of Medicine

## 2020-03-25 DIAGNOSIS — I129 Hypertensive chronic kidney disease with stage 1 through stage 4 chronic kidney disease, or unspecified chronic kidney disease: Secondary | ICD-10-CM | POA: Diagnosis not present

## 2020-03-25 DIAGNOSIS — L02213 Cutaneous abscess of chest wall: Secondary | ICD-10-CM | POA: Diagnosis not present

## 2020-03-25 DIAGNOSIS — N183 Chronic kidney disease, stage 3 unspecified: Secondary | ICD-10-CM | POA: Diagnosis not present

## 2020-03-25 DIAGNOSIS — E1122 Type 2 diabetes mellitus with diabetic chronic kidney disease: Secondary | ICD-10-CM | POA: Diagnosis not present

## 2020-03-25 DIAGNOSIS — J449 Chronic obstructive pulmonary disease, unspecified: Secondary | ICD-10-CM | POA: Diagnosis not present

## 2020-03-25 DIAGNOSIS — J9612 Chronic respiratory failure with hypercapnia: Secondary | ICD-10-CM | POA: Diagnosis not present

## 2020-03-25 DIAGNOSIS — I4819 Other persistent atrial fibrillation: Secondary | ICD-10-CM | POA: Diagnosis not present

## 2020-03-25 DIAGNOSIS — J9611 Chronic respiratory failure with hypoxia: Secondary | ICD-10-CM | POA: Diagnosis not present

## 2020-03-25 DIAGNOSIS — E113299 Type 2 diabetes mellitus with mild nonproliferative diabetic retinopathy without macular edema, unspecified eye: Secondary | ICD-10-CM | POA: Diagnosis not present

## 2020-03-28 DIAGNOSIS — E113299 Type 2 diabetes mellitus with mild nonproliferative diabetic retinopathy without macular edema, unspecified eye: Secondary | ICD-10-CM | POA: Diagnosis not present

## 2020-03-28 DIAGNOSIS — J449 Chronic obstructive pulmonary disease, unspecified: Secondary | ICD-10-CM | POA: Diagnosis not present

## 2020-03-28 DIAGNOSIS — J9612 Chronic respiratory failure with hypercapnia: Secondary | ICD-10-CM | POA: Diagnosis not present

## 2020-03-28 DIAGNOSIS — I129 Hypertensive chronic kidney disease with stage 1 through stage 4 chronic kidney disease, or unspecified chronic kidney disease: Secondary | ICD-10-CM | POA: Diagnosis not present

## 2020-03-28 DIAGNOSIS — J9611 Chronic respiratory failure with hypoxia: Secondary | ICD-10-CM | POA: Diagnosis not present

## 2020-03-28 DIAGNOSIS — I4819 Other persistent atrial fibrillation: Secondary | ICD-10-CM | POA: Diagnosis not present

## 2020-03-28 DIAGNOSIS — L02213 Cutaneous abscess of chest wall: Secondary | ICD-10-CM | POA: Diagnosis not present

## 2020-03-28 DIAGNOSIS — N183 Chronic kidney disease, stage 3 unspecified: Secondary | ICD-10-CM | POA: Diagnosis not present

## 2020-03-28 DIAGNOSIS — E1122 Type 2 diabetes mellitus with diabetic chronic kidney disease: Secondary | ICD-10-CM | POA: Diagnosis not present

## 2020-03-29 ENCOUNTER — Other Ambulatory Visit: Payer: Self-pay | Admitting: Sports Medicine

## 2020-03-29 DIAGNOSIS — L02213 Cutaneous abscess of chest wall: Secondary | ICD-10-CM

## 2020-03-29 DIAGNOSIS — J449 Chronic obstructive pulmonary disease, unspecified: Secondary | ICD-10-CM | POA: Diagnosis not present

## 2020-03-30 DIAGNOSIS — J449 Chronic obstructive pulmonary disease, unspecified: Secondary | ICD-10-CM | POA: Diagnosis not present

## 2020-03-30 DIAGNOSIS — L02213 Cutaneous abscess of chest wall: Secondary | ICD-10-CM | POA: Diagnosis not present

## 2020-03-30 DIAGNOSIS — I4819 Other persistent atrial fibrillation: Secondary | ICD-10-CM | POA: Diagnosis not present

## 2020-03-30 DIAGNOSIS — I129 Hypertensive chronic kidney disease with stage 1 through stage 4 chronic kidney disease, or unspecified chronic kidney disease: Secondary | ICD-10-CM | POA: Diagnosis not present

## 2020-03-30 DIAGNOSIS — J9612 Chronic respiratory failure with hypercapnia: Secondary | ICD-10-CM | POA: Diagnosis not present

## 2020-03-30 DIAGNOSIS — N183 Chronic kidney disease, stage 3 unspecified: Secondary | ICD-10-CM | POA: Diagnosis not present

## 2020-03-30 DIAGNOSIS — E113299 Type 2 diabetes mellitus with mild nonproliferative diabetic retinopathy without macular edema, unspecified eye: Secondary | ICD-10-CM | POA: Diagnosis not present

## 2020-03-30 DIAGNOSIS — E1122 Type 2 diabetes mellitus with diabetic chronic kidney disease: Secondary | ICD-10-CM | POA: Diagnosis not present

## 2020-03-30 DIAGNOSIS — J9611 Chronic respiratory failure with hypoxia: Secondary | ICD-10-CM | POA: Diagnosis not present

## 2020-03-31 ENCOUNTER — Telehealth: Payer: Self-pay

## 2020-03-31 NOTE — Telephone Encounter (Signed)
[facility] has called into office stating patients daughter has called their office requesting home consult due to patients decline. [facility] will be going to the home Monday or Tuesday of next week. Are there orders that need to be signed? Please advise

## 2020-04-02 DIAGNOSIS — J438 Other emphysema: Secondary | ICD-10-CM | POA: Diagnosis not present

## 2020-04-02 DIAGNOSIS — Z993 Dependence on wheelchair: Secondary | ICD-10-CM | POA: Diagnosis not present

## 2020-04-03 ENCOUNTER — Telehealth: Payer: Self-pay | Admitting: Family Medicine

## 2020-04-03 DIAGNOSIS — E1165 Type 2 diabetes mellitus with hyperglycemia: Secondary | ICD-10-CM | POA: Diagnosis not present

## 2020-04-03 DIAGNOSIS — E1121 Type 2 diabetes mellitus with diabetic nephropathy: Secondary | ICD-10-CM | POA: Diagnosis not present

## 2020-04-03 DIAGNOSIS — I10 Essential (primary) hypertension: Secondary | ICD-10-CM | POA: Diagnosis not present

## 2020-04-03 DIAGNOSIS — I959 Hypotension, unspecified: Secondary | ICD-10-CM | POA: Diagnosis not present

## 2020-04-03 DIAGNOSIS — J9622 Acute and chronic respiratory failure with hypercapnia: Secondary | ICD-10-CM | POA: Diagnosis not present

## 2020-04-03 DIAGNOSIS — E1122 Type 2 diabetes mellitus with diabetic chronic kidney disease: Secondary | ICD-10-CM | POA: Diagnosis not present

## 2020-04-03 DIAGNOSIS — F0390 Unspecified dementia without behavioral disturbance: Secondary | ICD-10-CM | POA: Diagnosis not present

## 2020-04-03 DIAGNOSIS — R4182 Altered mental status, unspecified: Secondary | ICD-10-CM | POA: Diagnosis not present

## 2020-04-03 DIAGNOSIS — I999 Unspecified disorder of circulatory system: Secondary | ICD-10-CM | POA: Diagnosis not present

## 2020-04-03 DIAGNOSIS — J9602 Acute respiratory failure with hypercapnia: Secondary | ICD-10-CM | POA: Diagnosis not present

## 2020-04-03 DIAGNOSIS — J449 Chronic obstructive pulmonary disease, unspecified: Secondary | ICD-10-CM | POA: Diagnosis not present

## 2020-04-03 DIAGNOSIS — J42 Unspecified chronic bronchitis: Secondary | ICD-10-CM | POA: Diagnosis not present

## 2020-04-03 DIAGNOSIS — R410 Disorientation, unspecified: Secondary | ICD-10-CM | POA: Diagnosis not present

## 2020-04-03 DIAGNOSIS — Z7409 Other reduced mobility: Secondary | ICD-10-CM | POA: Diagnosis not present

## 2020-04-03 DIAGNOSIS — G9340 Encephalopathy, unspecified: Secondary | ICD-10-CM | POA: Diagnosis not present

## 2020-04-03 DIAGNOSIS — E119 Type 2 diabetes mellitus without complications: Secondary | ICD-10-CM | POA: Diagnosis not present

## 2020-04-03 DIAGNOSIS — I129 Hypertensive chronic kidney disease with stage 1 through stage 4 chronic kidney disease, or unspecified chronic kidney disease: Secondary | ICD-10-CM | POA: Diagnosis not present

## 2020-04-03 DIAGNOSIS — I2510 Atherosclerotic heart disease of native coronary artery without angina pectoris: Secondary | ICD-10-CM | POA: Diagnosis not present

## 2020-04-03 DIAGNOSIS — R0902 Hypoxemia: Secondary | ICD-10-CM | POA: Diagnosis not present

## 2020-04-03 DIAGNOSIS — E079 Disorder of thyroid, unspecified: Secondary | ICD-10-CM | POA: Diagnosis not present

## 2020-04-03 DIAGNOSIS — R0689 Other abnormalities of breathing: Secondary | ICD-10-CM | POA: Diagnosis not present

## 2020-04-03 DIAGNOSIS — Z794 Long term (current) use of insulin: Secondary | ICD-10-CM | POA: Diagnosis not present

## 2020-04-03 DIAGNOSIS — R4020 Unspecified coma: Secondary | ICD-10-CM | POA: Diagnosis not present

## 2020-04-03 DIAGNOSIS — J9621 Acute and chronic respiratory failure with hypoxia: Secondary | ICD-10-CM | POA: Diagnosis not present

## 2020-04-03 DIAGNOSIS — N1832 Chronic kidney disease, stage 3b: Secondary | ICD-10-CM | POA: Diagnosis not present

## 2020-04-03 DIAGNOSIS — I6782 Cerebral ischemia: Secondary | ICD-10-CM | POA: Diagnosis not present

## 2020-04-03 DIAGNOSIS — F05 Delirium due to known physiological condition: Secondary | ICD-10-CM | POA: Diagnosis not present

## 2020-04-03 NOTE — Telephone Encounter (Signed)
Theresa Huang from home health called to let PCP know that their staff is no longer allowed to be on the property since a snake was in the house. He was letting the office know. He talked with the daughter and the daughter is not aware when someone will have to come out ([employer]) to get reports that snakes have been cleared.  PCP is aware. No other concerns.

## 2020-04-03 NOTE — Telephone Encounter (Signed)
[place] for verbal orders for Hospice

## 2020-04-03 NOTE — Telephone Encounter (Signed)
[facility] contacted verbal orders given on voicemail.

## 2020-04-04 DIAGNOSIS — Z515 Encounter for palliative care: Secondary | ICD-10-CM | POA: Diagnosis not present

## 2020-04-04 DIAGNOSIS — J449 Chronic obstructive pulmonary disease, unspecified: Secondary | ICD-10-CM | POA: Diagnosis not present

## 2020-04-04 DIAGNOSIS — J9621 Acute and chronic respiratory failure with hypoxia: Secondary | ICD-10-CM | POA: Diagnosis not present

## 2020-04-04 DIAGNOSIS — I4891 Unspecified atrial fibrillation: Secondary | ICD-10-CM | POA: Diagnosis not present

## 2020-04-04 DIAGNOSIS — R5381 Other malaise: Secondary | ICD-10-CM | POA: Diagnosis not present

## 2020-04-04 DIAGNOSIS — Z8673 Personal history of transient ischemic attack (TIA), and cerebral infarction without residual deficits: Secondary | ICD-10-CM | POA: Diagnosis not present

## 2020-04-04 DIAGNOSIS — Z7189 Other specified counseling: Secondary | ICD-10-CM | POA: Diagnosis not present

## 2020-04-04 DIAGNOSIS — J9602 Acute respiratory failure with hypercapnia: Secondary | ICD-10-CM | POA: Diagnosis not present

## 2020-04-04 DIAGNOSIS — J9622 Acute and chronic respiratory failure with hypercapnia: Secondary | ICD-10-CM | POA: Diagnosis not present

## 2020-04-05 DIAGNOSIS — F09 Unspecified mental disorder due to known physiological condition: Secondary | ICD-10-CM | POA: Diagnosis not present

## 2020-04-05 DIAGNOSIS — I10 Essential (primary) hypertension: Secondary | ICD-10-CM | POA: Diagnosis not present

## 2020-04-05 DIAGNOSIS — J449 Chronic obstructive pulmonary disease, unspecified: Secondary | ICD-10-CM | POA: Diagnosis not present

## 2020-04-05 DIAGNOSIS — G9341 Metabolic encephalopathy: Secondary | ICD-10-CM | POA: Diagnosis not present

## 2020-04-05 DIAGNOSIS — J9602 Acute respiratory failure with hypercapnia: Secondary | ICD-10-CM | POA: Diagnosis not present

## 2020-04-05 DIAGNOSIS — N1832 Chronic kidney disease, stage 3b: Secondary | ICD-10-CM | POA: Diagnosis not present

## 2020-04-06 DIAGNOSIS — Z7189 Other specified counseling: Secondary | ICD-10-CM | POA: Diagnosis not present

## 2020-04-06 DIAGNOSIS — R5381 Other malaise: Secondary | ICD-10-CM | POA: Diagnosis not present

## 2020-04-06 DIAGNOSIS — F09 Unspecified mental disorder due to known physiological condition: Secondary | ICD-10-CM | POA: Diagnosis not present

## 2020-04-06 DIAGNOSIS — I10 Essential (primary) hypertension: Secondary | ICD-10-CM | POA: Diagnosis not present

## 2020-04-06 DIAGNOSIS — J9602 Acute respiratory failure with hypercapnia: Secondary | ICD-10-CM | POA: Diagnosis not present

## 2020-04-06 DIAGNOSIS — Z8673 Personal history of transient ischemic attack (TIA), and cerebral infarction without residual deficits: Secondary | ICD-10-CM | POA: Diagnosis not present

## 2020-04-06 DIAGNOSIS — J9621 Acute and chronic respiratory failure with hypoxia: Secondary | ICD-10-CM | POA: Diagnosis not present

## 2020-04-06 DIAGNOSIS — G9341 Metabolic encephalopathy: Secondary | ICD-10-CM | POA: Diagnosis not present

## 2020-04-06 DIAGNOSIS — Z515 Encounter for palliative care: Secondary | ICD-10-CM | POA: Diagnosis not present

## 2020-04-06 DIAGNOSIS — J9622 Acute and chronic respiratory failure with hypercapnia: Secondary | ICD-10-CM | POA: Diagnosis not present

## 2020-04-06 DIAGNOSIS — J449 Chronic obstructive pulmonary disease, unspecified: Secondary | ICD-10-CM | POA: Diagnosis not present

## 2020-04-06 DIAGNOSIS — N1832 Chronic kidney disease, stage 3b: Secondary | ICD-10-CM | POA: Diagnosis not present

## 2020-04-06 DIAGNOSIS — I4891 Unspecified atrial fibrillation: Secondary | ICD-10-CM | POA: Diagnosis not present

## 2020-04-07 DIAGNOSIS — Z515 Encounter for palliative care: Secondary | ICD-10-CM | POA: Diagnosis not present

## 2020-04-07 DIAGNOSIS — I4891 Unspecified atrial fibrillation: Secondary | ICD-10-CM | POA: Diagnosis not present

## 2020-04-07 DIAGNOSIS — J9622 Acute and chronic respiratory failure with hypercapnia: Secondary | ICD-10-CM | POA: Diagnosis not present

## 2020-04-07 DIAGNOSIS — N1832 Chronic kidney disease, stage 3b: Secondary | ICD-10-CM | POA: Diagnosis not present

## 2020-04-07 DIAGNOSIS — Z7189 Other specified counseling: Secondary | ICD-10-CM | POA: Diagnosis not present

## 2020-04-07 DIAGNOSIS — J449 Chronic obstructive pulmonary disease, unspecified: Secondary | ICD-10-CM | POA: Diagnosis not present

## 2020-04-07 DIAGNOSIS — R5381 Other malaise: Secondary | ICD-10-CM | POA: Diagnosis not present

## 2020-04-07 DIAGNOSIS — I10 Essential (primary) hypertension: Secondary | ICD-10-CM | POA: Diagnosis not present

## 2020-04-07 DIAGNOSIS — G9341 Metabolic encephalopathy: Secondary | ICD-10-CM | POA: Diagnosis not present

## 2020-04-07 DIAGNOSIS — F09 Unspecified mental disorder due to known physiological condition: Secondary | ICD-10-CM | POA: Diagnosis not present

## 2020-04-07 DIAGNOSIS — J9602 Acute respiratory failure with hypercapnia: Secondary | ICD-10-CM | POA: Diagnosis not present

## 2020-04-07 DIAGNOSIS — J9621 Acute and chronic respiratory failure with hypoxia: Secondary | ICD-10-CM | POA: Diagnosis not present

## 2020-04-07 DIAGNOSIS — Z8673 Personal history of transient ischemic attack (TIA), and cerebral infarction without residual deficits: Secondary | ICD-10-CM | POA: Diagnosis not present

## 2020-04-08 DIAGNOSIS — I959 Hypotension, unspecified: Secondary | ICD-10-CM | POA: Diagnosis not present

## 2020-04-08 DIAGNOSIS — F09 Unspecified mental disorder due to known physiological condition: Secondary | ICD-10-CM | POA: Diagnosis not present

## 2020-04-08 DIAGNOSIS — Z7401 Bed confinement status: Secondary | ICD-10-CM | POA: Diagnosis not present

## 2020-04-08 DIAGNOSIS — I10 Essential (primary) hypertension: Secondary | ICD-10-CM | POA: Diagnosis not present

## 2020-04-08 DIAGNOSIS — N1832 Chronic kidney disease, stage 3b: Secondary | ICD-10-CM | POA: Diagnosis not present

## 2020-04-08 DIAGNOSIS — J449 Chronic obstructive pulmonary disease, unspecified: Secondary | ICD-10-CM | POA: Diagnosis not present

## 2020-04-08 DIAGNOSIS — J9602 Acute respiratory failure with hypercapnia: Secondary | ICD-10-CM | POA: Diagnosis not present

## 2020-04-08 DIAGNOSIS — G9341 Metabolic encephalopathy: Secondary | ICD-10-CM | POA: Diagnosis not present

## 2020-04-08 DIAGNOSIS — M2550 Pain in unspecified joint: Secondary | ICD-10-CM | POA: Diagnosis not present

## 2020-04-08 DIAGNOSIS — R404 Transient alteration of awareness: Secondary | ICD-10-CM | POA: Diagnosis not present

## 2020-04-21 ENCOUNTER — Telehealth: Payer: Self-pay | Admitting: Family Medicine

## 2020-04-21 NOTE — Telephone Encounter (Signed)
Please call [facility]/hospice.  I reviewed the plan of care update that they fax to me.  I would like for them to discontinue her amantadine and alendronate.  Also okay to decrease sertraline to 25 mg daily for 2 weeks, and then discontinue completely.  Also please clarify.  If she is taking mirtazapine for sleep then lets have her discontinue the melatonin.  Also okay to discontinue Lantus.  She is only using 10 units daily which is very minimal.  I suspect her p.o. intake is low.

## 2020-04-24 ENCOUNTER — Other Ambulatory Visit: Payer: Self-pay

## 2020-04-24 DIAGNOSIS — L02213 Cutaneous abscess of chest wall: Secondary | ICD-10-CM

## 2020-04-24 MED ORDER — HYDROCODONE-ACETAMINOPHEN 5-325 MG PO TABS: 1.0000 | ORAL_TABLET | Freq: Three times a day (TID) | ORAL | 0 refills | Status: AC | PRN

## 2020-04-24 NOTE — Telephone Encounter (Signed)
Case manager from [facility] asking RF be sent. Last written 03/16/20

## 2020-04-24 NOTE — Telephone Encounter (Signed)
Called [facility] and Nevin Bloodgood stated that she will have a case manager to return call regarding updated medication changes.

## 2020-04-24 NOTE — Telephone Encounter (Signed)
FYI- contact for Reymundo Poll is [phone]

## 2020-04-24 NOTE — Telephone Encounter (Signed)
Spoke with Reymundo Poll, medication changes have been addressed.

## 2020-04-29 DIAGNOSIS — J449 Chronic obstructive pulmonary disease, unspecified: Secondary | ICD-10-CM | POA: Diagnosis not present

## 2020-05-03 DIAGNOSIS — J438 Other emphysema: Secondary | ICD-10-CM | POA: Diagnosis not present

## 2020-05-03 DIAGNOSIS — Z993 Dependence on wheelchair: Secondary | ICD-10-CM | POA: Diagnosis not present

## 2020-05-05 ENCOUNTER — Ambulatory Visit: Payer: Medicare HMO | Admitting: Family Medicine

## 2020-05-09 ENCOUNTER — Telehealth: Payer: Self-pay

## 2020-05-17 NOTE — Telephone Encounter (Signed)
OK, thank you.

## 2020-05-17 NOTE — Telephone Encounter (Signed)
Reymundo Poll, RN with [facility], called to give update. She states Elliannah has declined. She has stopped eating and drinking. She is now only on morphine.

## 2020-05-17 DEATH — deceased
